# Patient Record
Sex: Female | Born: 1970
Health system: Southern US, Community
[De-identification: ages and names within clinical notes are randomized; demographics above are authoritative.]

## PROBLEM LIST (undated history)

## (undated) DIAGNOSIS — E785 Hyperlipidemia, unspecified: Secondary | ICD-10-CM

## (undated) DIAGNOSIS — K649 Unspecified hemorrhoids: Secondary | ICD-10-CM

## (undated) DIAGNOSIS — Z309 Encounter for contraceptive management, unspecified: Secondary | ICD-10-CM

## (undated) DIAGNOSIS — I10 Essential (primary) hypertension: Secondary | ICD-10-CM

## (undated) DIAGNOSIS — Z0000 Encounter for general adult medical examination without abnormal findings: Secondary | ICD-10-CM

## (undated) DIAGNOSIS — K219 Gastro-esophageal reflux disease without esophagitis: Secondary | ICD-10-CM

## (undated) DIAGNOSIS — T7840XA Allergy, unspecified, initial encounter: Secondary | ICD-10-CM

## (undated) DIAGNOSIS — R739 Hyperglycemia, unspecified: Secondary | ICD-10-CM

## (undated) DIAGNOSIS — Z8632 Personal history of gestational diabetes: Secondary | ICD-10-CM

## (undated) HISTORY — DX: Gastro-esophageal reflux disease without esophagitis: K21.9

## (undated) HISTORY — DX: Personal history of gestational diabetes: Z86.32

## (undated) HISTORY — DX: Allergy, unspecified, initial encounter: T78.40XA

## (undated) HISTORY — DX: Encounter for general adult medical examination without abnormal findings: Z00.00

## (undated) HISTORY — DX: Encounter for contraceptive management, unspecified: Z30.9

## (undated) HISTORY — DX: Unspecified hemorrhoids: K64.9

## (undated) HISTORY — PX: NO PAST SURGERIES: SHX2092

## (undated) HISTORY — DX: Hyperlipidemia, unspecified: E78.5

## (undated) HISTORY — DX: Hyperglycemia, unspecified: R73.9

## (undated) HISTORY — DX: Essential (primary) hypertension: I10

---

## 2005-09-20 ENCOUNTER — Other Ambulatory Visit: Admission: RE | Admit: 2005-09-20 | Discharge: 2005-09-20 | Payer: Self-pay | Admitting: Family Medicine

## 2006-09-12 ENCOUNTER — Other Ambulatory Visit: Admission: RE | Admit: 2006-09-12 | Discharge: 2006-09-12 | Payer: Self-pay | Admitting: Obstetrics and Gynecology

## 2007-01-12 ENCOUNTER — Encounter: Admission: RE | Admit: 2007-01-12 | Discharge: 2007-04-12 | Payer: Self-pay | Admitting: Obstetrics and Gynecology

## 2007-02-23 ENCOUNTER — Inpatient Hospital Stay (HOSPITAL_COMMUNITY): Admission: AD | Admit: 2007-02-23 | Discharge: 2007-02-25 | Payer: Self-pay | Admitting: Obstetrics and Gynecology

## 2007-08-10 ENCOUNTER — Ambulatory Visit (HOSPITAL_COMMUNITY): Admission: RE | Admit: 2007-08-10 | Discharge: 2007-08-10 | Payer: Self-pay | Admitting: Occupational Medicine

## 2011-03-12 NOTE — H&P (Signed)
NAMEVIRGIA, Vickie Thompson                ACCOUNT NO.:  [id number]   MEDICAL RECORD NO.:  [id number]          PATIENT TYPE:  INP   LOCATION:  9161                          FACILITY:  [facility]   PHYSICIAN:  Hal Morales, M.D.DATE OF BIRTH:  [birth year]   DATE OF ADMISSION:  02/23/2007  DATE OF DISCHARGE:                              HISTORY & PHYSICAL   This 40 year old gravida 2, para 1-0-0-1 at 38-1/7 weeks who presents  for induction of labor for oligohydramnios.  She had decelerations on  her NST in the office and although her BPP score was 8/8, she showed an  AFI of the 5th percentile.  Her induction of labor was originally  scheduled for tomorrow.  Pregnancy has been followed by Dr. Stefano Gaul and  remarkable for:  1. Unsure dates.  2. AMA.  3. Gestational diabetes.  4. Group B Strep positive.   ALLERGIES:  SUDAFED causes heart racing.   OBSTETRICAL HISTORY:  Vaginal delivery in 1999 of a female infant at [redacted]  weeks gestation weighing 8 pounds 5 ounces with no complications.   PAST MEDICAL HISTORY:  1. Varicella as a Archivist.  2. Hepatitis A as a child.   PAST SURGICAL HISTORY:  Negative.   FAMILY HISTORY:  Remarkable for a grandmother with hypertension.  Daughter with tuberculosis at age 56 which was treated.  Father with  diabetes.   GENETIC HISTORY:  Remarkable for father of the baby's sister with twins.   SOCIAL HISTORY:  Patient is married to Quay Burow who is involved and  supportive.  She is of the [employer].  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  She denies any alcohol, tobacco or drug use.   PRENATAL LABORATORY DATA:  Hemoglobin 12.9, platelets 286.  Blood type O  positive.  Antibody screen negative.  Sickle cell negative.  RPR  nonreactive.  Rubella immune.  Hepatitis negative.  Pap test normal.  Gonorrhea negative.  Chlamydia negative.   HISTORY OF CURRENT PREGNANCY:  Patient entered care at [redacted] weeks  gestation.  She had an ultrasound  for dates which showed [redacted] weeks  gestation, otherwise was normal.  She declined an amniocentesis.  She  had a Glucola at 26 weeks which was normal.  She had an elevated  Dextrostick at 29 weeks of 236 and was ordered to have two hour fasting  blood sugars.  Fasting blood sugar was 136 and two hour postprandials  were 94-114.  Therefore, gestational diabetes was suspected.  She was  sent to [facility]. [facility] for diet and  insulin teaching.  She had an ultrasound at 30 weeks showing 95 to 96%  growth and normal fluid.  She was placed on 5 units of NPH insulin  nightly.  Fasting blood sugars then ranged 78-81 with two-hour PCs 85-  124.  Ultrasound at 33 weeks showed 83% growth and normal fluid.  BPP  was 8.8.  Blood sugars remained in good control after that.  She had an  ultrasound at 36 weeks showing 84 percentile growth with normal fluid.  Induction was scheduled.  Group B Strep was positive at that time.   OBJECTIVE:  VITAL SIGNS:  Stable, afebrile.  HEENT:  Within normal limits.  NECK:  Thyroid normal, not enlarged.  CHEST:  Clear to auscultation.  CARDIOVASCULAR:  Regular rate and rhythm.  ABDOMEN:  Gravid 39 cm, vertex to Leopold's.  CFM shows reactive fetal  heart rate with contractions every five minutes which are mild.  PELVIC:  Cervix in the office was 2, 75 and high per Dr. Stefano Gaul.  EXTREMITIES:  Within normal limits.   ASSESSMENT:  1. Intrauterine pregnancy at 38-1/7 weeks.  2. Gestational diabetes.  3. Oligohydramnios.   PLAN:  1. Admit to birthing suits per Dr. Stefano Gaul and Dr. Pennie Rushing.  2. Routine MD orders.  3. Cervidil and then pitocin in the a.m.  4. Blood glucose monitoring with NPH insulin 5 units tonight and      further just to follow.      Marie L. Williams, C.N.M.      Hal Morales, M.D.  Electronically Signed    MLW/MEDQ  D:  02/23/2007  T:  02/23/2007  Job:  [id number]

## 2011-12-27 ENCOUNTER — Telehealth: Payer: Self-pay | Admitting: Internal Medicine

## 2011-12-27 NOTE — Telephone Encounter (Signed)
Pt needs a call to see if her physical be done on her first visit; pt has an appointment on 02/07/2012 at 945; she was advise a phone will be made to her to make this determination. If you are unable to reach her at home please call pt on her cell phone: [phone]

## 2011-12-27 NOTE — Telephone Encounter (Signed)
Left message for Jericca to return call to the office

## 2011-12-27 NOTE — Telephone Encounter (Signed)
Spoke with Vickie Thompson.  She denies any major medical problems, takes only multivitamin and OCP daily.  Would need pap and mammogram scheduled day of appt.  Aware okay for CPE day of 1st visit

## 2012-02-07 ENCOUNTER — Ambulatory Visit (HOSPITAL_BASED_OUTPATIENT_CLINIC_OR_DEPARTMENT_OTHER)
Admission: RE | Admit: 2012-02-07 | Discharge: 2012-02-07 | Disposition: A | Discharge status: Home / Self Care | Payer: 59 | Source: Ambulatory Visit | Attending: Internal Medicine | Admitting: Internal Medicine

## 2012-02-07 ENCOUNTER — Encounter: Payer: Self-pay | Admitting: Internal Medicine

## 2012-02-07 ENCOUNTER — Other Ambulatory Visit: Payer: Self-pay | Admitting: Internal Medicine

## 2012-02-07 ENCOUNTER — Ambulatory Visit (INDEPENDENT_AMBULATORY_CARE_PROVIDER_SITE_OTHER): Payer: 59 | Admitting: Internal Medicine

## 2012-02-07 VITALS — BP 111/72 | HR 86 | Temp 99.2°F | Ht 60.5 in | Wt 124.2 lb

## 2012-02-07 DIAGNOSIS — E785 Hyperlipidemia, unspecified: Secondary | ICD-10-CM

## 2012-02-07 DIAGNOSIS — Z124 Encounter for screening for malignant neoplasm of cervix: Secondary | ICD-10-CM

## 2012-02-07 DIAGNOSIS — Z139 Encounter for screening, unspecified: Secondary | ICD-10-CM

## 2012-02-07 DIAGNOSIS — Z1231 Encounter for screening mammogram for malignant neoplasm of breast: Secondary | ICD-10-CM

## 2012-02-07 DIAGNOSIS — Z1151 Encounter for screening for human papillomavirus (HPV): Secondary | ICD-10-CM

## 2012-02-07 DIAGNOSIS — Z8632 Personal history of gestational diabetes: Secondary | ICD-10-CM | POA: Insufficient documentation

## 2012-02-07 LAB — POCT URINALYSIS DIPSTICK
Glucose, UA: NEGATIVE
Ketones, UA: NEGATIVE
Leukocytes, UA: NEGATIVE
Spec Grav, UA: 1.01
Urobilinogen, UA: 0.2
pH, UA: 7.5

## 2012-02-07 LAB — COMPREHENSIVE METABOLIC PANEL
ALT: 12 U/L (ref 0–35)
AST: 18 U/L (ref 0–37)
Albumin: 4.5 g/dL (ref 3.5–5.2)
Chloride: 102 mEq/L (ref 96–112)
Potassium: 3.9 mEq/L (ref 3.5–5.3)
Sodium: 139 mEq/L (ref 135–145)

## 2012-02-07 LAB — CBC WITH DIFFERENTIAL/PLATELET
Eosinophils Relative: 1 % (ref 0–5)
HCT: 41.6 % (ref 36.0–46.0)
Hemoglobin: 13.5 g/dL (ref 12.0–15.0)
Lymphocytes Relative: 20 % (ref 12–46)
Lymphs Abs: 2.3 10*3/uL (ref 0.7–4.0)
MCHC: 32.5 g/dL (ref 30.0–36.0)
Monocytes Absolute: 1 10*3/uL (ref 0.1–1.0)
Neutrophils Relative %: 71 % (ref 43–77)
RBC: 4.56 MIL/uL (ref 3.87–5.11)
RDW: 13.2 % (ref 11.5–15.5)

## 2012-02-07 LAB — LIPID PANEL
LDL Cholesterol: 143 mg/dL — ABNORMAL HIGH (ref 0–99)
VLDL: 25 mg/dL (ref 0–40)

## 2012-02-07 LAB — TSH: TSH: 2.143 u[IU]/mL (ref 0.350–4.500)

## 2012-02-07 NOTE — Patient Instructions (Signed)
Labs will be mailed to you  Return prn 

## 2012-02-07 NOTE — Progress Notes (Addendum)
Subjective:    Patient ID: Vickie Thompson, female    DOB: 1971-08-09, 41 y.o.   MRN: [id number]  HPI Vickie Thompson is a new patient here for her first visit. Former primary care by Dr. Celene Skeen states. Past medical history of hyperlipidemia and gestational diabetes and otherwise healthy. She would like a complete physical exam today. She has no complaints but is worried that her cholesterol may be high and it has not been checked in a while.  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  Allergies  Allergen Reactions  . Retinoids Shortness Of Breath  . Sudafed (Pseudoephedrine Hcl) Palpitations   Past Medical History  Diagnosis Date  . Hyperlipidemia   . Gestational diabetes    History reviewed. No pertinent past surgical history. History   Social History  . Marital Status: Married    Spouse Name: N/A    Number of Children: N/A  . Years of Education: N/A   Occupational History  . Not on file.   Social History Main Topics  . Smoking status: Never Smoker   . Smokeless tobacco: Never Used  . Alcohol Use: No  . Drug Use: No  . Sexually Active: Yes    Birth Control/ Protection: Pill   Other Topics Concern  . Not on file   Social History Narrative  . No narrative on file   Family History  Problem Relation Age of Onset  . Diabetes Father    Patient Active Problem List  Diagnoses  . Hyperlipidemia  . Gestational diabetes   No current outpatient prescriptions on file prior to visit.        Review of Systems  Constitutional: Negative.   HENT: Negative.   Eyes: Negative.   Respiratory: Negative.   Cardiovascular: Negative.   Gastrointestinal: Negative.   Genitourinary: Negative.   Musculoskeletal: Negative.   Skin: Negative.   Neurological: Negative.   Hematological: Negative.        Objective:   Physical Exam  Physical Exam  Vital signs and nursing note reviewed  Constitutional: She is oriented to person, place, and time. She appears  well-developed and well-nourished. She is cooperative.  HENT:  Head: Normocephalic and atraumatic.  Right Ear: Tympanic membrane normal.  Left Ear: Tympanic membrane normal.  Nose: Nose normal.  Mouth/Throat: Oropharynx is clear and moist and mucous membranes are normal. No oropharyngeal exudate or posterior oropharyngeal erythema.  Eyes: Conjunctivae and EOM are normal. Pupils are equal, round, and reactive to light.  Neck: Neck supple. No JVD present. Carotid bruit is not present. No mass and no thyromegaly present.  Cardiovascular: Regular rhythm, normal heart sounds, intact distal pulses and normal pulses.  Exam reveals no gallop and no friction rub.   No murmur heard. Pulses:      Dorsalis pedis pulses are 2+ on the right side, and 2+ on the left side.  Pulmonary/Chest: Breath sounds normal. She has no wheezes. She has no rhonchi. She has no rales. Right breast exhibits no mass, no nipple discharge and no skin change. Left breast exhibits no mass, no nipple discharge and no skin change.  Abdominal: Soft. Bowel sounds are normal. She exhibits no distension and no mass. There is no hepatosplenomegaly. There is no tenderness. There is no CVA tenderness.  Genitourinary: Rectum normal, vagina normal and uterus normal. No labial fusion. There is no lesion on the right labia. There is no lesion on the left labia. Cervix exhibits no motion tenderness. Right adnexum displays no mass, no  tenderness and no fullness. Left adnexum displays no mass, no tenderness and no fullness. No erythema around the vagina.  Musculoskeletal:       No active synovitis to any joint.    Lymphadenopathy:       Right cervical: No superficial cervical adenopathy present.      Left cervical: No superficial cervical adenopathy present.       Right axillary: No pectoral and no lateral adenopathy present.       Left axillary: No pectoral and no lateral adenopathy present.      Right: No inguinal adenopathy present.        Left: No inguinal adenopathy present.  Neurological: She is alert and oriented to person, place, and time. She has normal strength and normal reflexes. No cranial nerve deficit or sensory deficit. She displays a negative Romberg sign. Coordination and gait normal.  Skin: Skin is warm and dry. No abrasion, no bruising, no ecchymosis and no rash noted. No cyanosis. Nails show no clubbing.  Psychiatric: She has a normal mood and affect. Her speech is normal and behavior is normal.          Assessment & Plan:   #1) health maintenance CT scan health maintenance sheet. She is up to date on her TD and was given a copy of-diet.  #2) hyperlipidemia will check lipids with baseline chemistry CBC thyroid and vitamin D daily labs will be mailed to her  She is to come back as needed. Addendum :  See lipids  Old chart LDL 191 in 04/2010  151 09/2009,  Vitamin D 09/2009 13      Assessment & Plan:

## 2012-02-08 LAB — VITAMIN D 25 HYDROXY (VIT D DEFICIENCY, FRACTURES): Vit D, 25-Hydroxy: 49 ng/mL (ref 30–89)

## 2012-02-09 ENCOUNTER — Telehealth: Payer: Self-pay | Admitting: *Deleted

## 2012-02-09 NOTE — Telephone Encounter (Signed)
LM for pt to call and schedule appt to discuss the results of her Cholesterol level and intervention.  A copy of labs mailed to pt.

## 2012-02-14 ENCOUNTER — Encounter: Payer: Self-pay | Admitting: *Deleted

## 2012-02-20 ENCOUNTER — Encounter: Payer: Self-pay | Admitting: Internal Medicine

## 2012-02-20 DIAGNOSIS — E559 Vitamin D deficiency, unspecified: Secondary | ICD-10-CM | POA: Insufficient documentation

## 2012-02-20 HISTORY — DX: Vitamin D deficiency, unspecified: E55.9

## 2012-02-24 ENCOUNTER — Ambulatory Visit (INDEPENDENT_AMBULATORY_CARE_PROVIDER_SITE_OTHER): Payer: 59 | Admitting: Internal Medicine

## 2012-02-24 ENCOUNTER — Encounter: Payer: Self-pay | Admitting: Internal Medicine

## 2012-02-24 VITALS — BP 100/60 | HR 83 | Temp 97.5°F | Resp 16 | Ht 65.0 in | Wt 126.0 lb

## 2012-02-24 DIAGNOSIS — E785 Hyperlipidemia, unspecified: Secondary | ICD-10-CM

## 2012-02-24 DIAGNOSIS — E559 Vitamin D deficiency, unspecified: Secondary | ICD-10-CM

## 2012-02-24 NOTE — Patient Instructions (Signed)
See me as needed 

## 2012-02-24 NOTE — Progress Notes (Signed)
  Subjective:    Patient ID: Vickie Thompson, female    DOB: August 17, 1971, 41 y.o.   MRN: [id number]  HPI [name] is here to follow up on her cholesterol.  She denies FH of MI, CAD or hyperlipidemia.    She does not smoke and is not diabetic  Framingham risk score is 1.6%  Reviewed with pt  She is taking her vitamin D Allergies  Allergen Reactions  . Retinoids Shortness Of Breath  . Sudafed (Pseudoephedrine Hcl) Palpitations   Past Medical History  Diagnosis Date  . Hyperlipidemia   . Gestational diabetes    History reviewed. No pertinent past surgical history. History   Social History  . Marital Status: Married    Spouse Name: N/A    Number of Children: N/A  . Years of Education: N/A   Occupational History  . Not on file.   Social History Main Topics  . Smoking status: Never Smoker   . Smokeless tobacco: Never Used  . Alcohol Use: No  . Drug Use: No  . Sexually Active: Yes -- Female partner(s)    Birth Control/ Protection: Pill   Other Topics Concern  . Not on file   Social History Narrative  . No narrative on file   Family History  Problem Relation Age of Onset  . Diabetes Father    Patient Active Problem List  Diagnoses  . Hyperlipidemia  . Gestational diabetes  . Vitamin d deficiency   Current Outpatient Prescriptions on File Prior to Visit  Medication Sig Dispense Refill  . acetaminophen (TYLENOL) 500 MG tablet Take 500 mg by mouth every 6 (six) hours as needed.      Marland Kitchen PRESCRIPTION MEDICATION 1 tablet daily. Mercilon( birth control from [place])           Review of Systems    see HPI Objective:   Physical Exam Physical Exam  Nursing note and vitals reviewed.  Constitutional: She is oriented to person, place, and time. She appears well-developed and well-nourished.  HENT:  Head: Normocephalic and atraumatic.  Cardiovascular: Normal rate and regular rhythm. Exam reveals no gallop and no friction rub.  No murmur heard.  Pulmonary/Chest:  Breath sounds normal. She has no wheezes. She has no rales.  Neurological: She is alert and oriented to person, place, and time.  Skin: Skin is warm and dry.  Psychiatric: She has a normal mood and affect. Her behavior is normal.         Assessment & Plan:  1)  Hyperlipidemia  Mild  DASH diet given 2)  Vitmain D defiency  Continue supplement

## 2012-11-09 ENCOUNTER — Encounter: Payer: Self-pay | Admitting: Internal Medicine

## 2012-11-09 ENCOUNTER — Ambulatory Visit (INDEPENDENT_AMBULATORY_CARE_PROVIDER_SITE_OTHER): Payer: 59 | Admitting: Internal Medicine

## 2012-11-09 VITALS — BP 108/56 | HR 69 | Temp 97.4°F | Resp 16 | Wt 116.0 lb

## 2012-11-09 DIAGNOSIS — J069 Acute upper respiratory infection, unspecified: Secondary | ICD-10-CM

## 2012-11-09 DIAGNOSIS — J029 Acute pharyngitis, unspecified: Secondary | ICD-10-CM

## 2012-11-09 DIAGNOSIS — Z139 Encounter for screening, unspecified: Secondary | ICD-10-CM

## 2012-11-09 MED ORDER — AZITHROMYCIN 250 MG PO TABS: ORAL_TABLET | ORAL | Status: DC

## 2012-11-09 NOTE — Patient Instructions (Addendum)
See me as needed 

## 2012-11-09 NOTE — Progress Notes (Signed)
  Subjective:    Patient ID: Vickie Thompson, female    DOB: [birth year], 42 y.o.   MRN: [id number]  HPI  Prestyn is here for acute visit.  Had GI diarrhea that is resolved now but has sore throat and swollen lymph nodes in neck. NO fever no cough  Allergies  Allergen Reactions  . Retinoids Shortness Of Breath  . Sudafed (Pseudoephedrine Hcl) Palpitations   Past Medical History  Diagnosis Date  . Hyperlipidemia   . Gestational diabetes    History reviewed. No pertinent past surgical history. History   Social History  . Marital Status: Married    Spouse Name: N/A    Number of Children: N/A  . Years of Education: N/A   Occupational History  . Not on file.   Social History Main Topics  . Smoking status: Never Smoker   . Smokeless tobacco: Never Used  . Alcohol Use: No  . Drug Use: No  . Sexually Active: Yes -- Female partner(s)    Birth Control/ Protection: Pill   Other Topics Concern  . Not on file   Social History Narrative  . No narrative on file   Family History  Problem Relation Age of Onset  . Diabetes Father    Patient Active Problem List  Diagnosis  . Hyperlipidemia  . Gestational diabetes  . Vitamin d deficiency   Current Outpatient Prescriptions on File Prior to Visit  Medication Sig Dispense Refill  . acetaminophen (TYLENOL) 500 MG tablet Take 500 mg by mouth every 6 (six) hours as needed.      Marland Kitchen PRESCRIPTION MEDICATION 1 tablet daily. Mercilon( birth control from [place])         Review of Systems    see HPI Objective:   Physical Exam  Physical Exam  Constitutional: She is oriented to person, place, and time. She appears well-developed and well-nourished. She is cooperative.  HENT:  Head: Normocephalic and atraumatic.  Right Ear: A middle ear effusion is present.  Left Ear: A middle ear effusion is present.  Nose: Mucosal edema present.  Mouth/Throat: Oropharyngeal exudate and posterior oropharyngeal erythema present.  Serous  effusion bilaterally  Eyes: Conjunctivae and EOM are normal. Pupils are equal, round, and reactive to light.  Neck: Neck supple. Carotid bruit is not present. No mass present.  Cardiovascular: Regular rhythm, normal heart sounds, intact distal pulses and normal pulses. Exam reveals no gallop and no friction rub.  No murmur heard.  Pulmonary/Chest: Breath sounds normal. She has no wheezes. She has no rhonchi. She has no rales.  Lymphadenopathy:  She has cervical adenopathy.  Neurological: She is alert and oriented to person, place, and time.  Skin: Skin is warm and dry. No abrasion, no bruising, no ecchymosis and no rash noted. No cyanosis. Nails show no clubbing.  Psychiatric: She has a normal mood and affect. Her speech is normal and behavior is normal.           Assessment & Plan:  Pharyngitis  Will give Zpak  Call if not better  URI  See above

## 2012-11-21 ENCOUNTER — Telehealth: Payer: Self-pay | Admitting: *Deleted

## 2012-11-21 ENCOUNTER — Ambulatory Visit (HOSPITAL_BASED_OUTPATIENT_CLINIC_OR_DEPARTMENT_OTHER)
Admission: RE | Admit: 2012-11-21 | Discharge: 2012-11-21 | Disposition: A | Discharge status: Home / Self Care | Payer: 59 | Source: Ambulatory Visit | Attending: Internal Medicine | Admitting: Internal Medicine

## 2012-11-21 ENCOUNTER — Encounter: Payer: Self-pay | Admitting: Internal Medicine

## 2012-11-21 ENCOUNTER — Other Ambulatory Visit: Payer: Self-pay | Admitting: Internal Medicine

## 2012-11-21 ENCOUNTER — Ambulatory Visit (INDEPENDENT_AMBULATORY_CARE_PROVIDER_SITE_OTHER): Payer: 59 | Admitting: Internal Medicine

## 2012-11-21 DIAGNOSIS — M546 Pain in thoracic spine: Secondary | ICD-10-CM | POA: Insufficient documentation

## 2012-11-21 DIAGNOSIS — M549 Dorsalgia, unspecified: Secondary | ICD-10-CM | POA: Insufficient documentation

## 2012-11-21 DIAGNOSIS — R0781 Pleurodynia: Secondary | ICD-10-CM

## 2012-11-21 DIAGNOSIS — R079 Chest pain, unspecified: Secondary | ICD-10-CM | POA: Insufficient documentation

## 2012-11-21 MED ORDER — IBUPROFEN 800 MG PO TABS: 800.0000 mg | ORAL_TABLET | Freq: Three times a day (TID) | ORAL | Status: DC | PRN
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
## 2012-11-21 NOTE — Telephone Encounter (Signed)
Called patient to adv xrays are normal. Patient understood

## 2012-11-21 NOTE — Patient Instructions (Addendum)
To xray today  Ok to take Ibuprofen 800 mg q6h prn

## 2012-11-24 ENCOUNTER — Telehealth: Payer: Self-pay | Admitting: *Deleted

## 2012-11-24 NOTE — Telephone Encounter (Signed)
Pt states that she continues to have pain in her right chest area and back states that she is taking  ibuprofen 800 mg with no relief requesting something stronger for pain

## 2012-11-25 MED ORDER — TRAMADOL-ACETAMINOPHEN 37.5-325 MG PO TABS: 1.0000 | ORAL_TABLET | Freq: Four times a day (QID) | ORAL | Status: DC | PRN

## 2012-11-25 NOTE — Addendum Note (Signed)
Addended by: Raechel Chute D on: 11/25/2012 11:01 AM   Modules accepted: Orders

## 2012-11-25 NOTE — Telephone Encounter (Signed)
Called pt regarding request for meds .  Pt not available.  Left message on cell and home phone

## 2012-11-27 ENCOUNTER — Telehealth: Payer: Self-pay | Admitting: Internal Medicine

## 2012-11-27 NOTE — Telephone Encounter (Signed)
Pt was calling to have another pain medication prescribe to her.. She states her meds are not working for her.. Please call pt at [phone] she called on fri at 140 pm..

## 2012-11-27 NOTE — Telephone Encounter (Signed)
Spoke with pt.  OK for ultracet 37.5 q6h prn

## 2012-12-05 ENCOUNTER — Telehealth: Payer: Self-pay | Admitting: *Deleted

## 2012-12-13 NOTE — Telephone Encounter (Signed)
refill 

## 2013-02-12 ENCOUNTER — Encounter: Payer: 59 | Admitting: Internal Medicine

## 2013-06-18 ENCOUNTER — Ambulatory Visit (HOSPITAL_BASED_OUTPATIENT_CLINIC_OR_DEPARTMENT_OTHER)
Admission: RE | Admit: 2013-06-18 | Discharge: 2013-06-18 | Disposition: A | Discharge status: Home / Self Care | Payer: 59 | Source: Ambulatory Visit | Attending: Internal Medicine | Admitting: Internal Medicine

## 2013-06-18 ENCOUNTER — Ambulatory Visit (INDEPENDENT_AMBULATORY_CARE_PROVIDER_SITE_OTHER): Payer: 59 | Admitting: Internal Medicine

## 2013-06-18 ENCOUNTER — Encounter: Payer: Self-pay | Admitting: Internal Medicine

## 2013-06-18 VITALS — BP 110/70 | HR 99 | Temp 98.2°F | Resp 18 | Wt 124.0 lb

## 2013-06-18 DIAGNOSIS — E785 Hyperlipidemia, unspecified: Secondary | ICD-10-CM

## 2013-06-18 DIAGNOSIS — E559 Vitamin D deficiency, unspecified: Secondary | ICD-10-CM

## 2013-06-18 DIAGNOSIS — Z0000 Encounter for general adult medical examination without abnormal findings: Secondary | ICD-10-CM

## 2013-06-18 DIAGNOSIS — Z1231 Encounter for screening mammogram for malignant neoplasm of breast: Secondary | ICD-10-CM | POA: Insufficient documentation

## 2013-06-18 DIAGNOSIS — Z139 Encounter for screening, unspecified: Secondary | ICD-10-CM

## 2013-06-18 LAB — POCT URINALYSIS DIPSTICK
Glucose, UA: NEGATIVE
Leukocytes, UA: NEGATIVE
Nitrite, UA: NEGATIVE
Spec Grav, UA: 1.015
Urobilinogen, UA: NEGATIVE

## 2013-06-18 LAB — CBC WITH DIFFERENTIAL/PLATELET
Basophils Relative: 1 % (ref 0–1)
Eosinophils Relative: 18 % — ABNORMAL HIGH (ref 0–5)
Lymphs Abs: 2.1 10*3/uL (ref 0.7–4.0)
Monocytes Absolute: 0.3 10*3/uL (ref 0.1–1.0)
Monocytes Relative: 5 % (ref 3–12)
Neutrophils Relative %: 44 % (ref 43–77)
RBC: 4.47 MIL/uL (ref 3.87–5.11)
WBC: 6.5 10*3/uL (ref 4.0–10.5)

## 2013-06-18 NOTE — Patient Instructions (Addendum)
Activate my chart  See me as needed
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
## 2013-06-19 LAB — COMPREHENSIVE METABOLIC PANEL WITH GFR
ALT: 15 U/L (ref 0–35)
AST: 20 U/L (ref 0–37)
Albumin: 4.6 g/dL (ref 3.5–5.2)
Alkaline Phosphatase: 57 U/L (ref 39–117)
BUN: 15 mg/dL (ref 6–23)
CO2: 25 meq/L (ref 19–32)
Calcium: 9.4 mg/dL (ref 8.4–10.5)
Chloride: 104 meq/L (ref 96–112)
Creat: 0.61 mg/dL (ref 0.50–1.10)
Glucose, Bld: 81 mg/dL (ref 70–99)
Potassium: 3.8 meq/L (ref 3.5–5.3)
Sodium: 138 meq/L (ref 135–145)
Total Bilirubin: 0.4 mg/dL (ref 0.3–1.2)
Total Protein: 7.6 g/dL (ref 6.0–8.3)

## 2013-06-19 LAB — LIPID PANEL
Cholesterol: 248 mg/dL — ABNORMAL HIGH (ref 0–200)
HDL: 69 mg/dL
LDL Cholesterol: 158 mg/dL — ABNORMAL HIGH (ref 0–99)
Total CHOL/HDL Ratio: 3.6 ratio
Triglycerides: 104 mg/dL
VLDL: 21 mg/dL (ref 0–40)

## 2013-06-19 LAB — TSH: TSH: 2.446 u[IU]/mL (ref 0.350–4.500)

## 2013-06-26 ENCOUNTER — Telehealth: Payer: Self-pay | Admitting: *Deleted

## 2013-06-26 ENCOUNTER — Encounter: Payer: Self-pay | Admitting: *Deleted

## 2013-06-26 NOTE — Telephone Encounter (Signed)
Message copied by Mathews Robinsons on Tue Jun 26, 2013  3:36 PM ------      Message from: Raechel Chute D      Created: Sat Jun 23, 2013  9:36 PM       Karen Kitchens            Call pt and let her know that her cholesterol is slightly higher .  Tell her I need to see her in office to discuss options.  Give her a 30 min appt with me ------

## 2013-06-26 NOTE — Telephone Encounter (Signed)
appt made for Wed 9/10

## 2013-07-04 ENCOUNTER — Ambulatory Visit (INDEPENDENT_AMBULATORY_CARE_PROVIDER_SITE_OTHER): Payer: 59 | Admitting: Internal Medicine

## 2013-07-04 ENCOUNTER — Encounter: Payer: Self-pay | Admitting: Internal Medicine

## 2013-07-04 VITALS — BP 99/62 | HR 76 | Temp 98.2°F | Resp 16 | Wt 125.0 lb

## 2013-07-04 DIAGNOSIS — E785 Hyperlipidemia, unspecified: Secondary | ICD-10-CM

## 2013-07-04 DIAGNOSIS — E559 Vitamin D deficiency, unspecified: Secondary | ICD-10-CM
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
## 2014-06-17 ENCOUNTER — Other Ambulatory Visit: Payer: Self-pay | Admitting: Internal Medicine

## 2014-06-17 DIAGNOSIS — Z1231 Encounter for screening mammogram for malignant neoplasm of breast: Secondary | ICD-10-CM

## 2014-06-24 ENCOUNTER — Ambulatory Visit (HOSPITAL_BASED_OUTPATIENT_CLINIC_OR_DEPARTMENT_OTHER): Payer: 59

## 2014-07-08 ENCOUNTER — Ambulatory Visit (HOSPITAL_BASED_OUTPATIENT_CLINIC_OR_DEPARTMENT_OTHER)
Admission: RE | Admit: 2014-07-08 | Discharge: 2014-07-08 | Disposition: A | Discharge status: Home / Self Care | Payer: 59 | Source: Ambulatory Visit | Attending: Internal Medicine | Admitting: Internal Medicine

## 2014-07-08 DIAGNOSIS — Z1231 Encounter for screening mammogram for malignant neoplasm of breast: Secondary | ICD-10-CM | POA: Diagnosis not present

## 2014-08-05 ENCOUNTER — Emergency Department
Admission: EM | Admit: 2014-08-05 | Discharge: 2014-08-05 | Disposition: A | Discharge status: Home / Self Care | Payer: 59 | Source: Home / Self Care | Attending: Emergency Medicine | Admitting: Emergency Medicine

## 2014-08-05 ENCOUNTER — Encounter: Payer: Self-pay | Admitting: Emergency Medicine

## 2014-08-05 DIAGNOSIS — K2100 Gastro-esophageal reflux disease with esophagitis, without bleeding: Secondary | ICD-10-CM

## 2014-08-05 MED ORDER — OMEPRAZOLE 20 MG PO CPDR: 20.0000 mg | DELAYED_RELEASE_CAPSULE | Freq: Every day | ORAL | Status: DC

## 2014-08-05 MED ORDER — GI COCKTAIL ~~LOC~~
30.0000 mL | Freq: Once | ORAL | Status: AC
  Administered 2014-08-05: 30 mL via ORAL

## 2014-08-05 NOTE — ED Notes (Signed)
Pt reports relief of s/s after taking the GI cocktail. Charna Archer, LPN

## 2014-08-05 NOTE — ED Provider Notes (Signed)
CSN: [id number]     Arrival date & time 08/05/14  0944 History   First MD Initiated Contact with Patient 08/05/14 1010     Chief Complaint  Patient presents with  . Choking   (Consider location/radiation/quality/duration/timing/severity/associated sxs/prior Treatment) Patient is a 43 y.o. female presenting with pharyngitis. The history is provided by the patient. No language interpreter was used.  Sore Throat This is a new problem. The current episode started yesterday. The problem occurs constantly. The problem has been gradually improving. Associated symptoms include chest pain. Nothing relieves the symptoms. She has tried nothing for the symptoms. The treatment provided no relief.    Past Medical History  Diagnosis Date  . Hyperlipidemia   . Gestational diabetes    History reviewed. No pertinent past surgical history. Family History  Problem Relation Age of Onset  . Diabetes Father   . Hypertension Father    History  Substance Use Topics  . Smoking status: Never Smoker   . Smokeless tobacco: Never Used  . Alcohol Use: Yes   OB History   Grav Para Term Preterm Abortions TAB SAB Ect Mult Living   2 2 2             Review of Systems  Cardiovascular: Positive for chest pain.  All other systems reviewed and are negative.   Allergies  Retinoids and Sudafed  Home Medications   Prior to Admission medications   Medication Sig Start Date End Date Taking? Authorizing Provider  acetaminophen (TYLENOL) 500 MG tablet Take 500 mg by mouth every 6 (six) hours as needed.    Historical Provider, MD  ibuprofen (ADVIL,MOTRIN) 800 MG tablet Take 1 tablet (800 mg total) by mouth every 8 (eight) hours as needed for pain. 11/21/12   Lanice Shirts, MD  omeprazole (PRILOSEC) 20 MG capsule Take 1 capsule (20 mg total) by mouth daily. 08/05/14   Fransico Meadow, PA-C  PRESCRIPTION MEDICATION 1 tablet daily. Mercilon( birth control from [place])    Historical Provider, MD   BP  109/72  Pulse 70  Temp(Src) 98.6 F (37 C) (Oral)  Resp 16  Ht 5' 0.5" (1.537 m)  Wt 133 lb (60.328 kg)  BMI 25.54 kg/m2  SpO2 100%  LMP 07/24/2014 Physical Exam  Nursing note and vitals reviewed. Constitutional: She appears well-developed and well-nourished.  HENT:  Head: Normocephalic and atraumatic.  Right Ear: External ear normal.  Left Ear: External ear normal.  Eyes: Conjunctivae are normal. Pupils are equal, round, and reactive to light.  Neck: Normal range of motion. Neck supple.  Cardiovascular: Normal rate and normal heart sounds.   Pulmonary/Chest: Effort normal and breath sounds normal.  Abdominal: Soft.  Neurological: She is alert.  Skin: Skin is warm.    ED Course  Procedures (including critical care time) Labs Review Labs Reviewed - No data to display  Imaging Review No results found.  EKG  Normal sinus normal ekg  Rate 59 MDM   1. Gastroesophageal reflux disease with esophagitis    Pt reportsresolution with Gi cocktail.   Pt's symptoms sound like GERD/reflux prilosec gerd diet Pt advised to follow up with Dr. Coralyn Mark for recheck in 1 week   Fransico Meadow, PA-C 08/05/14 1118

## 2014-08-05 NOTE — Discharge Instructions (Signed)
Gastroesophageal Reflux Disease, Adult Gastroesophageal reflux disease (GERD) happens when acid from your stomach flows up into the esophagus. When acid comes in contact with the esophagus, the acid causes soreness (inflammation) in the esophagus. Over time, GERD may create small holes (ulcers) in the lining of the esophagus. CAUSES   Increased body weight. This puts pressure on the stomach, making acid rise from the stomach into the esophagus.  Smoking. This increases acid production in the stomach.  Drinking alcohol. This causes decreased pressure in the lower esophageal sphincter (valve or ring of muscle between the esophagus and stomach), allowing acid from the stomach into the esophagus.  Late evening meals and a full stomach. This increases pressure and acid production in the stomach.  A malformed lower esophageal sphincter. Sometimes, no cause is found. SYMPTOMS   Burning pain in the lower part of the mid-chest behind the breastbone and in the mid-stomach area. This may occur twice a week or more often.  Trouble swallowing.  Sore throat.  Dry cough.  Asthma-like symptoms including chest tightness, shortness of breath, or wheezing. DIAGNOSIS  Your caregiver may be able to diagnose GERD based on your symptoms. In some cases, X-rays and other tests may be done to check for complications or to check the condition of your stomach and esophagus. TREATMENT  Your caregiver may recommend over-the-counter or prescription medicines to help decrease acid production. Ask your caregiver before starting or adding any new medicines.  HOME CARE INSTRUCTIONS   Change the factors that you can control. Ask your caregiver for guidance concerning weight loss, quitting smoking, and alcohol consumption.  Avoid foods and drinks that make your symptoms worse, such as:  Caffeine or alcoholic drinks.  Chocolate.  Peppermint or mint flavorings.  Garlic and onions.  Spicy foods.  Citrus fruits,  such as oranges, lemons, or limes.  Tomato-based foods such as sauce, chili, salsa, and pizza.  Fried and fatty foods.  Avoid lying down for the 3 hours prior to your bedtime or prior to taking a nap.  Eat small, frequent meals instead of large meals.  Wear loose-fitting clothing. Do not wear anything tight around your waist that causes pressure on your stomach.  Raise the head of your bed 6 to 8 inches with wood blocks to help you sleep. Extra pillows will not help.  Only take over-the-counter or prescription medicines for pain, discomfort, or fever as directed by your caregiver.  Do not take aspirin, ibuprofen, or other nonsteroidal anti-inflammatory drugs (NSAIDs). SEEK IMMEDIATE MEDICAL CARE IF:   You have pain in your arms, neck, jaw, teeth, or back.  Your pain increases or changes in intensity or duration.  You develop nausea, vomiting, or sweating (diaphoresis).  You develop shortness of breath, or you faint.  Your vomit is green, yellow, black, or looks like coffee grounds or blood.  Your stool is red, bloody, or black. These symptoms could be signs of other problems, such as heart disease, gastric bleeding, or esophageal bleeding. MAKE SURE YOU:   Understand these instructions.  Will watch your condition.  Will get help right away if you are not doing well or get worse. Document Released: 07/21/2005 Document Revised: 01/03/2012 Document Reviewed: 04/30/2011 ExitCare Patient Information 2015 [employer]. This information is not intended to replace advice given to you by your health care provider. Make sure you discuss any questions you have with your health care provider.  

## 2014-08-05 NOTE — ED Notes (Signed)
Pt c/o feeling like she has something stuck in her throat and epigastric pain x last night. She reports taking her husbands protonix with minimal relief.

## 2014-08-08 NOTE — ED Provider Notes (Signed)
Medical history/examination/treatment/procedure(s) were performed by non-physician provider and as supervising physician I was immediately available for consultation/collaboration.  Jacqulyn Cane, MD 08/08/14 [phone]

## 2014-08-26 ENCOUNTER — Encounter: Payer: Self-pay | Admitting: Emergency Medicine

## 2014-10-01 ENCOUNTER — Ambulatory Visit (INDEPENDENT_AMBULATORY_CARE_PROVIDER_SITE_OTHER): Payer: 59 | Admitting: Internal Medicine

## 2014-10-01 ENCOUNTER — Encounter: Payer: Self-pay | Admitting: *Deleted

## 2014-10-01 ENCOUNTER — Encounter: Payer: Self-pay | Admitting: Internal Medicine

## 2014-10-01 VITALS — BP 121/76 | HR 83 | Temp 98.5°F | Resp 16 | Ht 61.0 in | Wt 133.0 lb

## 2014-10-01 DIAGNOSIS — R05 Cough: Secondary | ICD-10-CM

## 2014-10-01 DIAGNOSIS — J209 Acute bronchitis, unspecified: Secondary | ICD-10-CM

## 2014-10-01 DIAGNOSIS — J069 Acute upper respiratory infection, unspecified: Secondary | ICD-10-CM

## 2014-10-01 DIAGNOSIS — R059 Cough, unspecified: Secondary | ICD-10-CM

## 2014-10-01 MED ORDER — AZITHROMYCIN 250 MG PO TABS: ORAL_TABLET | ORAL | Status: DC

## 2014-10-01 MED ORDER — HYDROCOD POLST-CHLORPHEN POLST 10-8 MG/5ML PO LQCR: 5.0000 mL | Freq: Two times a day (BID) | ORAL | Status: DC | PRN

## 2014-10-01 NOTE — Patient Instructions (Signed)
To pharmacy   See me as needed if not better
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
## 2015-01-06 ENCOUNTER — Ambulatory Visit (INDEPENDENT_AMBULATORY_CARE_PROVIDER_SITE_OTHER): Payer: 59 | Admitting: Internal Medicine

## 2015-01-06 ENCOUNTER — Encounter: Payer: Self-pay | Admitting: Internal Medicine

## 2015-01-06 ENCOUNTER — Other Ambulatory Visit: Payer: Self-pay | Admitting: *Deleted

## 2015-01-06 VITALS — BP 130/60 | HR 84 | Resp 16 | Ht 61.0 in | Wt 130.0 lb

## 2015-01-06 DIAGNOSIS — E785 Hyperlipidemia, unspecified: Secondary | ICD-10-CM

## 2015-01-06 DIAGNOSIS — Z0000 Encounter for general adult medical examination without abnormal findings: Secondary | ICD-10-CM

## 2015-01-06 DIAGNOSIS — Z30011 Encounter for initial prescription of contraceptive pills: Secondary | ICD-10-CM

## 2015-01-06 LAB — COMPLETE METABOLIC PANEL WITH GFR
ALT: 14 U/L (ref 0–35)
AST: 20 U/L (ref 0–37)
Albumin: 4.3 g/dL (ref 3.5–5.2)
Alkaline Phosphatase: 66 U/L (ref 39–117)
BILIRUBIN TOTAL: 0.6 mg/dL (ref 0.2–1.2)
BUN: 11 mg/dL (ref 6–23)
CO2: 24 mEq/L (ref 19–32)
Calcium: 9.4 mg/dL (ref 8.4–10.5)
Chloride: 102 mEq/L (ref 96–112)
Creat: 0.63 mg/dL (ref 0.50–1.10)
GFR, Est African American: >89 mL/min
GFR, Est Non African American: >89 mL/min
Glucose, Bld: 94 mg/dL (ref 70–99)
Potassium: 3.7 mEq/L (ref 3.5–5.3)
SODIUM: 140 meq/L (ref 135–145)
Total Protein: 7.3 g/dL (ref 6.0–8.3)

## 2015-01-06 LAB — LIPID PANEL
CHOLESTEROL: 272 mg/dL — AB (ref 0–200)
HDL: 70 mg/dL (ref >=46)
LDL Cholesterol: 176 mg/dL — ABNORMAL HIGH (ref 0–99)
TRIGLYCERIDES: 132 mg/dL (ref <150)
Total CHOL/HDL Ratio: 3.9 Ratio
VLDL: 26 mg/dL (ref 0–40)

## 2015-01-06 LAB — TSH: TSH: 1.853 u[IU]/mL (ref 0.350–4.500)

## 2015-01-06 MED ORDER — LEVONORG-ETH ESTRAD TRIPHASIC PO TABS: 1.0000 | ORAL_TABLET | Freq: Every day | ORAL | Status: DC
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
## 2015-01-07 ENCOUNTER — Encounter: Payer: Self-pay | Admitting: *Deleted

## 2015-01-07 ENCOUNTER — Telehealth: Payer: Self-pay | Admitting: *Deleted

## 2015-01-07 LAB — CBC WITH DIFFERENTIAL/PLATELET
BASOS ABS: 0 10*3/uL (ref 0.0–0.1)
Basophils Relative: 0 % (ref 0–1)
EOS PCT: 1 % (ref 0–5)
Eosinophils Absolute: 0.1 10*3/uL (ref 0.0–0.7)
HCT: 41.1 % (ref 36.0–46.0)
Hemoglobin: 13.5 g/dL (ref 12.0–15.0)
LYMPHS PCT: 21 % (ref 12–46)
Lymphs Abs: 1.5 10*3/uL (ref 0.7–4.0)
MCH: 29.3 pg (ref 26.0–34.0)
MCHC: 32.8 g/dL (ref 30.0–36.0)
MCV: 89.3 fL (ref 78.0–100.0)
MONO ABS: 0.2 10*3/uL (ref 0.1–1.0)
MPV: 9.4 fL (ref 8.6–12.4)
Monocytes Relative: 3 % (ref 3–12)
Neutro Abs: 5.3 10*3/uL (ref 1.7–7.7)
Neutrophils Relative %: 75 % (ref 43–77)
Platelets: 345 10*3/uL (ref 150–400)
RBC: 4.6 MIL/uL (ref 3.87–5.11)
RDW: 13.3 % (ref 11.5–15.5)
WBC: 7 10*3/uL (ref 4.0–10.5)

## 2015-01-07 LAB — VITAMIN D 25 HYDROXY (VIT D DEFICIENCY, FRACTURES): VIT D 25 HYDROXY: 39 ng/mL (ref 30–100)

## 2015-01-07 NOTE — Telephone Encounter (Signed)
Called pt to adv need appointment for follow up cholesterol. LMOM for pt to rtn call. Mailed labs

## 2015-01-07 NOTE — Telephone Encounter (Signed)
-----   Message from Lanice Shirts, MD sent at 01/07/2015  7:55 AM EDT ----- Call pt and let her know that her bad cholesterol is even higher.  Make 30 min appt with me to discuss options  OK to mail to her
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
## 2015-01-13 ENCOUNTER — Encounter: Payer: Self-pay | Admitting: Internal Medicine

## 2015-01-13 ENCOUNTER — Ambulatory Visit (INDEPENDENT_AMBULATORY_CARE_PROVIDER_SITE_OTHER): Payer: 59 | Admitting: Internal Medicine

## 2015-01-13 VITALS — BP 107/63 | HR 75 | Resp 16 | Ht 61.0 in | Wt 133.0 lb

## 2015-01-13 DIAGNOSIS — E785 Hyperlipidemia, unspecified: Secondary | ICD-10-CM

## 2015-04-19 ENCOUNTER — Encounter: Payer: Self-pay | Admitting: Emergency Medicine

## 2015-04-19 ENCOUNTER — Emergency Department
Admission: EM | Admit: 2015-04-19 | Discharge: 2015-04-19 | Disposition: A | Discharge status: Home / Self Care | Payer: 59 | Source: Home / Self Care | Attending: Family Medicine | Admitting: Family Medicine

## 2015-04-19 ENCOUNTER — Emergency Department (INDEPENDENT_AMBULATORY_CARE_PROVIDER_SITE_OTHER): Payer: 59

## 2015-04-19 DIAGNOSIS — S92501A Displaced unspecified fracture of right lesser toe(s), initial encounter for closed fracture: Secondary | ICD-10-CM | POA: Diagnosis not present

## 2015-04-19 DIAGNOSIS — M79674 Pain in right toe(s): Secondary | ICD-10-CM

## 2015-04-19 DIAGNOSIS — X58XXXA Exposure to other specified factors, initial encounter: Secondary | ICD-10-CM | POA: Diagnosis not present

## 2015-04-19 DIAGNOSIS — S92531A Displaced fracture of distal phalanx of right lesser toe(s), initial encounter for closed fracture: Secondary | ICD-10-CM

## 2015-04-19 MED ORDER — MELOXICAM 15 MG PO TABS: 15.0000 mg | ORAL_TABLET | Freq: Every day | ORAL | Status: DC

## 2015-04-19 NOTE — ED Notes (Signed)
Complains or right little toe pain times 3 weeks after striking the toe against a metal jack. Edema noted pain 5/10 and burning in nature.

## 2015-04-19 NOTE — Discharge Instructions (Signed)
Wear sturdy, supportive shoes. The shoes should not pinch the toes or fit tightly against the toes.

## 2015-04-19 NOTE — ED Provider Notes (Signed)
CSN: [id number]     Arrival date & time 04/19/15  1027 History   First MD Initiated Contact with Patient 04/19/15 1054     Chief Complaint  Patient presents with  . Toe Pain      HPI Comments: Patient bumped her right fifth toe on a car jack 3 weeks ago.  Initial swelling resolved, but she has persistent pain distally.  Patient is a 44 y.o. female presenting with toe pain. The history is provided by the patient.  Toe Pain This is a new problem. Episode onset: 3 weeks ago. The problem occurs constantly. The problem has not changed since onset.Exacerbated by: walking and wearing shoes. Nothing relieves the symptoms. Treatments tried: Ice and ibuprofen. The treatment provided mild relief.    Past Medical History  Diagnosis Date  . Hyperlipidemia   . Gestational diabetes    History reviewed. No pertinent past surgical history. Family History  Problem Relation Age of Onset  . Diabetes Father   . Hypertension Father    History  Substance Use Topics  . Smoking status: Never Smoker   . Smokeless tobacco: Never Used  . Alcohol Use: Yes   OB History    Gravida Para Term Preterm AB TAB SAB Ectopic Multiple Living   2 2 2             Review of Systems  All other systems reviewed and are negative.   Allergies  Retinoids and Sudafed  Home Medications   Prior to Admission medications   Medication Sig Start Date End Date Taking? Authorizing Provider  acetaminophen (TYLENOL) 500 MG tablet Take 500 mg by mouth every 6 (six) hours as needed.    Historical Provider, MD  levonorgestrel-ethinyl estradiol (ENPRESSE,TRIVORA) tablet Take 1 tablet by mouth daily. 01/06/15   Lanice Shirts, MD  meloxicam (MOBIC) 15 MG tablet Take 1 tablet (15 mg total) by mouth daily. Take with food each morning 04/19/15   Kandra Nicolas, MD  omeprazole (PRILOSEC) 20 MG capsule Take 1 capsule (20 mg total) by mouth daily. 08/05/14   Fransico Meadow, PA-C   BP 131/87 mmHg  Pulse 86  Temp(Src) 98.7 F  (37.1 C) (Oral)  Resp 16  Ht 5\' 1"  (1.549 m)  Wt 133 lb 8 oz (60.555 kg)  BMI 25.24 kg/m2  SpO2 96%  LMP 03/24/2015 Physical Exam  Constitutional: She is oriented to person, place, and time. She appears well-developed and well-nourished. No distress.  HENT:  Head: Normocephalic.  Eyes: Pupils are equal, round, and reactive to light.  Musculoskeletal:       Right foot: There is decreased range of motion, tenderness and bony tenderness. There is no swelling, normal capillary refill and no deformity.       Feet:  Right fifth toe has tenderness to palpation and mild swelling over the distal phalanx.    Neurological: She is alert and oriented to person, place, and time.  Skin: Skin is dry.  Nursing note and vitals reviewed.   ED Course  Procedures  None   Imaging Review Dg Toe 5th Right  04/19/2015   CLINICAL DATA:  Right fifth toe pain 3 weeks after injury.  EXAM: RIGHT FIFTH TOE  COMPARISON:  None.  FINDINGS: Examination demonstrates fused fifth middle and distal phalanges with mildly displaced fracture through this fused phalanx. Plantar angulation of the predominant distal fragment. Remainder the exam is within normal.  IMPRESSION: Displaced fracture of the fused fifth middle/distal phalanx.   Electronically Signed  By: Marin Olp M.D.   On: 04/19/2015 11:27     MDM   1. Fracture of fifth toe, right, closed, initial encounter   2. Toe pain, right    Rx for Mobic 15mg , one daily. Wear sturdy, supportive shoes. The shoes should not pinch the toes or fit tightly against the toes.  Dispensed a post-op shoe. Followup with Dr. Aundria Mems ([facility]).    Kandra Nicolas, MD 04/19/15 1213

## 2015-04-24 ENCOUNTER — Encounter: Payer: Self-pay | Admitting: Family Medicine

## 2015-04-24 ENCOUNTER — Ambulatory Visit (INDEPENDENT_AMBULATORY_CARE_PROVIDER_SITE_OTHER): Payer: 59 | Admitting: Family Medicine

## 2015-04-24 VITALS — BP 126/73 | HR 69 | Ht 61.0 in | Wt 133.0 lb

## 2015-04-24 DIAGNOSIS — S92501A Displaced unspecified fracture of right lesser toe(s), initial encounter for closed fracture: Secondary | ICD-10-CM | POA: Diagnosis not present

## 2015-04-24 NOTE — Patient Instructions (Signed)
I would expect this to feel better over the next 2-4 weeks. Wear postop shoe as you have been until pain feels well enough to switch to a regular supportive shoe. Consider buddy taping to the next toe if this feels better. The swelling can still persist for several weeks but is not dangerous. Activities as tolerated otherwise. Icing as needed, elevation as needed above your heart for the swelling. Follow up with me in 4 weeks if you're having problems.

## 2015-04-29 DIAGNOSIS — S92501A Displaced unspecified fracture of right lesser toe(s), initial encounter for closed fracture: Secondary | ICD-10-CM | POA: Insufficient documentation

## 2015-04-29 NOTE — Progress Notes (Signed)
PCP: SCHOENHOFF,DEBBIE, MD  Subjective:   HPI: Patient is a 44 y.o. female here for right 5th toe injury.  Patient reports more than 3 weeks ago she accidentally kicked a metal car jack. + swelling and bruising mainly of 5th toe right foot. Has been icing, taking ibuprofen. Hurts to wear sneakers so using post op shoe.  Past Medical History  Diagnosis Date  . Hyperlipidemia   . Gestational diabetes     Current Outpatient Prescriptions on File Prior to Visit  Medication Sig Dispense Refill  . acetaminophen (TYLENOL) 500 MG tablet Take 500 mg by mouth every 6 (six) hours as needed.    Marland Kitchen levonorgestrel-ethinyl estradiol (ENPRESSE,TRIVORA) tablet Take 1 tablet by mouth daily. 1 Package 6  . meloxicam (MOBIC) 15 MG tablet Take 1 tablet (15 mg total) by mouth daily. Take with food each morning 15 tablet 0  . omeprazole (PRILOSEC) 20 MG capsule Take 1 capsule (20 mg total) by mouth daily. 30 capsule 1   No current facility-administered medications on file prior to visit.    No past surgical history on file.  Allergies  Allergen Reactions  . Retinoids Shortness Of Breath  . Sudafed [Pseudoephedrine Hcl] Palpitations    History   Social History  . Marital Status: Married    Spouse Name: N/A  . Number of Children: N/A  . Years of Education: N/A   Occupational History  . Not on file.   Social History Main Topics  . Smoking status: Never Smoker   . Smokeless tobacco: Never Used  . Alcohol Use: 0.0 oz/week    0 Standard drinks or equivalent per week  . Drug Use: No  . Sexual Activity:    Partners: Male    Birth Control/ Protection: Pill   Other Topics Concern  . Not on file   Social History Narrative    Family History  Problem Relation Age of Onset  . Diabetes Father   . Hypertension Father     BP 126/73 mmHg  Pulse 69  Ht 5\' 1"  (1.549 m)  Wt 133 lb (60.328 kg)  BMI 25.14 kg/m2  LMP 03/24/2015  Review of Systems: See HPI above.    Objective:   Physical Exam:  Gen: NAD  Right foot: Mild swelling, bruising 5th digit and distal lateral foot.  No malrotation, angulation of digits. TTP mildly 5th digit, less 5th metatarsal.  No other tenderness. FROM ankle.  Able to flex, extend digits. NVI distally.    Assessment & Plan:  1. Right 5th toe fracture - reassured.  Should improve over next 2-4 weeks.  Postop shoe for comfort, icing/nsaids as needed.  Shown how to buddy tape as well.  Activities as tolerated.  F/u in 4 weeks if having any problems.

## 2015-04-29 NOTE — Assessment & Plan Note (Signed)
reassured.  Should improve over next 2-4 weeks.  Postop shoe for comfort, icing/nsaids as needed.  Shown how to buddy tape as well.  Activities as tolerated.  F/u in 4 weeks if having any problems.

## 2015-04-30 ENCOUNTER — Encounter: Payer: 59 | Admitting: Sports Medicine

## 2015-06-19 ENCOUNTER — Telehealth: Payer: Self-pay | Admitting: Internal Medicine

## 2015-06-19 NOTE — Telephone Encounter (Signed)
I agree we need a work flow for this but the patient needs to be seen with a set of vitals before we take responsibility for the patients. Let's try to maybe squeeze her in early one day next week if she is willing, Monday, Friday might work at [employer]

## 2015-06-19 NOTE — Telephone Encounter (Signed)
Have discussed with scheduler and will forward to her to schedule this patient.

## 2015-06-19 NOTE — Telephone Encounter (Signed)
Is it ok to let the scheduler be aware you cannot fill medication until seen and ok for them to go ahead to schedule urgent/acute appt. With another provider here to at least cover them on medications until their appt. With new PCP. (This has become almost a daily request) ?

## 2015-06-19 NOTE — Telephone Encounter (Signed)
Pt has appt on Monday 

## 2015-06-19 NOTE — Telephone Encounter (Signed)
°  Relation to AF:BXUX Call back number:[phone] Pharmacy:[facility]  Reason for call: pt is a transfer pt from dr. Coralyn Mark and will est with dr. b on 11/03/15, however pt states her rx levonorgestrel-ethinyl estradiol (ENPRESSE,TRIVORA) tablet only has one more refill, requesting that dr. b send in her rx to the pharmacy.

## 2015-06-23 ENCOUNTER — Ambulatory Visit: Payer: 59 | Admitting: Family Medicine

## 2015-06-26 ENCOUNTER — Encounter: Payer: Self-pay | Admitting: Family Medicine

## 2015-06-26 ENCOUNTER — Ambulatory Visit (INDEPENDENT_AMBULATORY_CARE_PROVIDER_SITE_OTHER): Payer: 59 | Admitting: Family Medicine

## 2015-06-26 VITALS — BP 122/64 | HR 72 | Temp 98.3°F | Ht 60.0 in | Wt 134.1 lb

## 2015-06-26 DIAGNOSIS — S92501A Displaced unspecified fracture of right lesser toe(s), initial encounter for closed fracture: Secondary | ICD-10-CM | POA: Diagnosis not present

## 2015-06-26 DIAGNOSIS — E785 Hyperlipidemia, unspecified: Secondary | ICD-10-CM | POA: Diagnosis not present

## 2015-06-26 DIAGNOSIS — Z309 Encounter for contraceptive management, unspecified: Secondary | ICD-10-CM | POA: Diagnosis not present

## 2015-06-26 DIAGNOSIS — E559 Vitamin D deficiency, unspecified: Secondary | ICD-10-CM

## 2015-06-26 DIAGNOSIS — Z0000 Encounter for general adult medical examination without abnormal findings: Secondary | ICD-10-CM | POA: Diagnosis not present

## 2015-06-26 MED ORDER — DESOGESTREL-ETHINYL ESTRADIOL 0.15-0.02/0.01 MG (21/5) PO TABS: 1.0000 | ORAL_TABLET | Freq: Every day | ORAL | Status: DC

## 2015-06-26 NOTE — Progress Notes (Signed)
Pre visit review using our clinic review tool, if applicable. No additional management support is needed unless otherwise documented below in the visit note. 

## 2015-06-26 NOTE — Patient Instructions (Addendum)
Vitamin D 2000 IU daily  Salon Pas gel to toe twice dailyCholesterol Cholesterol is a white, waxy, fat-like substance needed by your body in small amounts. The liver makes all the cholesterol you need. Cholesterol is carried from the liver by the blood through the blood vessels. Deposits of cholesterol (plaque) may build up on blood vessel walls. These make the arteries narrower and stiffer. Cholesterol plaques increase the risk for heart attack and stroke.  You cannot feel your cholesterol level even if it is very high. The only way to know it is high is with a blood test. Once you know your cholesterol levels, you should keep a record of the test results. Work with your health care provider to keep your levels in the desired range.  WHAT DO THE RESULTS MEAN?  Total cholesterol is a rough measure of all the cholesterol in your blood.   LDL is the so-called bad cholesterol. This is the type that deposits cholesterol in the walls of the arteries. You want this level to be low.   HDL is the good cholesterol because it cleans the arteries and carries the LDL away. You want this level to be high.  Triglycerides are fat that the body can either burn for energy or store. High levels are closely linked to heart disease.  WHAT ARE THE DESIRED LEVELS OF CHOLESTEROL?  Total cholesterol below 200.   LDL below 100 for people at risk, below 70 for those at very high risk.   HDL above 50 is good, above 60 is best.   Triglycerides below 150.  HOW CAN I LOWER MY CHOLESTEROL?  Diet. Follow your diet programs as directed by your health care provider.   Choose fish or white meat chicken and [place], roasted or baked. Limit fatty cuts of red meat, fried foods, and processed meats, such as sausage and lunch meats.   Eat lots of fresh fruits and vegetables.  Choose whole grains, beans, pasta, potatoes, and cereals.   Use only small amounts of olive, corn, or canola oils.   Avoid butter,  mayonnaise, shortening, or palm kernel oils.  Avoid foods with trans fats.   Drink skim or nonfat milk and eat low-fat or nonfat yogurt and cheeses. Avoid whole milk, cream, ice cream, egg yolks, and full-fat cheeses.   Healthy desserts include angel food cake, ginger snaps, animal crackers, hard candy, popsicles, and low-fat or nonfat frozen yogurt. Avoid pastries, cakes, pies, and cookies.   Exercise. Follow your exercise programs as directed by your health care provider.   A regular program helps decrease LDL and raise HDL.   A regular program helps with weight control.   Do things that increase your activity level like gardening, walking, or taking the stairs. Ask your health care provider about how you can be more active in your daily life.   Medicine. Take medicine only as directed by your health care provider.   Medicine may be prescribed by your health care provider to help lower cholesterol and decrease the risk for heart disease.   If you have several risk factors, you may need medicine even if your levels are normal. Document Released: 07/06/2001 Document Revised: 02/25/2014 Document Reviewed: 07/25/2013 [facility] Patient Information 2015 [place], [place]. This information is not intended to replace advice given to you by your health care provider. Make sure you discuss any questions you have with your health care provider.

## 2015-07-13 ENCOUNTER — Encounter: Payer: Self-pay | Admitting: Family Medicine

## 2015-07-13 DIAGNOSIS — Z309 Encounter for contraceptive management, unspecified: Secondary | ICD-10-CM

## 2015-07-13 HISTORY — DX: Encounter for contraceptive management, unspecified: Z30.9

## 2015-07-13 NOTE — Assessment & Plan Note (Signed)
Patient requests switch to new OCP. Have ordered today

## 2015-07-13 NOTE — Progress Notes (Signed)
KEYSHA Thompson [id number] [birth year] 07/13/2015      Progress Note New Patient  Subjective  Chief Complaint  Chief Complaint  Patient presents with  . Establish Care    HPI  Patient is in today for new patient appointment. She is a 44 year old female who has a past medical history significant for vitamin D deficiency recent fracture of right fifth metatarsal, reflux and a childhood episode of hepatitis A. She continues to struggle with toe pain but offers no other acute complaints at this time. Would like to change her oral contraception pill. Denies CP/palp/SOB/HA/congestion/fevers/GI or GU c/o. Taking meds as prescribed  Past Medical History  Diagnosis Date  . Hyperlipidemia   . Gestational diabetes   . H/O gestational diabetes mellitus, not currently pregnant     No past surgical history on file.  Family History  Problem Relation Age of Onset  . Diabetes Father   . Hypertension Father   . Mental illness Mother     depression  . Hypertension Sister   . Gout Brother   . Cancer Maternal Aunt     lung    Social History   Social History  . Marital Status: Married    Spouse Name: N/A  . Number of Children: N/A  . Years of Education: N/A   Occupational History  . Not on file.   Social History Main Topics  . Smoking status: Never Smoker   . Smokeless tobacco: Never Used  . Alcohol Use: 0.0 oz/week    0 Standard drinks or equivalent per week     Comment: occasion  . Drug Use: No  . 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 Not on file   Social History Narrative    Current Outpatient Prescriptions on File Prior to Visit  Medication Sig Dispense Refill  . acetaminophen (TYLENOL) 500 MG tablet Take 500 mg by mouth every 6 (six) hours as needed.    . meloxicam (MOBIC) 15 MG tablet Take 1 tablet (15 mg total) by mouth  daily. Take with food each morning 15 tablet 0  . omeprazole (PRILOSEC) 20 MG capsule Take 1 capsule (20 mg total) by mouth daily. 30 capsule 1   No current facility-administered medications on file prior to visit.    Allergies  Allergen Reactions  . Retinoids Shortness Of Breath  . Sudafed [Pseudoephedrine Hcl] Palpitations    Review of Systems  Review of Systems  Constitutional: Negative for fever, chills and malaise/fatigue.  HENT: Negative for congestion and hearing loss.   Eyes: Negative for discharge.  Respiratory: Negative for cough, sputum production and shortness of breath.   Cardiovascular: Negative for chest pain, palpitations and leg swelling.  Gastrointestinal: Negative for heartburn, nausea, vomiting, abdominal pain, diarrhea, constipation and blood in stool.  Genitourinary: Negative for dysuria, urgency, frequency and hematuria.  Musculoskeletal: Positive for joint pain. Negative for myalgias, back pain and falls.  Skin: Negative for rash.  Neurological: Negative for dizziness, sensory change, loss of consciousness, weakness and headaches.  Endo/Heme/Allergies: Negative for environmental allergies. Does not bruise/bleed easily.  Psychiatric/Behavioral: Negative for depression and suicidal ideas. The patient is not nervous/anxious and does not have insomnia.     Objective  BP 122/64 mmHg  Pulse 72  Temp(Src) 98.3 F (36.8 C) (Oral)  Ht 5' (1.524 m)  Wt 134  lb 2 oz (60.839 kg)  BMI 26.19 kg/m2  SpO2 97%  LMP 06/26/2015  Physical Exam  Physical Exam  Constitutional: She is oriented to person, place, and time and well-developed, well-nourished, and in no distress. No distress.  HENT:  Head: Normocephalic and atraumatic.  Right Ear: External ear normal.  Left Ear: External ear normal.  Nose: Nose normal.  Mouth/Throat: Oropharynx is clear and moist. No oropharyngeal exudate.  Eyes: Conjunctivae are normal. Pupils are equal, round, and reactive to light.  Right eye exhibits no discharge. Left eye exhibits no discharge. No scleral icterus.  Neck: Normal range of motion. Neck supple. No thyromegaly present.  Cardiovascular: Normal rate, regular rhythm, normal heart sounds and intact distal pulses.   No murmur heard. Pulmonary/Chest: Effort normal and breath sounds normal. No respiratory distress. She has no wheezes. She has no rales.  Abdominal: Soft. Bowel sounds are normal. She exhibits no distension and no mass. There is no tenderness.  Musculoskeletal: Normal range of motion. She exhibits no edema or tenderness.  Lymphadenopathy:    She has no cervical adenopathy.  Neurological: She is alert and oriented to person, place, and time. She has normal reflexes. No cranial nerve deficit. Coordination normal.  Skin: Skin is warm and dry. No rash noted. She is not diaphoretic.  Psychiatric: Mood, memory and affect normal.       Assessment & Plan  Fracture of fifth toe, right, closed Xray confirms healing fracture, encouraged topical treatments for residual pain and may need referral if pain persists.  Hyperlipidemia Encouraged heart healthy diet, increase exercise, avoid trans fats, consider a krill oil cap daily  Contraceptive management Patient requests switch to new OCP. Have ordered today  Vitamin D deficiency Encouraged daily 2000 IU and monitor

## 2015-07-13 NOTE — Assessment & Plan Note (Signed)
Encouraged daily 2000 IU and monitor

## 2015-07-13 NOTE — Assessment & Plan Note (Signed)
Xray confirms healing fracture, encouraged topical treatments for residual pain and may need referral if pain persists.

## 2015-07-13 NOTE — Assessment & Plan Note (Signed)
Encouraged heart healthy diet, increase exercise, avoid trans fats, consider a krill oil cap daily 

## 2015-09-15 ENCOUNTER — Other Ambulatory Visit: Payer: Self-pay | Admitting: Family Medicine

## 2015-09-15 MED ORDER — SCOPOLAMINE 1 MG/3DAYS TD PT72: 1.0000 | MEDICATED_PATCH | TRANSDERMAL | Status: DC

## 2015-09-15 NOTE — Progress Notes (Signed)
Headed on cruise to [place], needs Scopolamine patches, sent to [place]

## 2015-09-24 ENCOUNTER — Telehealth: Payer: Self-pay | Admitting: Family Medicine

## 2015-09-24 NOTE — Telephone Encounter (Signed)
Called pt to schedule flu vac. 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

## 2015-09-24 NOTE — Telephone Encounter (Signed)
Health maintenance updated

## 2015-11-03 ENCOUNTER — Ambulatory Visit: Payer: 59 | Admitting: Family Medicine

## 2015-12-15 MED FILL — DESOGESTR-ETH ESTRAD ETH ES: 0.15-0.02/0 | 84 days supply | Qty: 84 | Fill #2

## 2016-01-02 ENCOUNTER — Telehealth: Payer: Self-pay | Admitting: Behavioral Health

## 2016-01-02 NOTE — Telephone Encounter (Signed)
Unable to reach patient at time of Pre-Visit Call.  Left message for patient to return call when available.    

## 2016-01-05 ENCOUNTER — Other Ambulatory Visit (HOSPITAL_COMMUNITY)
Admission: RE | Admit: 2016-01-05 | Discharge: 2016-01-05 | Disposition: A | Discharge status: Home / Self Care | Payer: 59 | Source: Ambulatory Visit | Attending: Family Medicine | Admitting: Family Medicine

## 2016-01-05 ENCOUNTER — Encounter: Payer: Self-pay | Admitting: Family Medicine

## 2016-01-05 ENCOUNTER — Ambulatory Visit (INDEPENDENT_AMBULATORY_CARE_PROVIDER_SITE_OTHER): Payer: 59 | Admitting: Family Medicine

## 2016-01-05 VITALS — BP 119/71 | HR 75 | Temp 98.8°F | Ht 60.0 in | Wt 132.5 lb

## 2016-01-05 DIAGNOSIS — Z01419 Encounter for gynecological examination (general) (routine) without abnormal findings: Secondary | ICD-10-CM | POA: Diagnosis not present

## 2016-01-05 DIAGNOSIS — Z124 Encounter for screening for malignant neoplasm of cervix: Secondary | ICD-10-CM

## 2016-01-05 DIAGNOSIS — Z309 Encounter for contraceptive management, unspecified: Secondary | ICD-10-CM | POA: Diagnosis not present

## 2016-01-05 DIAGNOSIS — E559 Vitamin D deficiency, unspecified: Secondary | ICD-10-CM

## 2016-01-05 DIAGNOSIS — Z0000 Encounter for general adult medical examination without abnormal findings: Secondary | ICD-10-CM

## 2016-01-05 DIAGNOSIS — E785 Hyperlipidemia, unspecified: Secondary | ICD-10-CM | POA: Diagnosis not present

## 2016-01-05 DIAGNOSIS — Z1239 Encounter for other screening for malignant neoplasm of breast: Secondary | ICD-10-CM

## 2016-01-05 HISTORY — DX: Encounter for general adult medical examination without abnormal findings: Z00.00

## 2016-01-05 NOTE — Assessment & Plan Note (Signed)
Encouraged heart healthy diet, increase exercise, avoid trans fats, consider a krill oil cap daily 

## 2016-01-05 NOTE — Progress Notes (Signed)
Pre visit review using our clinic review tool, if applicable. No additional management support is needed unless otherwise documented below in the visit note. 

## 2016-01-05 NOTE — Progress Notes (Signed)
Subjective:    Patient ID: Vickie Thompson, female    DOB: Feb 05, 1971, 45 y.o.   MRN: [id number]  Chief Complaint  Patient presents with  . Establish Care    HPI Patient is in today for New patient visit, establish care and for annual GYN exam. She feels well today. Denies any GYN complaints. Continues to work full time with kids at home. No recent illness or acute  Concerns. Denies CP/palp/SOB/HA/congestion/fevers/GI or GU c/o. Taking meds as prescribed  Past Medical History  Diagnosis Date  . H/O gestational diabetes mellitus, not currently pregnant   . Contraceptive management 07/13/2015  . Hyperlipidemia   . Contraceptive management 07/13/2015  . Contraceptive management 07/13/2015    Menarche at 17 Regular and moderate flow No history of abnormal pap in past G2P2, s/p 2 SVD No history of abnormal MGM No concerns today No gyn surgeries LMP 12/15/2015, normal, 5 days   . Preventative health care 01/05/2016    No past surgical history on file.  Family History  Problem Relation Age of Onset  . Diabetes Father   . Hypertension Father   . Mental illness Mother     depression  . Hypertension Sister   . Gout Brother   . Cancer Maternal Aunt     lung    Social History   Social History  . Marital Status: Married    Spouse Name: N/A  . Number of Children: N/A  . Years of Education: N/A   Occupational History  . Not on file.   Social History Main Topics  . Smoking status: Never Smoker   . Smokeless tobacco: Never Used  . Alcohol Use: 0.0 oz/week    0 Standard drinks or equivalent per week     Comment: occasion  . Drug Use: No  . 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 Not on file   Social History Narrative    Outpatient Prescriptions Prior to Visit  Medication Sig Dispense Refill  . acetaminophen (TYLENOL) 500 MG  tablet Take 500 mg by mouth every 6 (six) hours as needed.    . desogestrel-ethinyl estradiol (KARIVA) 0.15-0.02/0.01 MG (21/5) tablet Take 1 tablet by mouth daily. 3 Package 3  . omeprazole (PRILOSEC) 20 MG capsule Take 1 capsule (20 mg total) by mouth daily. 30 capsule 1  . meloxicam (MOBIC) 15 MG tablet Take 1 tablet (15 mg total) by mouth daily. Take with food each morning 15 tablet 0  . scopolamine (TRANSDERM-SCOP, 1.5 MG,) 1 MG/3DAYS Place 1 patch (1.5 mg total) onto the skin every 3 (three) days. 4 patch 0   No facility-administered medications prior to visit.    Allergies  Allergen Reactions  . Retinoids Shortness Of Breath  . Sudafed [Pseudoephedrine Hcl] Palpitations    Review of Systems  Constitutional: Negative for fever, chills and malaise/fatigue.  HENT: Negative for congestion and hearing loss.   Eyes: Negative for discharge.  Respiratory: Negative for cough, sputum production and shortness of breath.   Cardiovascular: Negative for chest pain, palpitations and leg swelling.  Gastrointestinal: Negative for heartburn, nausea, vomiting, abdominal pain, diarrhea, constipation and blood in stool.  Genitourinary: Negative for dysuria, urgency, frequency and hematuria.  Musculoskeletal: Negative for myalgias, back pain and falls.  Skin: Negative for rash.  Neurological: Negative for dizziness, sensory change, loss  of consciousness, weakness and headaches.  Endo/Heme/Allergies: Negative for environmental allergies. Does not bruise/bleed easily.  Psychiatric/Behavioral: Negative for depression and suicidal ideas. The patient is not nervous/anxious and does not have insomnia.        Objective:    Physical Exam  Constitutional: She is oriented to person, place, and time. She appears well-developed and well-nourished. No distress.  HENT:  Head: Normocephalic and atraumatic.  Eyes: Conjunctivae are normal.  Neck: Neck supple. No thyromegaly present.  Cardiovascular: Normal  rate, regular rhythm and normal heart sounds.   No murmur heard. Pulmonary/Chest: Effort normal and breath sounds normal. No respiratory distress.  Abdominal: Soft. Bowel sounds are normal. She exhibits no distension and no mass. There is no tenderness.  Musculoskeletal: She exhibits no edema.  Lymphadenopathy:    She has no cervical adenopathy.  Neurological: She is alert and oriented to person, place, and time.  Skin: Skin is warm and dry.  Psychiatric: She has a normal mood and affect. Her behavior is normal.    BP 119/71 mmHg  Pulse 75  Temp(Src) 98.8 F (37.1 C) (Oral)  Ht 5' (1.524 m)  Wt 132 lb 8 oz (60.102 kg)  BMI 25.88 kg/m2  SpO2 100% Wt Readings from Last 3 Encounters:  01/05/16 132 lb 8 oz (60.102 kg)  06/26/15 134 lb 2 oz (60.839 kg)  04/24/15 133 lb (60.328 kg)     Lab Results  Component Value Date   WBC 7.0 01/06/2015   HGB 13.5 01/06/2015   HCT 41.1 01/06/2015   PLT 345 01/06/2015   GLUCOSE 94 01/06/2015   CHOL 272* 01/06/2015   TRIG 132 01/06/2015   HDL 70 01/06/2015   LDLCALC 176* 01/06/2015   ALT 14 01/06/2015   AST 20 01/06/2015   NA 140 01/06/2015   K 3.7 01/06/2015   CL 102 01/06/2015   CREATININE 0.63 01/06/2015   BUN 11 01/06/2015   CO2 24 01/06/2015   TSH 1.853 01/06/2015    Lab Results  Component Value Date   TSH 1.853 01/06/2015   Lab Results  Component Value Date   WBC 7.0 01/06/2015   HGB 13.5 01/06/2015   HCT 41.1 01/06/2015   MCV 89.3 01/06/2015   PLT 345 01/06/2015   Lab Results  Component Value Date   NA 140 01/06/2015   K 3.7 01/06/2015   CO2 24 01/06/2015   GLUCOSE 94 01/06/2015   BUN 11 01/06/2015   CREATININE 0.63 01/06/2015   BILITOT 0.6 01/06/2015   ALKPHOS 66 01/06/2015   AST 20 01/06/2015   ALT 14 01/06/2015   PROT 7.3 01/06/2015   ALBUMIN 4.3 01/06/2015   CALCIUM 9.4 01/06/2015   Lab Results  Component Value Date   CHOL 272* 01/06/2015   Lab Results  Component Value Date   HDL 70 01/06/2015     Lab Results  Component Value Date   LDLCALC 176* 01/06/2015   Lab Results  Component Value Date   TRIG 132 01/06/2015   Lab Results  Component Value Date   CHOLHDL 3.9 01/06/2015   No results found for: HGBA1C     Assessment & Plan:   Problem List Items Addressed This Visit    Contraceptive management    Pap today, no concerns on exam.       Relevant Orders   Cytology - PAP   VITAMIN D 25 Hydroxy (Vit-D Deficiency, Fractures)   Lipid panel   MM Digital Diagnostic Bilat   Hyperlipidemia    Encouraged  heart healthy diet, increase exercise, avoid trans fats, consider a krill oil cap daily      Relevant Orders   Cytology - PAP   VITAMIN D 25 Hydroxy (Vit-D Deficiency, Fractures)   Lipid panel   MM Digital Diagnostic Bilat   Preventative health care    Patient encouraged to maintain heart healthy diet, regular exercise, adequate sleep. Consider daily probiotics. Take medications as prescribed. Pap today, labs next week for patient convenience and and MGM ordered today      Relevant Orders   Cytology - PAP   VITAMIN D 25 Hydroxy (Vit-D Deficiency, Fractures)   Lipid panel   MM Digital Diagnostic Bilat   Vitamin D deficiency    Will check level next week. Encouraged a daily supplement       Relevant Orders   Cytology - PAP   VITAMIN D 25 Hydroxy (Vit-D Deficiency, Fractures)   Lipid panel   MM Digital Diagnostic Bilat    Other Visit Diagnoses    Cervical cancer screening    -  Primary    Relevant Orders    Cytology - PAP    VITAMIN D 25 Hydroxy (Vit-D Deficiency, Fractures)    Lipid panel    MM Digital Diagnostic Bilat    Breast cancer screening        Relevant Orders    Cytology - PAP    VITAMIN D 25 Hydroxy (Vit-D Deficiency, Fractures)    Lipid panel    MM Digital Diagnostic Bilat       I have discontinued Ms. Maclachlan's meloxicam and scopolamine. I am also having her maintain her acetaminophen, omeprazole, and desogestrel-ethinyl estradiol.  No  orders of the defined types were placed in this encounter.     Penni Homans, MD

## 2016-01-05 NOTE — Assessment & Plan Note (Signed)
Pap today, no concerns on exam.  

## 2016-01-05 NOTE — Assessment & Plan Note (Signed)
Will check level next week. Encouraged a daily supplement

## 2016-01-05 NOTE — Assessment & Plan Note (Signed)
Patient encouraged to maintain heart healthy diet, regular exercise, adequate sleep. Consider daily probiotics. Take medications as prescribed. Pap today, labs next week for patient convenience and and MGM ordered today

## 2016-01-05 NOTE — Patient Instructions (Signed)
Preventive Care for Adults, Female A healthy lifestyle and preventive care can promote health and wellness. Preventive health guidelines for women include the following key practices.  A routine yearly physical is a good way to check with your health care provider about your health and preventive screening. It is a chance to share any concerns and updates on your health and to receive a thorough exam.  Visit your dentist for a routine exam and preventive care every 6 months. Brush your teeth twice a day and floss once a day. Good oral hygiene prevents tooth decay and gum disease.  The frequency of eye exams is based on your age, health, family medical history, use of contact lenses, and other factors. Follow your health care provider's recommendations for frequency of eye exams.  Eat a healthy diet. Foods like vegetables, fruits, whole grains, low-fat dairy products, and lean protein foods contain the nutrients you need without too many calories. Decrease your intake of foods high in solid fats, added sugars, and salt. Eat the right amount of calories for you.Get information about a proper diet from your health care provider, if necessary.  Regular physical exercise is one of the most important things you can do for your health. Most adults should get at least 150 minutes of moderate-intensity exercise (any activity that increases your heart rate and causes you to sweat) each week. In addition, most adults need muscle-strengthening exercises on 2 or more days a week.  Maintain a healthy weight. The body mass index (BMI) is a screening tool to identify possible weight problems. It provides an estimate of body fat based on height and weight. Your health care provider can find your BMI and can help you achieve or maintain a healthy weight.For adults 20 years and older:  A BMI below 18.5 is considered underweight.  A BMI of 18.5 to 24.9 is normal.  A BMI of 25 to 29.9 is considered overweight.  A  BMI of 30 and above is considered obese.  Maintain normal blood lipids and cholesterol levels by exercising and minimizing your intake of saturated fat. Eat a balanced diet with plenty of fruit and vegetables. Blood tests for lipids and cholesterol should begin at age 45 and be repeated every 5 years. If your lipid or cholesterol levels are high, you are over 50, or you are at high risk for heart disease, you may need your cholesterol levels checked more frequently.Ongoing high lipid and cholesterol levels should be treated with medicines if diet and exercise are not working.  If you smoke, find out from your health care provider how to quit. If you do not use tobacco, do not start.  Lung cancer screening is recommended for adults aged 45-80 years who are at high risk for developing lung cancer because of a history of smoking. A yearly low-dose CT scan of the lungs is recommended for people who have at least a 30-pack-year history of smoking and are a current smoker or have quit within the past 15 years. A pack year of smoking is smoking an average of 1 pack of cigarettes a day for 1 year (for example: 1 pack a day for 30 years or 2 packs a day for 15 years). Yearly screening should continue until the smoker has stopped smoking for at least 15 years. Yearly screening should be stopped for people who develop a health problem that would prevent them from having lung cancer treatment.  If you are pregnant, do not drink alcohol. If you are  breastfeeding, be very cautious about drinking alcohol. If you are not pregnant and choose to drink alcohol, do not have more than 1 drink per day. One drink is considered to be 12 ounces (355 mL) of beer, 5 ounces (148 mL) of wine, or 1.5 ounces (44 mL) of liquor.  Avoid use of street drugs. Do not share needles with anyone. Ask for help if you need support or instructions about stopping the use of drugs.  High blood pressure causes heart disease and increases the risk  of stroke. Your blood pressure should be checked at least every 1 to 2 years. Ongoing high blood pressure should be treated with medicines if weight loss and exercise do not work.  If you are 55-79 years old, ask your health care provider if you should take aspirin to prevent strokes.  Diabetes screening is done by taking a blood sample to check your blood glucose level after you have not eaten for a certain period of time (fasting). If you are not overweight and you do not have risk factors for diabetes, you should be screened once every 3 years starting at age 45. If you are overweight or obese and you are 40-70 years of age, you should be screened for diabetes every year as part of your cardiovascular risk assessment.  Breast cancer screening is essential preventive care for women. You should practice "breast self-awareness." This means understanding the normal appearance and feel of your breasts and may include breast self-examination. Any changes detected, no matter how small, should be reported to a health care provider. Women in their 20s and 30s should have a clinical breast exam (CBE) by a health care provider as part of a regular health exam every 1 to 3 years. After age 40, women should have a CBE every year. Starting at age 40, women should consider having a mammogram (breast X-ray test) every year. Women who have a family history of breast cancer should talk to their health care provider about genetic screening. Women at a high risk of breast cancer should talk to their health care providers about having an MRI and a mammogram every year.  Breast cancer gene (BRCA)-related cancer risk assessment is recommended for women who have family members with BRCA-related cancers. BRCA-related cancers include breast, ovarian, tubal, and peritoneal cancers. Having family members with these cancers may be associated with an increased risk for harmful changes (mutations) in the breast cancer genes BRCA1 and  BRCA2. Results of the assessment will determine the need for genetic counseling and BRCA1 and BRCA2 testing.  Your health care provider may recommend that you be screened regularly for cancer of the pelvic organs (ovaries, uterus, and vagina). This screening involves a pelvic examination, including checking for microscopic changes to the surface of your cervix (Pap test). You may be encouraged to have this screening done every 3 years, beginning at age 21.  For women ages 30-65, health care providers may recommend pelvic exams and Pap testing every 3 years, or they may recommend the Pap and pelvic exam, combined with testing for human papilloma virus (HPV), every 5 years. Some types of HPV increase your risk of cervical cancer. Testing for HPV may also be done on women of any age with unclear Pap test results.  Other health care providers may not recommend any screening for nonpregnant women who are considered low risk for pelvic cancer and who do not have symptoms. Ask your health care provider if a screening pelvic exam is right for   you.  If you have had past treatment for cervical cancer or a condition that could lead to cancer, you need Pap tests and screening for cancer for at least 20 years after your treatment. If Pap tests have been discontinued, your risk factors (such as having a new sexual partner) need to be reassessed to determine if screening should resume. Some women have medical problems that increase the chance of getting cervical cancer. In these cases, your health care provider may recommend more frequent screening and Pap tests.  Colorectal cancer can be detected and often prevented. Most routine colorectal cancer screening begins at the age of 50 years and continues through age 75 years. However, your health care provider may recommend screening at an earlier age if you have risk factors for colon cancer. On a yearly basis, your health care provider may provide home test kits to check  for hidden blood in the stool. Use of a small camera at the end of a tube, to directly examine the colon (sigmoidoscopy or colonoscopy), can detect the earliest forms of colorectal cancer. Talk to your health care provider about this at age 50, when routine screening begins. Direct exam of the colon should be repeated every 5-10 years through age 75 years, unless early forms of precancerous polyps or small growths are found.  People who are at an increased risk for hepatitis B should be screened for this virus. You are considered at high risk for hepatitis B if:  You were born in a country where hepatitis B occurs often. Talk with your health care provider about which countries are considered high risk.  Your parents were born in a high-risk country and you have not received a shot to protect against hepatitis B (hepatitis B vaccine).  You have HIV or AIDS.  You use needles to inject street drugs.  You live with, or have sex with, someone who has hepatitis B.  You get hemodialysis treatment.  You take certain medicines for conditions like cancer, organ transplantation, and autoimmune conditions.  Hepatitis C blood testing is recommended for all people born from 1945 through 1965 and any individual with known risks for hepatitis C.  Practice safe sex. Use condoms and avoid high-risk sexual practices to reduce the spread of sexually transmitted infections (STIs). STIs include gonorrhea, chlamydia, syphilis, trichomonas, herpes, HPV, and human immunodeficiency virus (HIV). Herpes, HIV, and HPV are viral illnesses that have no cure. They can result in disability, cancer, and death.  You should be screened for sexually transmitted illnesses (STIs) including gonorrhea and chlamydia if:  You are sexually active and are younger than 24 years.  You are older than 24 years and your health care provider tells you that you are at risk for this type of infection.  Your sexual activity has changed  since you were last screened and you are at an increased risk for chlamydia or gonorrhea. Ask your health care provider if you are at risk.  If you are at risk of being infected with HIV, it is recommended that you take a prescription medicine daily to prevent HIV infection. This is called preexposure prophylaxis (PrEP). You are considered at risk if:  You are sexually active and do not regularly use condoms or know the HIV status of your partner(s).  You take drugs by injection.  You are sexually active with a partner who has HIV.  Talk with your health care provider about whether you are at high risk of being infected with HIV. If   you choose to begin PrEP, you should first be tested for HIV. You should then be tested every 3 months for as long as you are taking PrEP.  Osteoporosis is a disease in which the bones lose minerals and strength with aging. This can result in serious bone fractures or breaks. The risk of osteoporosis can be identified using a bone density scan. Women ages 67 years and over and women at risk for fractures or osteoporosis should discuss screening with their health care providers. Ask your health care provider whether you should take a calcium supplement or vitamin D to reduce the rate of osteoporosis.  Menopause can be associated with physical symptoms and risks. Hormone replacement therapy is available to decrease symptoms and risks. You should talk to your health care provider about whether hormone replacement therapy is right for you.  Use sunscreen. Apply sunscreen liberally and repeatedly throughout the day. You should seek shade when your shadow is shorter than you. Protect yourself by wearing long sleeves, pants, a wide-brimmed hat, and sunglasses year round, whenever you are outdoors.  Once a month, do a whole body skin exam, using a mirror to look at the skin on your back. Tell your health care provider of new moles, moles that have irregular borders, moles that  are larger than a pencil eraser, or moles that have changed in shape or color.  Stay current with required vaccines (immunizations).  Influenza vaccine. All adults should be immunized every year.  Tetanus, diphtheria, and acellular pertussis (Td, Tdap) vaccine. Pregnant women should receive 1 dose of Tdap vaccine during each pregnancy. The dose should be obtained regardless of the length of time since the last dose. Immunization is preferred during the 27th-36th week of gestation. An adult who has not previously received Tdap or who does not know her vaccine status should receive 1 dose of Tdap. This initial dose should be followed by tetanus and diphtheria toxoids (Td) booster doses every 10 years. Adults with an unknown or incomplete history of completing a 3-dose immunization series with Td-containing vaccines should begin or complete a primary immunization series including a Tdap dose. Adults should receive a Td booster every 10 years.  Varicella vaccine. An adult without evidence of immunity to varicella should receive 2 doses or a second dose if she has previously received 1 dose. Pregnant females who do not have evidence of immunity should receive the first dose after pregnancy. This first dose should be obtained before leaving the health care facility. The second dose should be obtained 4-8 weeks after the first dose.  Human papillomavirus (HPV) vaccine. Females aged 13-26 years who have not received the vaccine previously should obtain the 3-dose series. The vaccine is not recommended for use in pregnant females. However, pregnancy testing is not needed before receiving a dose. If a female is found to be pregnant after receiving a dose, no treatment is needed. In that case, the remaining doses should be delayed until after the pregnancy. Immunization is recommended for any person with an immunocompromised condition through the age of 61 years if she did not get any or all doses earlier. During the  3-dose series, the second dose should be obtained 4-8 weeks after the first dose. The third dose should be obtained 24 weeks after the first dose and 16 weeks after the second dose.  Zoster vaccine. One dose is recommended for adults aged 30 years or older unless certain conditions are present.  Measles, mumps, and rubella (MMR) vaccine. Adults born  before 1957 generally are considered immune to measles and mumps. Adults born in 1957 or later should have 1 or more doses of MMR vaccine unless there is a contraindication to the vaccine or there is laboratory evidence of immunity to each of the three diseases. A routine second dose of MMR vaccine should be obtained at least 28 days after the first dose for students attending postsecondary schools, health care workers, or international travelers. People who received inactivated measles vaccine or an unknown type of measles vaccine during 1963-1967 should receive 2 doses of MMR vaccine. People who received inactivated mumps vaccine or an unknown type of mumps vaccine before 1979 and are at high risk for mumps infection should consider immunization with 2 doses of MMR vaccine. For females of childbearing age, rubella immunity should be determined. If there is no evidence of immunity, females who are not pregnant should be vaccinated. If there is no evidence of immunity, females who are pregnant should delay immunization until after pregnancy. Unvaccinated health care workers born before 1957 who lack laboratory evidence of measles, mumps, or rubella immunity or laboratory confirmation of disease should consider measles and mumps immunization with 2 doses of MMR vaccine or rubella immunization with 1 dose of MMR vaccine.  Pneumococcal 13-valent conjugate (PCV13) vaccine. When indicated, a person who is uncertain of his immunization history and has no record of immunization should receive the PCV13 vaccine. All adults 65 years of age and older should receive this  vaccine. An adult aged 19 years or older who has certain medical conditions and has not been previously immunized should receive 1 dose of PCV13 vaccine. This PCV13 should be followed with a dose of pneumococcal polysaccharide (PPSV23) vaccine. Adults who are at high risk for pneumococcal disease should obtain the PPSV23 vaccine at least 8 weeks after the dose of PCV13 vaccine. Adults older than 44 years of age who have normal immune system function should obtain the PPSV23 vaccine dose at least 1 year after the dose of PCV13 vaccine.  Pneumococcal polysaccharide (PPSV23) vaccine. When PCV13 is also indicated, PCV13 should be obtained first. All adults aged 65 years and older should be immunized. An adult younger than age 65 years who has certain medical conditions should be immunized. Any person who resides in a nursing home or long-term care facility should be immunized. An adult smoker should be immunized. People with an immunocompromised condition and certain other conditions should receive both PCV13 and PPSV23 vaccines. People with human immunodeficiency virus (HIV) infection should be immunized as soon as possible after diagnosis. Immunization during chemotherapy or radiation therapy should be avoided. Routine use of PPSV23 vaccine is not recommended for American Indians, [place] Natives, or people younger than 65 years unless there are medical conditions that require PPSV23 vaccine. When indicated, people who have unknown immunization and have no record of immunization should receive PPSV23 vaccine. One-time revaccination 5 years after the first dose of PPSV23 is recommended for people aged 19-64 years who have chronic kidney failure, nephrotic syndrome, asplenia, or immunocompromised conditions. People who received 1-2 doses of PPSV23 before age 65 years should receive another dose of PPSV23 vaccine at age 65 years or later if at least 5 years have passed since the previous dose. Doses of PPSV23 are not  needed for people immunized with PPSV23 at or after age 65 years.  Meningococcal vaccine. Adults with asplenia or persistent complement component deficiencies should receive 2 doses of quadrivalent meningococcal conjugate (MenACWY-D) vaccine. The doses should be obtained   at least 2 months apart. Microbiologists working with certain meningococcal bacteria, [place] recruits, people at risk during an outbreak, and people who travel to or live in countries with a high rate of meningitis should be immunized. A first-year college student up through age 34 years who is living in a residence hall should receive a dose if she did not receive a dose on or after her 16th birthday. Adults who have certain high-risk conditions should receive one or more doses of vaccine.  Hepatitis A vaccine. Adults who wish to be protected from this disease, have certain high-risk conditions, work with hepatitis A-infected animals, work in hepatitis A research labs, or travel to or work in countries with a high rate of hepatitis A should be immunized. Adults who were previously unvaccinated and who anticipate close contact with an international adoptee during the first 60 days after arrival in the [place] from a country with a high rate of hepatitis A should be immunized.  Hepatitis B vaccine. Adults who wish to be protected from this disease, have certain high-risk conditions, may be exposed to blood or other infectious body fluids, are household contacts or sex partners of hepatitis B positive people, are clients or workers in certain care facilities, or travel to or work in countries with a high rate of hepatitis B should be immunized.  Haemophilus influenzae type b (Hib) vaccine. A previously unvaccinated person with asplenia or sickle cell disease or having a scheduled splenectomy should receive 1 dose of Hib vaccine. Regardless of previous immunization, a recipient of a hematopoietic stem cell transplant should receive a  3-dose series 6-12 months after her successful transplant. Hib vaccine is not recommended for adults with HIV infection. Preventive Services / Frequency Ages 35 to 4 years  Blood pressure check.** / Every 3-5 years.  Lipid and cholesterol check.** / Every 5 years beginning at age 60.  Clinical breast exam.** / Every 3 years for women in their 71s and 10s.  BRCA-related cancer risk assessment.** / For women who have family members with a BRCA-related cancer (breast, ovarian, tubal, or peritoneal cancers).  Pap test.** / Every 2 years from ages 76 through 26. Every 3 years starting at age 61 through age 76 or 93 with a history of 3 consecutive normal Pap tests.  HPV screening.** / Every 3 years from ages 37 through ages 60 to 51 with a history of 3 consecutive normal Pap tests.  Hepatitis C blood test.** / For any individual with known risks for hepatitis C.  Skin self-exam. / Monthly.  Influenza vaccine. / Every year.  Tetanus, diphtheria, and acellular pertussis (Tdap, Td) vaccine.** / Consult your health care provider. Pregnant women should receive 1 dose of Tdap vaccine during each pregnancy. 1 dose of Td every 10 years.  Varicella vaccine.** / Consult your health care provider. Pregnant females who do not have evidence of immunity should receive the first dose after pregnancy.  HPV vaccine. / 3 doses over 6 months, if 93 and younger. The vaccine is not recommended for use in pregnant females. However, pregnancy testing is not needed before receiving a dose.  Measles, mumps, rubella (MMR) vaccine.** / You need at least 1 dose of MMR if you were born in 1957 or later. You may also need a 2nd dose. For females of childbearing age, rubella immunity should be determined. If there is no evidence of immunity, females who are not pregnant should be vaccinated. If there is no evidence of immunity, females who are  pregnant should delay immunization until after pregnancy.  Pneumococcal  13-valent conjugate (PCV13) vaccine.** / Consult your health care provider.  Pneumococcal polysaccharide (PPSV23) vaccine.** / 1 to 2 doses if you smoke cigarettes or if you have certain conditions.  Meningococcal vaccine.** / 1 dose if you are age 68 to 8 years and a Market researcher living in a residence hall, or have one of several medical conditions, you need to get vaccinated against meningococcal disease. You may also need additional booster doses.  Hepatitis A vaccine.** / Consult your health care provider.  Hepatitis B vaccine.** / Consult your health care provider.  Haemophilus influenzae type b (Hib) vaccine.** / Consult your health care provider. Ages 7 to 53 years  Blood pressure check.** / Every year.  Lipid and cholesterol check.** / Every 5 years beginning at age 25 years.  Lung cancer screening. / Every year if you are aged 11-80 years and have a 30-pack-year history of smoking and currently smoke or have quit within the past 15 years. Yearly screening is stopped once you have quit smoking for at least 15 years or develop a health problem that would prevent you from having lung cancer treatment.  Clinical breast exam.** / Every year after age 48 years.  BRCA-related cancer risk assessment.** / For women who have family members with a BRCA-related cancer (breast, ovarian, tubal, or peritoneal cancers).  Mammogram.** / Every year beginning at age 41 years and continuing for as long as you are in good health. Consult with your health care provider.  Pap test.** / Every 3 years starting at age 65 years through age 37 or 70 years with a history of 3 consecutive normal Pap tests.  HPV screening.** / Every 3 years from ages 72 years through ages 60 to 40 years with a history of 3 consecutive normal Pap tests.  Fecal occult blood test (FOBT) of stool. / Every year beginning at age 21 years and continuing until age 5 years. You may not need to do this test if you get  a colonoscopy every 10 years.  Flexible sigmoidoscopy or colonoscopy.** / Every 5 years for a flexible sigmoidoscopy or every 10 years for a colonoscopy beginning at age 35 years and continuing until age 48 years.  Hepatitis C blood test.** / For all people born from [birth year] through 1965 and any individual with known risks for hepatitis C.  Skin self-exam. / Monthly.  Influenza vaccine. / Every year.  Tetanus, diphtheria, and acellular pertussis (Tdap/Td) vaccine.** / Consult your health care provider. Pregnant women should receive 1 dose of Tdap vaccine during each pregnancy. 1 dose of Td every 10 years.  Varicella vaccine.** / Consult your health care provider. Pregnant females who do not have evidence of immunity should receive the first dose after pregnancy.  Zoster vaccine.** / 1 dose for adults aged 30 years or older.  Measles, mumps, rubella (MMR) vaccine.** / You need at least 1 dose of MMR if you were born in 1957 or later. You may also need a second dose. For females of childbearing age, rubella immunity should be determined. If there is no evidence of immunity, females who are not pregnant should be vaccinated. If there is no evidence of immunity, females who are pregnant should delay immunization until after pregnancy.  Pneumococcal 13-valent conjugate (PCV13) vaccine.** / Consult your health care provider.  Pneumococcal polysaccharide (PPSV23) vaccine.** / 1 to 2 doses if you smoke cigarettes or if you have certain conditions.  Meningococcal vaccine.** /  Consult your health care provider.  Hepatitis A vaccine.** / Consult your health care provider.  Hepatitis B vaccine.** / Consult your health care provider.  Haemophilus influenzae type b (Hib) vaccine.** / Consult your health care provider. Ages 64 years and over  Blood pressure check.** / Every year.  Lipid and cholesterol check.** / Every 5 years beginning at age 23 years.  Lung cancer screening. / Every year if you  are aged 16-80 years and have a 30-pack-year history of smoking and currently smoke or have quit within the past 15 years. Yearly screening is stopped once you have quit smoking for at least 15 years or develop a health problem that would prevent you from having lung cancer treatment.  Clinical breast exam.** / Every year after age 74 years.  BRCA-related cancer risk assessment.** / For women who have family members with a BRCA-related cancer (breast, ovarian, tubal, or peritoneal cancers).  Mammogram.** / Every year beginning at age 44 years and continuing for as long as you are in good health. Consult with your health care provider.  Pap test.** / Every 3 years starting at age 58 years through age 22 or 39 years with 3 consecutive normal Pap tests. Testing can be stopped between 65 and 70 years with 3 consecutive normal Pap tests and no abnormal Pap or HPV tests in the past 10 years.  HPV screening.** / Every 3 years from ages 64 years through ages 70 or 61 years with a history of 3 consecutive normal Pap tests. Testing can be stopped between 65 and 70 years with 3 consecutive normal Pap tests and no abnormal Pap or HPV tests in the past 10 years.  Fecal occult blood test (FOBT) of stool. / Every year beginning at age 40 years and continuing until age 27 years. You may not need to do this test if you get a colonoscopy every 10 years.  Flexible sigmoidoscopy or colonoscopy.** / Every 5 years for a flexible sigmoidoscopy or every 10 years for a colonoscopy beginning at age 7 years and continuing until age 32 years.  Hepatitis C blood test.** / For all people born from [birth year] through 1965 and any individual with known risks for hepatitis C.  Osteoporosis screening.** / A one-time screening for women ages 30 years and over and women at risk for fractures or osteoporosis.  Skin self-exam. / Monthly.  Influenza vaccine. / Every year.  Tetanus, diphtheria, and acellular pertussis (Tdap/Td)  vaccine.** / 1 dose of Td every 10 years.  Varicella vaccine.** / Consult your health care provider.  Zoster vaccine.** / 1 dose for adults aged 35 years or older.  Pneumococcal 13-valent conjugate (PCV13) vaccine.** / Consult your health care provider.  Pneumococcal polysaccharide (PPSV23) vaccine.** / 1 dose for all adults aged 46 years and older.  Meningococcal vaccine.** / Consult your health care provider.  Hepatitis A vaccine.** / Consult your health care provider.  Hepatitis B vaccine.** / Consult your health care provider.  Haemophilus influenzae type b (Hib) vaccine.** / Consult your health care provider. ** Family history and personal history of risk and conditions may change your health care provider's recommendations.   This information is not intended to replace advice given to you by your health care provider. Make sure you discuss any questions you have with your health care provider.   Document Released: 12/07/2001 Document Revised: 11/01/2014 Document Reviewed: 03/08/2011 Elsevier Interactive Patient Education [employer].

## 2016-01-07 LAB — CYTOLOGY - PAP

## 2016-01-12 ENCOUNTER — Ambulatory Visit (HOSPITAL_BASED_OUTPATIENT_CLINIC_OR_DEPARTMENT_OTHER)
Admission: RE | Admit: 2016-01-12 | Discharge: 2016-01-12 | Disposition: A | Discharge status: Home / Self Care | Payer: 59 | Source: Ambulatory Visit | Attending: Family Medicine | Admitting: Family Medicine

## 2016-01-12 DIAGNOSIS — Z1239 Encounter for other screening for malignant neoplasm of breast: Secondary | ICD-10-CM | POA: Insufficient documentation

## 2016-01-12 DIAGNOSIS — Z124 Encounter for screening for malignant neoplasm of cervix: Secondary | ICD-10-CM | POA: Insufficient documentation

## 2016-01-12 DIAGNOSIS — Z0000 Encounter for general adult medical examination without abnormal findings: Secondary | ICD-10-CM | POA: Insufficient documentation

## 2016-01-12 DIAGNOSIS — E559 Vitamin D deficiency, unspecified: Secondary | ICD-10-CM | POA: Diagnosis not present

## 2016-01-12 DIAGNOSIS — Z1231 Encounter for screening mammogram for malignant neoplasm of breast: Secondary | ICD-10-CM | POA: Insufficient documentation

## 2016-01-12 DIAGNOSIS — Z309 Encounter for contraceptive management, unspecified: Secondary | ICD-10-CM | POA: Insufficient documentation

## 2016-01-12 DIAGNOSIS — E785 Hyperlipidemia, unspecified: Secondary | ICD-10-CM | POA: Diagnosis not present

## 2016-02-23 DIAGNOSIS — Z79899 Other long term (current) drug therapy: Secondary | ICD-10-CM | POA: Diagnosis not present

## 2016-02-23 DIAGNOSIS — Z131 Encounter for screening for diabetes mellitus: Secondary | ICD-10-CM | POA: Diagnosis not present

## 2016-02-23 DIAGNOSIS — R635 Abnormal weight gain: Secondary | ICD-10-CM | POA: Diagnosis not present

## 2016-02-23 DIAGNOSIS — E559 Vitamin D deficiency, unspecified: Secondary | ICD-10-CM | POA: Diagnosis not present

## 2016-02-23 DIAGNOSIS — R0602 Shortness of breath: Secondary | ICD-10-CM | POA: Diagnosis not present

## 2016-02-23 DIAGNOSIS — R5383 Other fatigue: Secondary | ICD-10-CM | POA: Diagnosis not present

## 2016-03-02 MED FILL — DESOGESTR-ETH ESTRAD ETH ES: 0.15-0.02/0 | 84 days supply | Qty: 84 | Fill #3

## 2016-03-03 DIAGNOSIS — E782 Mixed hyperlipidemia: Secondary | ICD-10-CM | POA: Diagnosis not present

## 2016-03-03 DIAGNOSIS — E559 Vitamin D deficiency, unspecified: Secondary | ICD-10-CM | POA: Diagnosis not present

## 2016-03-03 DIAGNOSIS — R635 Abnormal weight gain: Secondary | ICD-10-CM | POA: Diagnosis not present

## 2016-03-03 MED FILL — PRAVASTATIN NA 40 MG TAB: 40 | 30 days supply | Qty: 30 | Fill #0

## 2016-03-03 MED FILL — PHENTERMINE 37.5 MG TABLET: 37.5 | 30 days supply | Qty: 30 | Fill #0

## 2016-04-05 MED FILL — PRAVASTATIN NA 40 MG TAB: 40 | 30 days supply | Qty: 30 | Fill #1

## 2016-05-03 DIAGNOSIS — R635 Abnormal weight gain: Secondary | ICD-10-CM | POA: Diagnosis not present

## 2016-05-03 DIAGNOSIS — E782 Mixed hyperlipidemia: Secondary | ICD-10-CM | POA: Diagnosis not present

## 2016-05-03 DIAGNOSIS — Z79899 Other long term (current) drug therapy: Secondary | ICD-10-CM | POA: Diagnosis not present

## 2016-05-03 MED FILL — ROSUVASTATIN CALCIUM 20 MG: 20 | 30 days supply | Qty: 30 | Fill #0

## 2016-05-04 ENCOUNTER — Other Ambulatory Visit: Payer: Self-pay | Admitting: Family Medicine

## 2016-05-04 MED FILL — DESOGESTR-ETH ESTRAD ETH ES: 0.15-0.02/0 | 84 days supply | Qty: 84 | Fill #0

## 2016-05-04 MED FILL — PHENTERMINE 37.5 MG TABLET: 37.5 | 30 days supply | Qty: 30 | Fill #0

## 2016-05-19 MED FILL — QSYMIA 3.75 MG-23 MG CAP: 3.75-23 | 14 days supply | Qty: 14 | Fill #0

## 2016-06-23 MED FILL — ROSUVASTATIN CALCIUM 20 MG: 20 | 30 days supply | Qty: 30 | Fill #1

## 2016-08-16 MED FILL — VIORELE 28 DAY TABLET: 0.15-0.02/0 | 84 days supply | Qty: 84 | Fill #1

## 2016-08-31 MED FILL — MELOXICAM 7.5 MG TABLET: 7.5 | 6 days supply | Qty: 12 | Fill #0

## 2016-09-20 DIAGNOSIS — H524 Presbyopia: Secondary | ICD-10-CM | POA: Diagnosis not present

## 2016-09-28 MED FILL — AMOXICILLIN 500 MG CAPSULE: 500 | 7 days supply | Qty: 21 | Fill #0

## 2016-10-07 ENCOUNTER — Ambulatory Visit: Payer: 59 | Admitting: Medical

## 2016-10-13 ENCOUNTER — Encounter: Payer: Self-pay | Admitting: Medical

## 2016-10-13 ENCOUNTER — Ambulatory Visit (HOSPITAL_BASED_OUTPATIENT_CLINIC_OR_DEPARTMENT_OTHER)
Admission: RE | Admit: 2016-10-13 | Discharge: 2016-10-13 | Disposition: A | Discharge status: Home / Self Care | Payer: 59 | Source: Ambulatory Visit | Attending: Medical | Admitting: Medical

## 2016-10-13 ENCOUNTER — Ambulatory Visit (INDEPENDENT_AMBULATORY_CARE_PROVIDER_SITE_OTHER): Payer: 59 | Admitting: Medical

## 2016-10-13 VITALS — BP 137/85 | HR 81 | Temp 98.3°F | Resp 16 | Ht 60.0 in | Wt 136.4 lb

## 2016-10-13 DIAGNOSIS — M7712 Lateral epicondylitis, left elbow: Secondary | ICD-10-CM | POA: Insufficient documentation

## 2016-10-13 DIAGNOSIS — K219 Gastro-esophageal reflux disease without esophagitis: Secondary | ICD-10-CM | POA: Diagnosis not present

## 2016-10-13 DIAGNOSIS — M25522 Pain in left elbow: Secondary | ICD-10-CM | POA: Diagnosis not present

## 2016-10-13 MED ORDER — DICLOFENAC SODIUM 75 MG PO TBEC: 75.0000 mg | DELAYED_RELEASE_TABLET | Freq: Two times a day (BID) | ORAL | 0 refills | Status: DC

## 2016-10-13 MED ORDER — PANTOPRAZOLE SODIUM 40 MG PO TBEC: 40.0000 mg | DELAYED_RELEASE_TABLET | Freq: Every day | ORAL | 1 refills | Status: DC

## 2016-10-13 MED FILL — PANTOPRAZOLE SOD DR 40 MG T: 40 | 90 days supply | Qty: 90 | Fill #0

## 2016-10-13 MED FILL — DICLOFENAC SOD 75 MG TAB EC: 75 | 10 days supply | Qty: 20 | Fill #0

## 2016-10-13 NOTE — Progress Notes (Signed)
Subjective:    Patient ID: Vickie Thompson, female    DOB: 12-13-1970, 45 y.o.   MRN: [id number]  HPI   Pt in for some left proximal forearm pain for one month. She notes when wiggles fingers, pronates/supinatea and use arm will have pain. No trauma or fall. Pt is right handed. No pain in elbow itself. No pain in bicep or shoulder. No neck pain. Pt is rt handed.  Pt has hx of reflux /heart burn. Pt states on prilosec and does not control her reflux completley. She wants to try protonix. She states should be covered under her insurance.  LMP-09-13-2016. On ocp.     Review of Systems  Constitutional: Negative for chills, fatigue and fever.  HENT: Negative for congestion and ear pain.   Respiratory: Negative for cough, chest tightness, shortness of breath and wheezing.   Cardiovascular: Negative for chest pain and palpitations.  Gastrointestinal: Negative for abdominal pain, blood in stool and constipation.       Hx of reflux.  Musculoskeletal:       Left forearm and elbow pain.  Neurological: Negative for dizziness, weakness and headaches.  Hematological: Negative for adenopathy. Does not bruise/bleed easily.  Psychiatric/Behavioral: Negative for behavioral problems and confusion.   Past Medical History:  Diagnosis Date  . Contraceptive management 07/13/2015  . Contraceptive management 07/13/2015  . Contraceptive management 07/13/2015   Menarche at 17 Regular and moderate flow No history of abnormal pap in past G2P2, s/p 2 SVD No history of abnormal MGM No concerns today No gyn surgeries LMP 12/15/2015, normal, 5 days   . H/O gestational diabetes mellitus, not currently pregnant   . Hyperlipidemia   . Preventative health care 01/05/2016     Social History   Social History  . Marital status: Married    Spouse name: N/A  . Number of children: N/A  . Years of education: N/A   Occupational History  . Not on file.   Social History Main Topics  . Smoking status: Never Smoker    . Smokeless tobacco: Never Used  . Alcohol use 0.0 oz/week     Comment: occasion  . Drug use: No  . 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 Not on file   Social History Narrative  . No narrative on file    No past surgical history on file.  Family History  Problem Relation Age of Onset  . Diabetes Father   . Hypertension Father   . Mental illness Mother     depression  . Hypertension Sister   . Gout Brother   . Cancer Maternal Aunt     lung    Allergies  Allergen Reactions  . Retinoids Shortness Of Breath  . Sudafed [Pseudoephedrine Hcl] Palpitations    Current Outpatient Prescriptions on File Prior to Visit  Medication Sig Dispense Refill  . acetaminophen (TYLENOL) 500 MG tablet Take 500 mg by mouth every 6 (six) hours as needed.    . desogestrel-ethinyl estradiol (KARIVA,AZURETTE,MIRCETTE) 0.15-0.02/0.01 MG (21/5) tablet TAKE 1 TABLET BY MOUTH DAILY. 84 tablet 3  . omeprazole (PRILOSEC) 20 MG capsule Take 1 capsule (20 mg total) by mouth daily. (Patient not taking: Reported on 10/13/2016) 30 capsule 1   No current facility-administered medications on file prior to visit.     BP  137/85 (BP Location: Right Arm, Patient Position: Sitting, Cuff Size: Normal)   Pulse 81   Temp 98.3 F (36.8 C) (Oral)   Resp 16   Ht 5' (1.524 m)   Wt 136 lb 6 oz (61.9 kg)   LMP 09/08/2016   SpO2 100%   BMI 26.63 kg/m       Objective:   Physical Exam   General- No acute distress. Pleasant patient. Neck- Full range of motion, no jvd Lungs- Clear, even and unlabored. Heart- regular rate and rhythm. Neurologic- CNII- XII grossly intact.  Left upper ext- lateral epicondyle tender to palpation. On pronation and supination. No pain today but reports some pain occasionally. On moving digits some proximal forearm muscle pain.    Lt shoulder and left bicep- no tenderness to palpation.       Assessment & Plan:  You do appear to have lateral epicondylitis by history and exam.   Recommend get xray today. Can treat with ice, tennis elbow brace and diclofenac rx.  Will  notify you on xray result  and see how you respond to treatment. If pain still lingering then can refer you to sports medicine or orthopedist whichever you prefer.  Will rx protonix in place of prilosec at your request. Healthy diet recommended. If any worse or changing abdomen symptoms please notify us.  Follow up in 10-14 days or as needed  Fern Canova, Percell Miller, [employer]

## 2016-10-13 NOTE — Patient Instructions (Addendum)
You do appear to have lateral epicondylitis by history and exam.   Recommend get xray today. Can treat with ice, tennis elbow brace and diclofenac rx.  Will  notify you on xray result  and see how you respond to treatment. If pain still lingering then can refer you to sports medicine or orthopedist whichever you prefer.  Will rx protonix in place of prilosec at your request. Healthy diet recommended. If any worse or changing abdomen symptoms please notify us.  Follow up in 10-14 days or as needed

## 2016-10-13 NOTE — Progress Notes (Signed)
Pre visit review using our clinic review tool, if applicable. No additional management support is needed unless otherwise documented below in the visit note/SLS  

## 2016-10-15 ENCOUNTER — Telehealth: Payer: Self-pay | Admitting: Family Medicine

## 2016-10-15 NOTE — Telephone Encounter (Signed)
Results left on voicemail as requested.   -- Notes Recorded by Mackie Pai, PA-C on 10/13/2016 at 6:40 PM EST Pt elbow xray normal no spur seen. Still presents like tennnis elbow. No spur seen likely since not enough inflammation has occurred or has not occurred for long enough time. Pt had this for about a month. So if by wed next week symptoms persist then refer to sports med or ortho. Ask pt to update [place] in one week.

## 2016-10-15 NOTE — Telephone Encounter (Signed)
Relation to [id number] Call back number:[phone]   Reason for call:  Patient inquiring about imaging results, patient states please leave detail message on voicemail

## 2016-11-09 MED FILL — DESOGESTR-ETH ESTRAD ETH ES: 0.15-0.02/0 | 84 days supply | Qty: 84 | Fill #2

## 2016-12-30 ENCOUNTER — Encounter: Payer: Self-pay | Admitting: Medical

## 2016-12-30 ENCOUNTER — Ambulatory Visit (INDEPENDENT_AMBULATORY_CARE_PROVIDER_SITE_OTHER): Payer: 59 | Admitting: Medical

## 2016-12-30 VITALS — BP 124/74 | HR 80 | Temp 98.2°F | Resp 16 | Ht 60.0 in | Wt 135.5 lb

## 2016-12-30 DIAGNOSIS — Z1239 Encounter for other screening for malignant neoplasm of breast: Secondary | ICD-10-CM

## 2016-12-30 DIAGNOSIS — M7712 Lateral epicondylitis, left elbow: Secondary | ICD-10-CM | POA: Diagnosis not present

## 2016-12-30 DIAGNOSIS — Z1231 Encounter for screening mammogram for malignant neoplasm of breast: Secondary | ICD-10-CM

## 2016-12-30 MED ORDER — DICLOFENAC SODIUM 75 MG PO TBEC: 75.0000 mg | DELAYED_RELEASE_TABLET | Freq: Two times a day (BID) | ORAL | 0 refills | Status: DC

## 2016-12-30 MED FILL — DICLOFENAC SOD 75 MG TAB EC: 75 | 10 days supply | Qty: 20 | Fill #0

## 2016-12-30 NOTE — Progress Notes (Signed)
Subjective:    Patient ID: Vickie Thompson, female    DOB: 06-03-1971, 46 y.o.   MRN: [id number]  HPI  Pt in for follow up. Pt has lt elbow pain going on for 4 months at least. Not getting better. Pain is level 5/10. Negative xray on last visit.  Pt uses tennis elbow brace when she works. I wrote pt diclofenac for short term. Then she used otc nsaids.(was just using ibuprofen 400 mg one time daily).  Pain daily with minimal use of upper ext.     Review of Systems  Constitutional: Negative for chills, fatigue and fever.  Respiratory: Negative for cough, choking and shortness of breath.   Cardiovascular: Negative for palpitations.  Musculoskeletal:       Elbow pain   Past Medical History:  Diagnosis Date  . Contraceptive management 07/13/2015  . Contraceptive management 07/13/2015  . Contraceptive management 07/13/2015   Menarche at 17 Regular and moderate flow No history of abnormal pap in past G2P2, s/p 2 SVD No history of abnormal MGM No concerns today No gyn surgeries LMP 12/15/2015, normal, 5 days   . H/O gestational diabetes mellitus, not currently pregnant   . Hyperlipidemia   . Preventative health care 01/05/2016     Social History   Social History  . Marital status: Married    Spouse name: N/A  . Number of children: N/A  . Years of education: N/A   Occupational History  . Not on file.   Social History Main Topics  . Smoking status: Never Smoker  . Smokeless tobacco: Never Used  . Alcohol use 0.0 oz/week     Comment: occasion  . Drug use: No  . 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 Not on file   Social History Narrative  . No narrative on file    No past surgical history on file.  Family History  Problem Relation Age of Onset  . Diabetes Father   . Hypertension Father   . Mental illness Mother    depression  . Hypertension Sister   . Gout Brother   . Cancer Maternal Aunt     lung    Allergies  Allergen Reactions  . Retinoids Shortness Of Breath  . Sudafed [Pseudoephedrine Hcl] Palpitations    Current Outpatient Prescriptions on File Prior to Visit  Medication Sig Dispense Refill  . acetaminophen (TYLENOL) 500 MG tablet Take 500 mg by mouth every 6 (six) hours as needed.    . desogestrel-ethinyl estradiol (KARIVA,AZURETTE,MIRCETTE) 0.15-0.02/0.01 MG (21/5) tablet TAKE 1 TABLET BY MOUTH DAILY. 84 tablet 3  . pantoprazole (PROTONIX) 40 MG tablet Take 1 tablet (40 mg total) by mouth daily. 90 tablet 1   No current facility-administered medications on file prior to visit.     BP 124/74 (BP Location: Left Arm, Patient Position: Sitting, Cuff Size: Normal)   Pulse 80   Temp 98.2 F (36.8 C) (Oral)   Resp 16   Ht 5' (1.524 m)   Wt 135 lb 8 oz (61.5 kg)   LMP 12/02/2016   SpO2 99%   BMI 26.46 kg/m       Objective:   Physical Exam  General- No acute distress. Pleasant patient. Neck- Full range of motion, no jvd Lungs- Clear, even and unlabored. Heart- regular rate and rhythm.  Neurologic- CNII- XII grossly intact. Left elbow- proximal lateral epicondyle pain on palpation and pain on range of motion.       Assessment & Plan:  For your your lateral epicondyle pain continue conservative measures. Will rx diclofenac.  Will refer you to sports medicine.   Follow up here as needed for elbow.  Mammogram order placed.  Attend CPE with Dr. Charlett Blake.  Geanine Vandekamp, Percell Miller, PA-C

## 2016-12-30 NOTE — Patient Instructions (Signed)
For your your lateral epicondyle pain continue conservative measures. Will rx diclofenac.  Will refer you to sports medicine.   Follow up here as needed for elbow.  Mammogram order placed.  Attend CPE with Dr. Charlett Blake.

## 2016-12-30 NOTE — Progress Notes (Signed)
Pre visit review using our clinic review tool, if applicable. No additional management support is needed unless otherwise documented below in the visit note/SLS  

## 2017-01-03 ENCOUNTER — Ambulatory Visit: Payer: 59 | Admitting: Family Medicine

## 2017-01-04 ENCOUNTER — Other Ambulatory Visit: Payer: 59

## 2017-01-05 ENCOUNTER — Other Ambulatory Visit (INDEPENDENT_AMBULATORY_CARE_PROVIDER_SITE_OTHER): Payer: 59

## 2017-01-05 ENCOUNTER — Encounter: Payer: Self-pay | Admitting: Family Medicine

## 2017-01-05 ENCOUNTER — Telehealth: Payer: Self-pay | Admitting: *Deleted

## 2017-01-05 DIAGNOSIS — E785 Hyperlipidemia, unspecified: Secondary | ICD-10-CM | POA: Diagnosis not present

## 2017-01-05 DIAGNOSIS — E559 Vitamin D deficiency, unspecified: Secondary | ICD-10-CM | POA: Diagnosis not present

## 2017-01-05 NOTE — Addendum Note (Signed)
Addended by: Harl Bowie on: 01/05/2017 10:18 AM   Modules accepted: Orders

## 2017-01-05 NOTE — Telephone Encounter (Signed)
error:[id number] ° °

## 2017-01-05 NOTE — Telephone Encounter (Signed)
Patient came in today to have blood work drawn.  She stated the blood work was for a CPE.  After looking in the chart there was no labs to be released.  Continued to check the chart and found that the pt was seen on (01/05/16) for Establish Care visit where future labs had been entered.  But the labs had expired (01/04/17).  Pt has not had a CPE this year and she is not scheduled for one.  Pt's blood was drawn but we need a order stating whether or not the blood can be ran, and dx for the blood work.  Please advise.//AB/CMA

## 2017-01-05 NOTE — Telephone Encounter (Signed)
Thanks as we discussed likely insurance would no longer consider her labs as preventative a year after appt. Cholesterol and vitamin D have payable diagnosi. Everything else needs an appt

## 2017-01-06 LAB — LIPID PANEL
CHOL/HDL RATIO: 5
Cholesterol: 284 mg/dL — ABNORMAL HIGH (ref 0–200)
HDL: 57.6 mg/dL (ref >39.00)
NONHDL: 226.2
Triglycerides: 236 mg/dL — ABNORMAL HIGH (ref 0.0–149.0)
VLDL: 47.2 mg/dL — AB (ref 0.0–40.0)

## 2017-01-06 LAB — LDL CHOLESTEROL, DIRECT: Direct LDL: 174 mg/dL

## 2017-01-06 LAB — VITAMIN D 25 HYDROXY (VIT D DEFICIENCY, FRACTURES): VITD: 20.87 ng/mL — AB (ref 30.00–100.00)

## 2017-01-06 NOTE — Addendum Note (Signed)
Addended by: Caffie Pinto on: 01/06/2017 11:04 AM   Modules accepted: Orders

## 2017-01-06 NOTE — Telephone Encounter (Signed)
Spoke with the pt and informed her of the message below.  Pt verbalized understanding and agreed.  Pt asked to have the Lipid and Vit D ran.  Pt has an appt scheduled for (05/02/17 @ 2:30pm) with Dr. Blyth.//AB/CMA

## 2017-01-06 NOTE — Telephone Encounter (Signed)
Called and [facility] @ 7:55am @ [phone]) asking the pt to RTC regarding labs that were drawn.  May not be able to run the blood work.//AB/CMA

## 2017-01-07 ENCOUNTER — Other Ambulatory Visit: Payer: Self-pay | Admitting: Family Medicine

## 2017-01-07 MED ORDER — VITAMIN D 50 MCG (2000 UT) PO CAPS: 1.0000 | ORAL_CAPSULE | Freq: Every day | ORAL | 11 refills | Status: DC

## 2017-01-07 MED ORDER — VITAMIN D (ERGOCALCIFEROL) 1.25 MG (50000 UNIT) PO CAPS: 50000.0000 [IU] | ORAL_CAPSULE | ORAL | 4 refills | Status: DC

## 2017-01-07 MED FILL — VITAMIN D 1,000 UNITS TAB: 25 MCG | 50 days supply | Qty: 100 | Fill #0

## 2017-01-07 MED FILL — VIT D2 1.25 MG (50,000 UNIT: 1.25 MG | 28 days supply | Qty: 4 | Fill #0

## 2017-01-10 ENCOUNTER — Encounter: Payer: Self-pay | Admitting: Family Medicine

## 2017-01-10 ENCOUNTER — Telehealth: Payer: Self-pay | Admitting: Family Medicine

## 2017-01-10 ENCOUNTER — Ambulatory Visit (INDEPENDENT_AMBULATORY_CARE_PROVIDER_SITE_OTHER): Payer: 59 | Admitting: Family Medicine

## 2017-01-10 DIAGNOSIS — M7712 Lateral epicondylitis, left elbow: Secondary | ICD-10-CM | POA: Diagnosis not present

## 2017-01-10 MED ORDER — NITROGLYCERIN 0.2 MG/HR TD PT24: MEDICATED_PATCH | TRANSDERMAL | 1 refills | Status: DC

## 2017-01-10 MED ORDER — CEPHALEXIN 500 MG PO CAPS: 500.0000 mg | ORAL_CAPSULE | Freq: Three times a day (TID) | ORAL | 0 refills | Status: DC

## 2017-01-10 MED ORDER — DICLOFENAC SODIUM 75 MG PO TBEC: 75.0000 mg | DELAYED_RELEASE_TABLET | Freq: Two times a day (BID) | ORAL | 1 refills | Status: DC

## 2017-01-10 MED FILL — CEPHALEXIN 500 MG CAPSULE: 500 | 7 days supply | Qty: 21 | Fill #0

## 2017-01-10 MED FILL — DICLOFENAC SOD 75 MG TAB EC: 75 | 30 days supply | Qty: 60 | Fill #0

## 2017-01-10 MED FILL — NITROGLYCERIN 0.2 MG/HR PAT: 0.2 | 88 days supply | Qty: 22 | Fill #0

## 2017-01-10 NOTE — Telephone Encounter (Signed)
Can have keflex 500 mg po tid x 7 days also cleanse area with hydrogen peroxide and warm compresses seek care if worsens. If fevers needs to be seen

## 2017-01-10 NOTE — Telephone Encounter (Signed)
Sent in prescription/called the patient to inform had to leave a detailed message to call back.

## 2017-01-10 NOTE — Telephone Encounter (Signed)
Patient informed of PCP instructions. And medication sent in.

## 2017-01-10 NOTE — Patient Instructions (Signed)
You have lateral epicondylitis Try to avoid painful activities as much as possible. Ice the area 3-4 times a day for 15 minutes at a time. Diclofenac 75mg  twice a day with food as needed for pain and inflammation. Counterforce brace as directed can help unload area - wear this regularly if it provides you with relief. Hammer rotation exercise, wrist extension exercise with 1 pound weight - 3 sets of 10 once a day.   Stretching - hold for 20-30 seconds and repeat 3 times. Nitro patches 1/4th patch over affected elbow, change daily. Consider physical therapy, injection if not improving. Follow up in 6 weeks.

## 2017-01-10 NOTE — Telephone Encounter (Signed)
°  Relation to [id number] Call back number: [phone] Pharmacy: [place], [place] - [address] [phone] (Phone) [phone] (Fax)    Reason for call:  Patent requesting antibiotics for swollen painful left groin boil that she popped this morning, patient declined appointment, until MD advises

## 2017-01-12 DIAGNOSIS — M7712 Lateral epicondylitis, left elbow: Secondary | ICD-10-CM

## 2017-01-12 HISTORY — DX: Lateral epicondylitis, left elbow: M77.12

## 2017-01-12 NOTE — Assessment & Plan Note (Signed)
shown home exercises to do daily and stretches.  Start nitro patches.  Continue diclofenac.  Consider physical therapy, injection, increased nitro dosage if not improving.  F/u in 6 weeks.

## 2017-01-12 NOTE — Progress Notes (Signed)
PCP: Penni Homans, MD Consultation requested by: Mackie Pai PA-C  Subjective:   HPI: Patient is a 46 y.o. female here for left elbow pain.  Patient reports she's had lateral left elbow pain dating back to at least December. Pain worse with movements of hand, full extension of elbow. Had radiographs negative for fracture or other abnormalities. Pain level is 5/10, sharp. No swelling. No skin changes or numbness. Has tried diclofenac, ibuprofen. She is right handed.  Past Medical History:  Diagnosis Date  . Contraceptive management 07/13/2015  . Contraceptive management 07/13/2015  . Contraceptive management 07/13/2015   Menarche at 17 Regular and moderate flow No history of abnormal pap in past G2P2, s/p 2 SVD No history of abnormal MGM No concerns today No gyn surgeries LMP 12/15/2015, normal, 5 days   . H/O gestational diabetes mellitus, not currently pregnant   . Hyperlipidemia   . Preventative health care 01/05/2016    Current Outpatient Prescriptions on File Prior to Visit  Medication Sig Dispense Refill  . acetaminophen (TYLENOL) 500 MG tablet Take 500 mg by mouth every 6 (six) hours as needed.    . Cholecalciferol (VITAMIN D) 2000 units CAPS Take 1 capsule (2,000 Units total) by mouth daily. 30 capsule 11  . desogestrel-ethinyl estradiol (KARIVA,AZURETTE,MIRCETTE) 0.15-0.02/0.01 MG (21/5) tablet TAKE 1 TABLET BY MOUTH DAILY. 84 tablet 3  . pantoprazole (PROTONIX) 40 MG tablet Take 1 tablet (40 mg total) by mouth daily. 90 tablet 1  . Vitamin D, Ergocalciferol, (DRISDOL) 50000 units CAPS capsule Take 1 capsule (50,000 Units total) by mouth every 7 (seven) days. 4 capsule 4   No current facility-administered medications on file prior to visit.     No past surgical history on file.  Allergies  Allergen Reactions  . Retinoids Shortness Of Breath  . Sudafed [Pseudoephedrine Hcl] Palpitations    Social History   Social History  . Marital status: Married    Spouse  name: N/A  . Number of children: N/A  . Years of education: N/A   Occupational History  . Not on file.   Social History Main Topics  . Smoking status: Never Smoker  . Smokeless tobacco: Never Used  . Alcohol use 0.0 oz/week     Comment: occasion  . Drug use: No  . 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 Not on file   Social History Narrative  . No narrative on file    Family History  Problem Relation Age of Onset  . Diabetes Father   . Hypertension Father   . Mental illness Mother     depression  . Hypertension Sister   . Gout Brother   . Cancer Maternal Aunt     lung    BP 128/78   Pulse 69   Ht 5' (1.524 m)   Wt 135 lb (61.2 kg)   BMI 26.37 kg/m   Review of Systems: See HPI above.     Objective:  Physical Exam:  Gen: NAD, comfortable in exam room  Left elbow: No gross deformity, swelling, bruising. TTP lateral epicondyle.  No other tenderness. FROM with pain on resisted elbow extension and 3rd digit extension. Collateral ligaments intact. Negative tinels at cubital tunnel. nvi distally.   Right elbow: FROM without pain.  Assessment & Plan:  1. Left lateral epicondylitis -  shown home exercises to do daily and stretches.  Start nitro patches.  Continue diclofenac.  Consider physical therapy, injection, increased nitro dosage if not improving.  F/u in 6 weeks.

## 2017-01-17 ENCOUNTER — Ambulatory Visit (HOSPITAL_BASED_OUTPATIENT_CLINIC_OR_DEPARTMENT_OTHER)
Admission: RE | Admit: 2017-01-17 | Discharge: 2017-01-17 | Disposition: A | Discharge status: Home / Self Care | Payer: 59 | Source: Ambulatory Visit | Attending: Medical | Admitting: Medical

## 2017-01-17 DIAGNOSIS — Z1231 Encounter for screening mammogram for malignant neoplasm of breast: Secondary | ICD-10-CM | POA: Diagnosis not present

## 2017-01-17 DIAGNOSIS — R921 Mammographic calcification found on diagnostic imaging of breast: Secondary | ICD-10-CM | POA: Diagnosis not present

## 2017-01-17 DIAGNOSIS — Z1239 Encounter for other screening for malignant neoplasm of breast: Secondary | ICD-10-CM

## 2017-01-17 MED FILL — DESOGESTR-ETH ESTRAD ETH ES: 0.15-0.02/0 | 84 days supply | Qty: 84 | Fill #3

## 2017-01-18 ENCOUNTER — Telehealth: Payer: Self-pay | Admitting: Medical

## 2017-01-18 NOTE — Telephone Encounter (Signed)
Opened to review 

## 2017-01-19 ENCOUNTER — Other Ambulatory Visit: Payer: Self-pay | Admitting: Internal Medicine

## 2017-01-19 DIAGNOSIS — N6320 Unspecified lump in the left breast, unspecified quadrant: Secondary | ICD-10-CM

## 2017-01-20 ENCOUNTER — Other Ambulatory Visit: Payer: Self-pay | Admitting: Medical

## 2017-01-20 DIAGNOSIS — R928 Other abnormal and inconclusive findings on diagnostic imaging of breast: Secondary | ICD-10-CM

## 2017-01-31 ENCOUNTER — Ambulatory Visit
Admission: RE | Admit: 2017-01-31 | Discharge: 2017-01-31 | Disposition: A | Discharge status: Home / Self Care | Payer: 59 | Source: Ambulatory Visit | Attending: Medical | Admitting: Medical

## 2017-01-31 DIAGNOSIS — R928 Other abnormal and inconclusive findings on diagnostic imaging of breast: Secondary | ICD-10-CM

## 2017-02-02 ENCOUNTER — Other Ambulatory Visit: Payer: 59

## 2017-02-02 ENCOUNTER — Encounter: Payer: Self-pay | Admitting: *Deleted

## 2017-02-02 NOTE — Progress Notes (Signed)
Letter Mailed/SLS 04/11

## 2017-02-24 MED FILL — DICLOFENAC SOD 75 MG TAB EC: 75 | 30 days supply | Qty: 60 | Fill #1

## 2017-02-28 ENCOUNTER — Ambulatory Visit: Payer: 59 | Admitting: Family Medicine

## 2017-03-15 MED FILL — ROSUVASTATIN CALCIUM 20 MG: 20 | 30 days supply | Qty: 30 | Fill #2

## 2017-05-02 ENCOUNTER — Other Ambulatory Visit: Payer: Self-pay | Admitting: Family Medicine

## 2017-05-02 ENCOUNTER — Ambulatory Visit (INDEPENDENT_AMBULATORY_CARE_PROVIDER_SITE_OTHER): Payer: 59 | Admitting: Family Medicine

## 2017-05-02 ENCOUNTER — Encounter: Payer: Self-pay | Admitting: Family Medicine

## 2017-05-02 VITALS — BP 102/68 | HR 68 | Temp 98.1°F | Resp 18 | Ht 61.0 in | Wt 134.6 lb

## 2017-05-02 DIAGNOSIS — E559 Vitamin D deficiency, unspecified: Secondary | ICD-10-CM | POA: Diagnosis not present

## 2017-05-02 DIAGNOSIS — E785 Hyperlipidemia, unspecified: Secondary | ICD-10-CM

## 2017-05-02 DIAGNOSIS — R739 Hyperglycemia, unspecified: Secondary | ICD-10-CM | POA: Diagnosis not present

## 2017-05-02 DIAGNOSIS — Z0000 Encounter for general adult medical examination without abnormal findings: Secondary | ICD-10-CM | POA: Diagnosis not present

## 2017-05-02 DIAGNOSIS — M7712 Lateral epicondylitis, left elbow: Secondary | ICD-10-CM

## 2017-05-02 MED ORDER — DICLOFENAC SODIUM 75 MG PO TBEC: 75.0000 mg | DELAYED_RELEASE_TABLET | Freq: Two times a day (BID) | ORAL | 5 refills | Status: DC | PRN

## 2017-05-02 MED FILL — DICLOFENAC SODIUM 75 MG TAB: 75 | 30 days supply | Qty: 60 | Fill #0

## 2017-05-02 MED FILL — VIORELE 28 DAY TABLET: 0.15-0.02/0 | 84 days supply | Qty: 84 | Fill #0

## 2017-05-02 NOTE — Patient Instructions (Addendum)
Tylenol ES 500 mg tabs,1-2 tabs twice daily  DASH or MIND diet  Preventive Care 40-64 Years, Female Preventive care refers to lifestyle choices and visits with your health care provider that can promote health and wellness. What does preventive care include?  A yearly physical exam. This is also called an annual well check.  Dental exams once or twice a year.  Routine eye exams. Ask your health care provider how often you should have your eyes checked.  Personal lifestyle choices, including: ? Daily care of your teeth and gums. ? Regular physical activity. ? Eating a healthy diet. ? Avoiding tobacco and drug use. ? Limiting alcohol use. ? Practicing safe sex. ? Taking low-dose aspirin daily starting at age 65. ? Taking vitamin and mineral supplements as recommended by your health care provider. What happens during an annual well check? The services and screenings done by your health care provider during your annual well check will depend on your age, overall health, lifestyle risk factors, and family history of disease. Counseling Your health care provider may ask you questions about your:  Alcohol use.  Tobacco use.  Drug use.  Emotional well-being.  Home and relationship well-being.  Sexual activity.  Eating habits.  Work and work Statistician.  Method of birth control.  Menstrual cycle.  Pregnancy history.  Screening You may have the following tests or measurements:  Height, weight, and BMI.  Blood pressure.  Lipid and cholesterol levels. These may be checked every 5 years, or more frequently if you are over 48 years old.  Skin check.  Lung cancer screening. You may have this screening every year starting at age 89 if you have a 30-pack-year history of smoking and currently smoke or have quit within the past 15 years.  Fecal occult blood test (FOBT) of the stool. You may have this test every year starting at age 55.  Flexible sigmoidoscopy or  colonoscopy. You may have a sigmoidoscopy every 5 years or a colonoscopy every 10 years starting at age 64.  Hepatitis C blood test.  Hepatitis B blood test.  Sexually transmitted disease (STD) testing.  Diabetes screening. This is done by checking your blood sugar (glucose) after you have not eaten for a while (fasting). You may have this done every 1-3 years.  Mammogram. This may be done every 1-2 years. Talk to your health care provider about when you should start having regular mammograms. This may depend on whether you have a family history of breast cancer.  BRCA-related cancer screening. This may be done if you have a family history of breast, ovarian, tubal, or peritoneal cancers.  Pelvic exam and Pap test. This may be done every 3 years starting at age 89. Starting at age 45, this may be done every 5 years if you have a Pap test in combination with an HPV test.  Bone density scan. This is done to screen for osteoporosis. You may have this scan if you are at high risk for osteoporosis.  Discuss your test results, treatment options, and if necessary, the need for more tests with your health care provider. Vaccines Your health care provider may recommend certain vaccines, such as:  Influenza vaccine. This is recommended every year.  Tetanus, diphtheria, and acellular pertussis (Tdap, Td) vaccine. You may need a Td booster every 10 years.  Varicella vaccine. You may need this if you have not been vaccinated.  Zoster vaccine. You may need this after age 49.  Measles, mumps, and rubella (MMR) vaccine.  You may need at least one dose of MMR if you were born in 1957 or later. You may also need a second dose.  Pneumococcal 13-valent conjugate (PCV13) vaccine. You may need this if you have certain conditions and were not previously vaccinated.  Pneumococcal polysaccharide (PPSV23) vaccine. You may need one or two doses if you smoke cigarettes or if you have certain  conditions.  Meningococcal vaccine. You may need this if you have certain conditions.  Hepatitis A vaccine. You may need this if you have certain conditions or if you travel or work in places where you may be exposed to hepatitis A.  Hepatitis B vaccine. You may need this if you have certain conditions or if you travel or work in places where you may be exposed to hepatitis B.  Haemophilus influenzae type b (Hib) vaccine. You may need this if you have certain conditions.  Talk to your health care provider about which screenings and vaccines you need and how often you need them. This information is not intended to replace advice given to you by your health care provider. Make sure you discuss any questions you have with your health care provider. Document Released: 11/07/2015 Document Revised: 06/30/2016 Document Reviewed: 08/12/2015 Elsevier Interactive Patient Education  2017 [employer].

## 2017-05-02 NOTE — Progress Notes (Signed)
Subjective:  I acted as a Education administrator for Dr. Charlett Blake. [place], [place]  Patient ID: Vickie Thompson, female    DOB: [birth year], 46 y.o.   MRN: [id number]  No chief complaint on file.   HPI  Patient is in today for an annual exam. Patient c/o her left elbow still has pain. She was seen by sports medicine and diagnosed with lateral epicondylitis. Treatment was helpful in her pain is significantly improved but does still occur most days. She reports good response to diclofenac and she is requesting a refill. She denies any other recent febrile illness or hospitalization. She tries to maintain a heart healthy diet and stay active. Denies CP/palp/SOB/HA/congestion/fevers/GI or GU c/o. Taking meds as prescribed  Patient Care Team: Mosie Lukes, MD as PCP - General (Family Medicine)   Past Medical History:  Diagnosis Date  . Contraceptive management 07/13/2015  . Contraceptive management 07/13/2015  . Contraceptive management 07/13/2015   Menarche at 17 Regular and moderate flow No history of abnormal pap in past G2P2, s/p 2 SVD No history of abnormal MGM No concerns today No gyn surgeries LMP 12/15/2015, normal, 5 days   . H/O gestational diabetes mellitus, not currently pregnant   . Hyperglycemia 05/03/2017  . Hyperlipidemia   . Preventative health care 01/05/2016    No past surgical history on file.  Family History  Problem Relation Age of Onset  . Diabetes Father   . Hypertension Father   . Mental illness Mother        depression  . Hypertension Sister   . Gout Brother   . Cancer Maternal Aunt        lung    Social History   Social History  . Marital status: Married    Spouse name: N/A  . Number of children: N/A  . Years of education: N/A   Occupational History  . Not on file.   Social History Main Topics  . Smoking status: Never Smoker  . Smokeless tobacco: Never Used  . Alcohol use 0.0 oz/week     Comment: occasion  . Drug use: No  . Sexual activity: Yes    Partners: Male     Birth control/ protection: Pill     Comment: lives with husband and kids, works as Marine scientist, at [employer] restrictions   Other Topics Concern  . Not on file   Social History Narrative  . No narrative on file    Outpatient Medications Prior to Visit  Medication Sig Dispense Refill  . acetaminophen (TYLENOL) 500 MG tablet Take 500 mg by mouth every 6 (six) hours as needed.    . Cholecalciferol (VITAMIN D) 2000 units CAPS Take 1 capsule (2,000 Units total) by mouth daily. 30 capsule 11  . desogestrel-ethinyl estradiol (KARIVA,AZURETTE,MIRCETTE) 0.15-0.02/0.01 MG (21/5) tablet TAKE 1 TABLET BY MOUTH DAILY. 84 tablet 3  . nitroGLYCERIN (NITRODUR - DOSED IN MG/24 HR) 0.2 mg/hr patch Apply 1/4th patch to affected elbow, change daily 30 patch 1  . pantoprazole (PROTONIX) 40 MG tablet Take 1 tablet (40 mg total) by mouth daily. 90 tablet 1  . Vitamin D, Ergocalciferol, (DRISDOL) 50000 units CAPS capsule Take 1 capsule (50,000 Units total) by mouth every 7 (seven) days. 4 capsule 4  . cephALEXin (KEFLEX) 500 MG capsule Take 1 capsule (500 mg total) by mouth 3 (three) times daily. Take for 7 days 21 capsule 0  . diclofenac (VOLTAREN) 75 MG EC tablet Take 1 tablet (75 mg total) by mouth 2 (two)  times daily. 60 tablet 1   No facility-administered medications prior to visit.     Allergies  Allergen Reactions  . Retinoids Shortness Of Breath  . Sudafed [Pseudoephedrine Hcl] Palpitations    Review of Systems  Constitutional: Negative for fever and malaise/fatigue.  HENT: Negative for congestion.   Eyes: Negative for blurred vision.  Respiratory: Negative for cough and shortness of breath.   Cardiovascular: Negative for chest pain, palpitations and leg swelling.  Gastrointestinal: Negative for blood in stool and vomiting.  Musculoskeletal: Positive for joint pain. Negative for back pain.  Skin: Negative for rash.  Neurological: Negative for loss of consciousness and headaches.         Objective:    Physical Exam  Constitutional: She is oriented to person, place, and time. She appears well-developed and well-nourished. No distress.  HENT:  Head: Normocephalic and atraumatic.  Eyes: Conjunctivae are normal.  Neck: Normal range of motion. No thyromegaly present.  Cardiovascular: Normal rate and regular rhythm.   Pulmonary/Chest: Effort normal and breath sounds normal. She has no wheezes.  Abdominal: Soft. Bowel sounds are normal. There is no tenderness.  Musculoskeletal: Normal range of motion. She exhibits no edema or deformity.  Neurological: She is alert and oriented to person, place, and time.  Skin: Skin is warm and dry. She is not diaphoretic.  Psychiatric: She has a normal mood and affect.    BP 102/68 (BP Location: Left Arm, Patient Position: Sitting, Cuff Size: Normal)   Pulse 68   Temp 98.1 F (36.7 C) (Oral)   Resp 18   Ht 5\' 1"  (1.549 m)   Wt 134 lb 9.6 oz (61.1 kg)   LMP 05/02/2017   SpO2 98%   BMI 25.43 kg/m  Wt Readings from Last 3 Encounters:  05/02/17 134 lb 9.6 oz (61.1 kg)  01/10/17 135 lb (61.2 kg)  12/30/16 135 lb 8 oz (61.5 kg)   BP Readings from Last 3 Encounters:  05/02/17 102/68  01/10/17 128/78  12/30/16 124/74     Immunization History  Administered Date(s) Administered  . Influenza,inj,Quad PF,36+ Mos 07/26/2015  . Influenza-Unspecified 07/24/2014    Health Maintenance  Topic Date Due  . HIV Screening  01/30/1987  . INFLUENZA VACCINE  05/25/2017  . MAMMOGRAM  01/17/2018  . PAP SMEAR  01/05/2019  . TETANUS/TDAP  02/08/2022    Lab Results  Component Value Date   WBC 7.0 01/06/2015   HGB 13.5 01/06/2015   HCT 41.1 01/06/2015   PLT 345 01/06/2015   GLUCOSE 94 01/06/2015   CHOL 284 (H) 01/06/2017   TRIG 236.0 (H) 01/06/2017   HDL 57.60 01/06/2017   LDLDIRECT 174.0 01/06/2017   LDLCALC 176 (H) 01/06/2015   ALT 14 01/06/2015   AST 20 01/06/2015   NA 140 01/06/2015   K 3.7 01/06/2015   CL 102 01/06/2015    CREATININE 0.63 01/06/2015   BUN 11 01/06/2015   CO2 24 01/06/2015   TSH 1.853 01/06/2015    Lab Results  Component Value Date   TSH 1.853 01/06/2015   Lab Results  Component Value Date   WBC 7.0 01/06/2015   HGB 13.5 01/06/2015   HCT 41.1 01/06/2015   MCV 89.3 01/06/2015   PLT 345 01/06/2015   Lab Results  Component Value Date   NA 140 01/06/2015   K 3.7 01/06/2015   CO2 24 01/06/2015   GLUCOSE 94 01/06/2015   BUN 11 01/06/2015   CREATININE 0.63 01/06/2015   BILITOT 0.6 01/06/2015  ALKPHOS 66 01/06/2015   AST 20 01/06/2015   ALT 14 01/06/2015   PROT 7.3 01/06/2015   ALBUMIN 4.3 01/06/2015   CALCIUM 9.4 01/06/2015   Lab Results  Component Value Date   CHOL 284 (H) 01/06/2017   Lab Results  Component Value Date   HDL 57.60 01/06/2017   Lab Results  Component Value Date   LDLCALC 176 (H) 01/06/2015   Lab Results  Component Value Date   TRIG 236.0 (H) 01/06/2017   Lab Results  Component Value Date   CHOLHDL 5 01/06/2017   No results found for: HGBA1C       Assessment & Plan:   Problem List Items Addressed This Visit    Hyperlipidemia    Encouraged heart healthy diet, increase exercise, avoid trans fats, consider a krill oil cap daily      Relevant Medications   rosuvastatin (CRESTOR) 20 MG tablet   Other Relevant Orders   Lipid panel   Vitamin D deficiency - Primary    Continue supplements and monitor      Relevant Orders   Comprehensive metabolic panel   Preventative health care    Patient encouraged to maintain heart healthy diet, regular exercise, adequate sleep. Consider daily probiotics. Take medications as prescribed      Relevant Orders   CBC   TSH   Lateral epicondylitis, left elbow    Was seen by sports med and has improved although she does still have discomfort that responds to diclofenac. She denies any side effects and requests a refill, she is advised to use it as little as possible to control her symptoms due to risk of  side effects with increased use      Relevant Medications   diclofenac (VOLTAREN) 75 MG EC tablet   Hyperglycemia    minimize simple carbs. Increase exercise as tolerated. Check labs      Relevant Orders   Hemoglobin A1c      I have discontinued Ms. Hustead's cephALEXin. I have also changed her diclofenac. Additionally, I am having her maintain her acetaminophen, pantoprazole, Vitamin D (Ergocalciferol), Vitamin D, nitroGLYCERIN, desogestrel-ethinyl estradiol, and rosuvastatin.  Meds ordered this encounter  Medications  . rosuvastatin (CRESTOR) 20 MG tablet    Sig: Take 1 tablet by mouth at bedtime.    Refill:  3  . diclofenac (VOLTAREN) 75 MG EC tablet    Sig: Take 1 tablet (75 mg total) by mouth 2 (two) times daily as needed.    Dispense:  60 tablet    Refill:  5    CMA served as scribe during this visit. History, Physical and Plan performed by medical provider. Documentation and orders reviewed and attested to.  Penni Homans, MD

## 2017-05-03 ENCOUNTER — Encounter: Payer: Self-pay | Admitting: Family Medicine

## 2017-05-03 DIAGNOSIS — R739 Hyperglycemia, unspecified: Secondary | ICD-10-CM

## 2017-05-03 HISTORY — DX: Hyperglycemia, unspecified: R73.9

## 2017-05-03 LAB — CBC
HEMATOCRIT: 40.7 % (ref 36.0–46.0)
Hemoglobin: 13.5 g/dL (ref 12.0–15.0)
MCHC: 33.1 g/dL (ref 30.0–36.0)
MCV: 90.5 fl (ref 78.0–100.0)
PLATELETS: 297 10*3/uL (ref 150.0–400.0)
RBC: 4.49 Mil/uL (ref 3.87–5.11)
RDW: 12.5 % (ref 11.5–15.5)
WBC: 8.1 10*3/uL (ref 4.0–10.5)

## 2017-05-03 LAB — TSH: TSH: 0.98 u[IU]/mL (ref 0.35–4.50)

## 2017-05-03 LAB — COMPREHENSIVE METABOLIC PANEL
ALBUMIN: 4 g/dL (ref 3.5–5.2)
ALT: 19 U/L (ref 0–35)
AST: 23 U/L (ref 0–37)
Alkaline Phosphatase: 66 U/L (ref 39–117)
BUN: 11 mg/dL (ref 6–23)
CALCIUM: 9.4 mg/dL (ref 8.4–10.5)
CHLORIDE: 102 meq/L (ref 96–112)
CO2: 26 mEq/L (ref 19–32)
Creatinine, Ser: 0.62 mg/dL (ref 0.40–1.20)
GFR: 110.51 mL/min (ref >60.00)
Glucose, Bld: 101 mg/dL — ABNORMAL HIGH (ref 70–99)
POTASSIUM: 3.6 meq/L (ref 3.5–5.1)
SODIUM: 138 meq/L (ref 135–145)
Total Bilirubin: 0.5 mg/dL (ref 0.2–1.2)
Total Protein: 7.1 g/dL (ref 6.0–8.3)

## 2017-05-03 LAB — LIPID PANEL
CHOLESTEROL: 247 mg/dL — AB (ref 0–200)
HDL: 63.5 mg/dL (ref >39.00)
LDL Cholesterol: 155 mg/dL — ABNORMAL HIGH (ref 0–99)
NonHDL: 183.53
Total CHOL/HDL Ratio: 4
Triglycerides: 145 mg/dL (ref 0.0–149.0)
VLDL: 29 mg/dL (ref 0.0–40.0)

## 2017-05-03 LAB — HEMOGLOBIN A1C: Hgb A1c MFr Bld: 5.7 % (ref 4.6–6.5)

## 2017-05-03 NOTE — Assessment & Plan Note (Signed)
Continue supplements and monitor 

## 2017-05-03 NOTE — Assessment & Plan Note (Signed)
minimize simple carbs. Increase exercise as tolerated. Check labs 

## 2017-05-03 NOTE — Assessment & Plan Note (Signed)
Patient encouraged to maintain heart healthy diet, regular exercise, adequate sleep. Consider daily probiotics. Take medications as prescribed 

## 2017-05-03 NOTE — Assessment & Plan Note (Signed)
Encouraged heart healthy diet, increase exercise, avoid trans fats, consider a krill oil cap daily 

## 2017-05-03 NOTE — Assessment & Plan Note (Signed)
Was seen by sports med and has improved although she does still have discomfort that responds to diclofenac. She denies any side effects and requests a refill, she is advised to use it as little as possible to control her symptoms due to risk of side effects with increased use

## 2017-05-04 ENCOUNTER — Other Ambulatory Visit: Payer: Self-pay | Admitting: *Deleted

## 2017-05-04 ENCOUNTER — Encounter: Payer: Self-pay | Admitting: *Deleted

## 2017-05-04 DIAGNOSIS — E785 Hyperlipidemia, unspecified: Secondary | ICD-10-CM

## 2017-05-04 MED ORDER — ROSUVASTATIN CALCIUM 40 MG PO TABS
40.0000 mg | ORAL_TABLET | Freq: Every day | ORAL | 3 refills | Status: DC
Start: 2017-05-04 — End: 2017-10-05

## 2017-05-04 MED FILL — ROSUVASTATIN CALCIUM 40 MG: 40 | 90 days supply | Qty: 90 | Fill #0

## 2017-07-21 MED FILL — PIMTREA 28 DAY TABLET: 0.15-0.02/0 | 84 days supply | Qty: 84 | Fill #1

## 2017-08-08 ENCOUNTER — Other Ambulatory Visit: Payer: 59

## 2017-08-10 ENCOUNTER — Other Ambulatory Visit (INDEPENDENT_AMBULATORY_CARE_PROVIDER_SITE_OTHER): Payer: 59

## 2017-08-10 DIAGNOSIS — E785 Hyperlipidemia, unspecified: Secondary | ICD-10-CM

## 2017-08-10 LAB — LIPID PANEL
CHOLESTEROL: 183 mg/dL (ref 0–200)
HDL: 72.5 mg/dL (ref >39.00)
LDL Cholesterol: 73 mg/dL (ref 0–99)
NONHDL: 110.58
Total CHOL/HDL Ratio: 3
Triglycerides: 189 mg/dL — ABNORMAL HIGH (ref 0.0–149.0)
VLDL: 37.8 mg/dL (ref 0.0–40.0)

## 2017-08-23 MED FILL — ROSUVASTATIN CALCIUM 40 MG: 40 | 30 days supply | Qty: 30 | Fill #1

## 2017-10-05 ENCOUNTER — Other Ambulatory Visit: Payer: Self-pay | Admitting: Family Medicine

## 2017-10-05 MED FILL — PIMTREA 28 DAY TABLET: 0.15-0.02/0 | 84 days supply | Qty: 84 | Fill #2

## 2017-10-07 MED FILL — ROSUVASTATIN CALCIUM 40 MG: 40 | 90 days supply | Qty: 90 | Fill #0

## 2017-10-13 DIAGNOSIS — H524 Presbyopia: Secondary | ICD-10-CM | POA: Diagnosis not present

## 2017-10-13 DIAGNOSIS — H52222 Regular astigmatism, left eye: Secondary | ICD-10-CM | POA: Diagnosis not present

## 2017-10-13 DIAGNOSIS — H5213 Myopia, bilateral: Secondary | ICD-10-CM | POA: Diagnosis not present

## 2017-11-30 ENCOUNTER — Other Ambulatory Visit: Payer: Self-pay | Admitting: Medical

## 2017-12-14 MED FILL — PANTOPRAZOLE SOD DR 40 MG T: 40 | 90 days supply | Qty: 90 | Fill #0

## 2018-01-09 MED FILL — PIMTREA 28 DAY TABLET: 0.15-0.02/0 | 84 days supply | Qty: 84 | Fill #3

## 2018-02-15 MED FILL — ROSUVASTATIN CALCIUM 40 MG: 40 | 30 days supply | Qty: 30 | Fill #1

## 2018-03-27 ENCOUNTER — Other Ambulatory Visit: Payer: Self-pay | Admitting: Family Medicine

## 2018-03-27 MED FILL — ROSUVASTATIN CALCIUM 40 MG: 40 | 30 days supply | Qty: 30 | Fill #0

## 2018-04-03 ENCOUNTER — Other Ambulatory Visit: Payer: Self-pay | Admitting: Family Medicine

## 2018-04-03 MED FILL — PIMTREA 28 DAY TABLET: 0.15-0.02/0 | 84 days supply | Qty: 84 | Fill #0

## 2018-05-19 ENCOUNTER — Encounter: Payer: 59 | Admitting: Family Medicine

## 2018-05-30 ENCOUNTER — Ambulatory Visit (INDEPENDENT_AMBULATORY_CARE_PROVIDER_SITE_OTHER): Payer: 59 | Admitting: Family Medicine

## 2018-05-30 VITALS — BP 115/92 | HR 76

## 2018-05-30 DIAGNOSIS — R739 Hyperglycemia, unspecified: Secondary | ICD-10-CM | POA: Diagnosis not present

## 2018-05-30 DIAGNOSIS — Z1239 Encounter for other screening for malignant neoplasm of breast: Secondary | ICD-10-CM

## 2018-05-30 DIAGNOSIS — K625 Hemorrhage of anus and rectum: Secondary | ICD-10-CM

## 2018-05-30 DIAGNOSIS — Z1231 Encounter for screening mammogram for malignant neoplasm of breast: Secondary | ICD-10-CM

## 2018-05-30 DIAGNOSIS — E785 Hyperlipidemia, unspecified: Secondary | ICD-10-CM

## 2018-05-30 DIAGNOSIS — E559 Vitamin D deficiency, unspecified: Secondary | ICD-10-CM | POA: Diagnosis not present

## 2018-05-30 DIAGNOSIS — M25569 Pain in unspecified knee: Secondary | ICD-10-CM | POA: Diagnosis not present

## 2018-05-30 DIAGNOSIS — Z0000 Encounter for general adult medical examination without abnormal findings: Secondary | ICD-10-CM

## 2018-05-30 MED ORDER — HYDROCORTISONE ACETATE 25 MG RE SUPP: 25.0000 mg | Freq: Two times a day (BID) | RECTAL | 1 refills | Status: DC

## 2018-05-30 MED ORDER — DESOGESTREL-ETHINYL ESTRADIOL 0.15-0.02/0.01 MG (21/5) PO TABS: 1.0000 | ORAL_TABLET | Freq: Every day | ORAL | 3 refills | Status: DC

## 2018-05-30 MED ORDER — POLYETHYLENE GLYCOL 3350 17 GM/SCOOP PO POWD: 17.0000 g | Freq: Every day | ORAL | 5 refills | Status: DC | PRN

## 2018-05-30 MED ORDER — BENEFIBER PO POWD: 1.0000 | Freq: Every day | ORAL | 5 refills | Status: DC | PRN

## 2018-05-30 MED ORDER — ROSUVASTATIN CALCIUM 40 MG PO TABS: 40.0000 mg | ORAL_TABLET | Freq: Every day | ORAL | 3 refills | Status: DC

## 2018-05-30 MED FILL — ROSUVASTATIN CALCIUM 40 MG: 40 | 30 days supply | Qty: 30 | Fill #0

## 2018-05-30 NOTE — Patient Instructions (Addendum)
Curcumen can help knee Glucosamine Chondroitin   DASH or MIND or Satiety (keto and mediterranean) diet  Vitamin D 1000 to 2000 IU daily   Preventive Care 40-64 Years, Female Preventive care refers to lifestyle choices and visits with your health care provider that can promote health and wellness. What does preventive care include?  A yearly physical exam. This is also called an annual well check.  Dental exams once or twice a year.  Routine eye exams. Ask your health care provider how often you should have your eyes checked.  Personal lifestyle choices, including: ? Daily care of your teeth and gums. ? Regular physical activity. ? Eating a healthy diet. ? Avoiding tobacco and drug use. ? Limiting alcohol use. ? Practicing safe sex. ? Taking low-dose aspirin daily starting at age 23. ? Taking vitamin and mineral supplements as recommended by your health care provider. What happens during an annual well check? The services and screenings done by your health care provider during your annual well check will depend on your age, overall health, lifestyle risk factors, and family history of disease. Counseling Your health care provider may ask you questions about your:  Alcohol use.  Tobacco use.  Drug use.  Emotional well-being.  Home and relationship well-being.  Sexual activity.  Eating habits.  Work and work Statistician.  Method of birth control.  Menstrual cycle.  Pregnancy history.  Screening You may have the following tests or measurements:  Height, weight, and BMI.  Blood pressure.  Lipid and cholesterol levels. These may be checked every 5 years, or more frequently if you are over 10 years old.  Skin check.  Lung cancer screening. You may have this screening every year starting at age 43 if you have a 30-pack-year history of smoking and currently smoke or have quit within the past 15 years.  Fecal occult blood test (FOBT) of the stool. You may have  this test every year starting at age 30.  Flexible sigmoidoscopy or colonoscopy. You may have a sigmoidoscopy every 5 years or a colonoscopy every 10 years starting at age 39.  Hepatitis C blood test.  Hepatitis B blood test.  Sexually transmitted disease (STD) testing.  Diabetes screening. This is done by checking your blood sugar (glucose) after you have not eaten for a while (fasting). You may have this done every 1-3 years.  Mammogram. This may be done every 1-2 years. Talk to your health care provider about when you should start having regular mammograms. This may depend on whether you have a family history of breast cancer.  BRCA-related cancer screening. This may be done if you have a family history of breast, ovarian, tubal, or peritoneal cancers.  Pelvic exam and Pap test. This may be done every 3 years starting at age 51. Starting at age 22, this may be done every 5 years if you have a Pap test in combination with an HPV test.  Bone density scan. This is done to screen for osteoporosis. You may have this scan if you are at high risk for osteoporosis.  Discuss your test results, treatment options, and if necessary, the need for more tests with your health care provider. Vaccines Your health care provider may recommend certain vaccines, such as:  Influenza vaccine. This is recommended every year.  Tetanus, diphtheria, and acellular pertussis (Tdap, Td) vaccine. You may need a Td booster every 10 years.  Varicella vaccine. You may need this if you have not been vaccinated.  Zoster vaccine. You  may need this after age 59.  Measles, mumps, and rubella (MMR) vaccine. You may need at least one dose of MMR if you were born in 1957 or later. You may also need a second dose.  Pneumococcal 13-valent conjugate (PCV13) vaccine. You may need this if you have certain conditions and were not previously vaccinated.  Pneumococcal polysaccharide (PPSV23) vaccine. You may need one or two  doses if you smoke cigarettes or if you have certain conditions.  Meningococcal vaccine. You may need this if you have certain conditions.  Hepatitis A vaccine. You may need this if you have certain conditions or if you travel or work in places where you may be exposed to hepatitis A.  Hepatitis B vaccine. You may need this if you have certain conditions or if you travel or work in places where you may be exposed to hepatitis B.  Haemophilus influenzae type b (Hib) vaccine. You may need this if you have certain conditions.  Talk to your health care provider about which screenings and vaccines you need and how often you need them. This information is not intended to replace advice given to you by your health care provider. Make sure you discuss any questions you have with your health care provider. Document Released: 11/07/2015 Document Revised: 06/30/2016 Document Reviewed: 08/12/2015 Elsevier Interactive Patient Education  [employer].

## 2018-05-31 ENCOUNTER — Other Ambulatory Visit: Payer: Self-pay

## 2018-05-31 ENCOUNTER — Telehealth: Payer: Self-pay | Admitting: Family Medicine

## 2018-05-31 LAB — CBC
HEMATOCRIT: 40.8 % (ref 36.0–46.0)
Hemoglobin: 13.7 g/dL (ref 12.0–15.0)
MCHC: 33.5 g/dL (ref 30.0–36.0)
MCV: 90.7 fl (ref 78.0–100.0)
Platelets: 303 10*3/uL (ref 150.0–400.0)
RBC: 4.49 Mil/uL (ref 3.87–5.11)
RDW: 12.6 % (ref 11.5–15.5)
WBC: 9 10*3/uL (ref 4.0–10.5)

## 2018-05-31 LAB — LIPID PANEL
CHOL/HDL RATIO: 3
Cholesterol: 209 mg/dL — ABNORMAL HIGH (ref 0–200)
HDL: 62.7 mg/dL (ref >39.00)
NONHDL: 145.81
Triglycerides: 228 mg/dL — ABNORMAL HIGH (ref 0.0–149.0)
VLDL: 45.6 mg/dL — AB (ref 0.0–40.0)

## 2018-05-31 LAB — COMPREHENSIVE METABOLIC PANEL
ALBUMIN: 4.4 g/dL (ref 3.5–5.2)
ALT: 22 U/L (ref 0–35)
AST: 21 U/L (ref 0–37)
Alkaline Phosphatase: 60 U/L (ref 39–117)
BUN: 14 mg/dL (ref 6–23)
CHLORIDE: 102 meq/L (ref 96–112)
CO2: 28 meq/L (ref 19–32)
Calcium: 9.8 mg/dL (ref 8.4–10.5)
Creatinine, Ser: 0.72 mg/dL (ref 0.40–1.20)
GFR: 92.55 mL/min (ref >60.00)
GLUCOSE: 87 mg/dL (ref 70–99)
POTASSIUM: 3.6 meq/L (ref 3.5–5.1)
SODIUM: 139 meq/L (ref 135–145)
Total Bilirubin: 0.6 mg/dL (ref 0.2–1.2)
Total Protein: 7.3 g/dL (ref 6.0–8.3)

## 2018-05-31 LAB — VITAMIN D 25 HYDROXY (VIT D DEFICIENCY, FRACTURES): VITD: 29.08 ng/mL — AB (ref 30.00–100.00)

## 2018-05-31 LAB — LDL CHOLESTEROL, DIRECT: Direct LDL: 112 mg/dL

## 2018-05-31 LAB — TSH: TSH: 1.61 u[IU]/mL (ref 0.35–4.50)

## 2018-05-31 LAB — HEMOGLOBIN A1C: Hgb A1c MFr Bld: 6.1 % (ref 4.6–6.5)

## 2018-05-31 MED ORDER — HYDROCORTISONE ACETATE 25 MG RE SUPP: 25.0000 mg | Freq: Two times a day (BID) | RECTAL | 0 refills | Status: DC

## 2018-05-31 NOTE — Telephone Encounter (Signed)
Copied from [place] [id number]. Topic: Quick Communication - Rx Refill/Question >> May 31, 2018 12:58 PM Keene Breath wrote: Medication: hydrocortisone (ANUSOL-HC) 25 MG suppository  Patient called to request refill for the above medication.  CB# [phone]  Preferred Pharmacy (with phone number or street name): [place], [place] - [address] [phone] (Phone) [phone] (Fax)

## 2018-06-04 DIAGNOSIS — K625 Hemorrhage of anus and rectum: Secondary | ICD-10-CM | POA: Insufficient documentation

## 2018-06-04 DIAGNOSIS — M25569 Pain in unspecified knee: Secondary | ICD-10-CM | POA: Insufficient documentation

## 2018-06-04 HISTORY — DX: Pain in unspecified knee: M25.569

## 2018-06-04 HISTORY — DX: Hemorrhage of anus and rectum: K62.5

## 2018-06-04 NOTE — Assessment & Plan Note (Signed)
Encouraged moist heat and gentle stretching as tolerated. May try NSAIDs and prescription meds as directed and report if symptoms worsen or seek immediate care 

## 2018-06-04 NOTE — Assessment & Plan Note (Signed)
Patient encouraged to maintain heart healthy diet, regular exercise, adequate sleep. Consider daily probiotics. Take medications as prescribed. MGM ordered. Labs reviewed

## 2018-06-04 NOTE — Assessment & Plan Note (Signed)
Supplement and monitor 

## 2018-06-04 NOTE — Assessment & Plan Note (Signed)
Given rx for Anusol [name] suppositories and referred to gastroenterology for further evaluation

## 2018-06-04 NOTE — Assessment & Plan Note (Signed)
Tolerating statin, encouraged heart healthy diet, avoid trans fats, minimize simple carbs and saturated fats. Increase exercise as tolerated 

## 2018-06-04 NOTE — Assessment & Plan Note (Signed)
hgba1c acceptable, minimize simple carbs. Increase exercise as tolerated.  

## 2018-06-04 NOTE — Progress Notes (Signed)
Subjective:    Patient ID: Vickie Thompson, female    DOB: 02-23-1971, 47 y.o.   MRN: [id number]  No chief complaint on file.   HPI Patient is in today for annual preventative exam and follow up on chronic medical concerns including hyperglycemia, hypertension, hyperlipidemia and vitamin D deficiency.  She reports generally feeling well at the present time.  Denies any significant trouble with abdominal pain or constipation but is noting some blood on the tissue when she wipes at times.  Usually it is after some straining.  No recent febrile illness or hospitalizations.  She is doing well with her activities of daily living despite some knee pain.  No falls or trauma.  No redness warmth or swelling in the knees.  She is trying to maintain a heart healthy diet and stay active although her knees do make this difficult at times. Denies CP/palp/SOB/HA/congestion/fevers/GI or GU c/o. Taking meds as prescribed. Home BP monitor shows unmbers generally in the 110s over 70s  Past Medical History:  Diagnosis Date  . Contraceptive management 07/13/2015  . Contraceptive management 07/13/2015  . Contraceptive management 07/13/2015   Menarche at 17 Regular and moderate flow No history of abnormal pap in past G2P2, s/p 2 SVD No history of abnormal MGM No concerns today No gyn surgeries LMP 12/15/2015, normal, 5 days   . H/O gestational diabetes mellitus, not currently pregnant   . Hyperglycemia 05/03/2017  . Hyperlipidemia   . Preventative health care 01/05/2016    No past surgical history on file.  Family History  Problem Relation Age of Onset  . Diabetes Father   . Hypertension Father   . Mental illness Mother        depression  . Hypertension Sister   . Gout Brother   . Cancer Maternal Aunt        lung    Social History   Socioeconomic History  . Marital status: Married    Spouse name: Not on file  . Number of children: Not on file  . Years of education: Not on file  . Highest education  level: Not on file  Occupational History  . Not on file  Social Needs  . Financial resource strain: Not on file  . Food insecurity:    Worry: Not on file    Inability: Not on file  . Transportation needs:    Medical: Not on file    Non-medical: Not on file  Tobacco Use  . Smoking status: Never Smoker  . Smokeless tobacco: Never Used  Substance and Sexual Activity  . Alcohol use: Yes    Alcohol/week: 0.0 standard drinks    Comment: occasion  . Drug use: No  . Sexual activity: Yes    Partners: Male    Birth control/protection: Pill    Comment: lives with husband and kids, works as Marine scientist, at [employer]  . Physical activity:    Days per week: Not on file    Minutes per session: Not on file  . Stress: Not on file  Relationships  . Social connections:    Talks on phone: Not on file    Gets together: Not on file    Attends religious service: Not on file    Active member of club or organization: Not on file    Attends meetings of clubs or organizations: Not on file    Relationship status: Not on file  . Intimate partner violence:    Fear of current  or ex partner: Not on file    Emotionally abused: Not on file    Physically abused: Not on file    Forced sexual activity: Not on file  Other Topics Concern  . Not on file  Social History Narrative  . Not on file    Outpatient Medications Prior to Visit  Medication Sig Dispense Refill  . acetaminophen (TYLENOL) 500 MG tablet Take 500 mg by mouth every 6 (six) hours as needed.    . Cholecalciferol (VITAMIN D) 2000 units CAPS Take 1 capsule (2,000 Units total) by mouth daily. 30 capsule 11  . nitroGLYCERIN (NITRODUR - DOSED IN MG/24 HR) 0.2 mg/hr patch Apply 1/4th patch to affected elbow, change daily 30 patch 1  . pantoprazole (PROTONIX) 40 MG tablet TAKE 1 TABLET (40 MG TOTAL) BY MOUTH DAILY. 90 tablet 1  . Vitamin D, Ergocalciferol, (DRISDOL) 50000 units CAPS capsule Take 1 capsule (50,000 Units  total) by mouth every 7 (seven) days. 4 capsule 4  . diclofenac (VOLTAREN) 75 MG EC tablet Take 1 tablet (75 mg total) by mouth 2 (two) times daily as needed. 60 tablet 5  . PIMTREA 0.15-0.02/0.01 MG (21/5) tablet TAKE 1 TABLET BY MOUTH DAILY. 84 tablet 3  . rosuvastatin (CRESTOR) 40 MG tablet TAKE 1 TABLET (40 MG TOTAL) BY MOUTH DAILY. 30 tablet 3   No facility-administered medications prior to visit.     Allergies  Allergen Reactions  . Retinoids Shortness Of Breath  . Sudafed [Pseudoephedrine Hcl] Palpitations    Review of Systems  Constitutional: Negative for chills, fever and malaise/fatigue.  HENT: Negative for congestion and hearing loss.   Eyes: Negative for discharge.  Respiratory: Negative for cough, sputum production and shortness of breath.   Cardiovascular: Negative for chest pain, palpitations and leg swelling.  Gastrointestinal: Positive for blood in stool. Negative for abdominal pain, constipation, diarrhea, heartburn, nausea and vomiting.  Genitourinary: Negative for dysuria, frequency, hematuria and urgency.  Musculoskeletal: Negative for back pain, falls, joint pain and myalgias.  Skin: Negative for rash.  Neurological: Negative for dizziness, sensory change, loss of consciousness, weakness and headaches.  Endo/Heme/Allergies: Negative for environmental allergies. Does not bruise/bleed easily.  Psychiatric/Behavioral: Negative for depression and suicidal ideas. The patient is not nervous/anxious and does not have insomnia.        Objective:    Physical Exam  Constitutional: She is oriented to person, place, and time. No distress.  HENT:  Head: Normocephalic and atraumatic.  Right Ear: External ear normal.  Left Ear: External ear normal.  Nose: Nose normal.  Mouth/Throat: Oropharynx is clear and moist. No oropharyngeal exudate.  Eyes: Pupils are equal, round, and reactive to light. Conjunctivae are normal. Right eye exhibits no discharge. Left eye exhibits no  discharge. No scleral icterus.  Neck: Normal range of motion. Neck supple. No thyromegaly present.  Cardiovascular: Normal rate, regular rhythm, normal heart sounds and intact distal pulses.  No murmur heard. Pulmonary/Chest: Effort normal and breath sounds normal. No respiratory distress. She has no wheezes. She has no rales.  Abdominal: Soft. Bowel sounds are normal. She exhibits no distension and no mass. There is no tenderness.  Musculoskeletal: Normal range of motion. She exhibits no edema or tenderness.  Lymphadenopathy:    She has no cervical adenopathy.  Neurological: She is alert and oriented to person, place, and time. She has normal reflexes. She displays normal reflexes. No cranial nerve deficit. Coordination normal.  Skin: Skin is warm and dry. No rash noted. She is  not diaphoretic.    There were no vitals taken for this visit. Wt Readings from Last 3 Encounters:  05/02/17 134 lb 9.6 oz (61.1 kg)  01/10/17 135 lb (61.2 kg)  12/30/16 135 lb 8 oz (61.5 kg)     Lab Results  Component Value Date   WBC 9.0 05/30/2018   HGB 13.7 05/30/2018   HCT 40.8 05/30/2018   PLT 303.0 05/30/2018   GLUCOSE 87 05/30/2018   CHOL 209 (H) 05/30/2018   TRIG 228.0 (H) 05/30/2018   HDL 62.70 05/30/2018   LDLDIRECT 112.0 05/30/2018   LDLCALC 73 08/10/2017   ALT 22 05/30/2018   AST 21 05/30/2018   NA 139 05/30/2018   K 3.6 05/30/2018   CL 102 05/30/2018   CREATININE 0.72 05/30/2018   BUN 14 05/30/2018   CO2 28 05/30/2018   TSH 1.61 05/30/2018   HGBA1C 6.1 05/30/2018    Lab Results  Component Value Date   TSH 1.61 05/30/2018   Lab Results  Component Value Date   WBC 9.0 05/30/2018   HGB 13.7 05/30/2018   HCT 40.8 05/30/2018   MCV 90.7 05/30/2018   PLT 303.0 05/30/2018   Lab Results  Component Value Date   NA 139 05/30/2018   K 3.6 05/30/2018   CO2 28 05/30/2018   GLUCOSE 87 05/30/2018   BUN 14 05/30/2018   CREATININE 0.72 05/30/2018   BILITOT 0.6 05/30/2018   ALKPHOS  60 05/30/2018   AST 21 05/30/2018   ALT 22 05/30/2018   PROT 7.3 05/30/2018   ALBUMIN 4.4 05/30/2018   CALCIUM 9.8 05/30/2018   GFR 92.55 05/30/2018   Lab Results  Component Value Date   CHOL 209 (H) 05/30/2018   Lab Results  Component Value Date   HDL 62.70 05/30/2018   Lab Results  Component Value Date   LDLCALC 73 08/10/2017   Lab Results  Component Value Date   TRIG 228.0 (H) 05/30/2018   Lab Results  Component Value Date   CHOLHDL 3 05/30/2018   Lab Results  Component Value Date   HGBA1C 6.1 05/30/2018       Assessment & Plan:   Problem List Items Addressed This Visit    Hyperlipidemia    Tolerating statin, encouraged heart healthy diet, avoid trans fats, minimize simple carbs and saturated fats. Increase exercise as tolerated      Relevant Medications   rosuvastatin (CRESTOR) 40 MG tablet   Other Relevant Orders   Lipid panel (Completed)   Vitamin D deficiency    Supplement and monitor      Relevant Orders   Vitamin D (25 hydroxy) (Completed)   Preventative health care    Patient encouraged to maintain heart healthy diet, regular exercise, adequate sleep. Consider daily probiotics. Take medications as prescribed. MGM ordered. Labs reviewed      Relevant Orders   TSH (Completed)   Hyperglycemia    hgba1c acceptable, minimize simple carbs. Increase exercise as tolerated.       Relevant Orders   Hemoglobin A1c (Completed)   Comprehensive metabolic panel (Completed)   TSH (Completed)   Rectal bleeding - Primary    Given rx for Anusol [facility] suppositories and referred to gastroenterology for further evaluation      Relevant Orders   Ambulatory referral to Gastroenterology   CBC (Completed)   Knee pain    Encouraged moist heat and gentle stretching as tolerated. May try NSAIDs and prescription meds as directed and report if symptoms worsen or seek immediate  care       Other Visit Diagnoses    Breast cancer screening       Relevant Orders    MM 3D SCREEN BREAST BILATERAL      I have discontinued Charnette Younkin. Raben's diclofenac and PIMTREA. I am also having her start on polyethylene glycol powder and BENEFIBER. Additionally, I am having her maintain her acetaminophen, Vitamin D (Ergocalciferol), Vitamin D, nitroGLYCERIN, pantoprazole, rosuvastatin, and desogestrel-ethinyl estradiol.  Meds ordered this encounter  Medications  . DISCONTD: rosuvastatin (CRESTOR) 40 MG tablet    Sig: Take 1 tablet (40 mg total) by mouth daily.    Dispense:  30 tablet    Refill:  3  . DISCONTD: desogestrel-ethinyl estradiol (PIMTREA) 0.15-0.02/0.01 MG (21/5) tablet    Sig: Take 1 tablet by mouth daily.    Dispense:  84 tablet    Refill:  3  . DISCONTD: hydrocortisone (ANUSOL-HC) 25 MG suppository    Sig: Place 1 suppository (25 mg total) rectally 2 (two) times daily.    Dispense:  12 suppository    Refill:  1  . rosuvastatin (CRESTOR) 40 MG tablet    Sig: Take 1 tablet (40 mg total) by mouth daily.    Dispense:  90 tablet    Refill:  3  . desogestrel-ethinyl estradiol (PIMTREA) 0.15-0.02/0.01 MG (21/5) tablet    Sig: Take 1 tablet by mouth daily.    Dispense:  84 tablet    Refill:  3  . polyethylene glycol powder (GLYCOLAX/MIRALAX) powder    Sig: Take 17 g by mouth daily as needed.    Dispense:  3350 g    Refill:  5  . Wheat Dextrin (BENEFIBER) POWD    Sig: Take 1 Dose by mouth daily as needed.    Dispense:  730 g    Refill:  5     Penni Homans, MD

## 2018-06-12 ENCOUNTER — Ambulatory Visit (HOSPITAL_BASED_OUTPATIENT_CLINIC_OR_DEPARTMENT_OTHER)
Admission: RE | Admit: 2018-06-12 | Discharge: 2018-06-12 | Disposition: A | Discharge status: Home / Self Care | Payer: 59 | Source: Ambulatory Visit | Attending: Family Medicine | Admitting: Family Medicine

## 2018-06-12 DIAGNOSIS — Z1239 Encounter for other screening for malignant neoplasm of breast: Secondary | ICD-10-CM

## 2018-06-12 DIAGNOSIS — Z1231 Encounter for screening mammogram for malignant neoplasm of breast: Secondary | ICD-10-CM | POA: Insufficient documentation

## 2018-06-12 MED FILL — HEMMOREX-HC 25 MG SUPP: 25 | 6 days supply | Qty: 12 | Fill #0

## 2018-06-27 MED FILL — PIMTREA 28 DAY TABLET: 0.15-0.02/0 | 84 days supply | Qty: 84 | Fill #1

## 2018-07-12 ENCOUNTER — Encounter: Payer: Self-pay | Admitting: Gastroenterology

## 2018-07-19 ENCOUNTER — Ambulatory Visit (INDEPENDENT_AMBULATORY_CARE_PROVIDER_SITE_OTHER): Payer: 59 | Admitting: Gastroenterology

## 2018-07-19 ENCOUNTER — Encounter: Payer: Self-pay | Admitting: Gastroenterology

## 2018-07-19 VITALS — BP 108/72 | HR 81 | Ht 60.5 in | Wt 136.5 lb

## 2018-07-19 DIAGNOSIS — K921 Melena: Secondary | ICD-10-CM | POA: Diagnosis not present

## 2018-07-19 DIAGNOSIS — K649 Unspecified hemorrhoids: Secondary | ICD-10-CM

## 2018-07-19 MED ORDER — SOD PICOSULFATE-MAG OX-CIT ACD 10-3.5-12 MG-GM -GM/160ML PO SOLN: 1.0000 | ORAL | 0 refills | Status: DC

## 2018-07-19 MED FILL — ROSUVASTATIN CALCIUM 40 MG: 40 | 90 days supply | Qty: 90 | Fill #0

## 2018-07-19 NOTE — Progress Notes (Signed)
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  She states she has been having issues with hemorrhoids since birth of 2nd child in 2008. Describes protruding hemorrhoids that are easily reducible, but does require intermittent manual reduction.  Occasional scant BRBPR on tissue paper occurring a few times per year.  She has trialed multiple modalities at treating the hemorrhoids to include sitz bath's, fiber supplementation, MiraLAX, but overall symptoms worsening. She was seen by her PCM, Dr. Charlett Blake, on 05/30/2018 and given prescription for An[facility] suppositories with only some improvement. No prior colonoscopy.  Otherwise, no associated changes in bowel habits, nausea, vomiting, fever, chills, weight loss.  Recent evaluation notable for normal CBC and CMP.  No recent abdominal imaging for review.  No known family history of CRC, GI malignancy, liver disease, pancreatic disease, or IBD.     Past Medical History:  Diagnosis Date  . Contraceptive management 07/13/2015  . Contraceptive management 07/13/2015  . Contraceptive management 07/13/2015   Menarche at 17 Regular and moderate flow No history of abnormal pap in past G2P2, s/p 2 SVD No history of abnormal MGM No concerns today No gyn surgeries LMP 12/15/2015, normal, 5 days   . H/O gestational diabetes mellitus, not currently pregnant   . Hyperglycemia 05/03/2017  . Hyperlipidemia   . Preventative health care 01/05/2016     No past surgical history on file. Family History  Problem Relation Age of Onset  . Diabetes Father   . Hypertension Father   . Mental illness Mother        depression  . Hypertension Sister   . Gout Brother   . Cancer Maternal Aunt        lung   Social History   Tobacco Use  . Smoking  status: Never Smoker  . Smokeless tobacco: Never Used  Substance Use Topics  . Alcohol use: Yes    Alcohol/week: 0.0 standard drinks    Comment: occasion  . Drug use: No   Current Outpatient Medications  Medication Sig Dispense Refill  . acetaminophen (TYLENOL) 500 MG tablet Take 500 mg by mouth every 6 (six) hours as needed.    . Cholecalciferol (VITAMIN D) 2000 units CAPS Take 1 capsule (2,000 Units total) by mouth daily. 30 capsule 11  . desogestrel-ethinyl estradiol (PIMTREA) 0.15-0.02/0.01 MG (21/5) tablet Take 1 tablet by mouth daily. 84 tablet 3  . hydrocortisone (ANUSOL-HC) 25 MG suppository Place 1 suppository (25 mg total) rectally 2 (two) times daily. 12 suppository 0  . nitroGLYCERIN (NITRODUR - DOSED IN MG/24 HR) 0.2 mg/hr patch Apply 1/4th patch to affected elbow, change daily 30 patch 1  . pantoprazole (PROTONIX) 40 MG tablet TAKE 1 TABLET (40 MG TOTAL) BY MOUTH DAILY. 90 tablet 1  . polyethylene glycol powder (GLYCOLAX/MIRALAX) powder Take 17 g by mouth daily as needed. 3350 g 5  . rosuvastatin (CRESTOR) 40 MG tablet Take 1 tablet (40 mg total) by mouth daily. 90 tablet 3  . Vitamin D, Ergocalciferol, (DRISDOL) 50000 units CAPS capsule Take 1 capsule (50,000 Units total) by mouth every 7 (seven) days. 4 capsule 4  . Wheat Dextrin (BENEFIBER) POWD Take 1 Dose by mouth daily as needed. 730 g  5   No current facility-administered medications for this visit.    Allergies  Allergen Reactions  . Retinoids Shortness Of Breath  . Sudafed [Pseudoephedrine Hcl] Palpitations     Review of Systems: All systems reviewed and negative except where noted in HPI.     Physical Exam:    Wt Readings from Last 3 Encounters:  05/02/17 134 lb 9.6 oz (61.1 kg)  01/10/17 135 lb (61.2 kg)  12/30/16 135 lb 8 oz (61.5 kg)    There were no vitals taken for this visit. Constitutional:  Pleasant, in no acute distress. Psychiatric: Normal mood and affect. Behavior is normal. EENT: Pupils  normal.  Conjunctivae are normal. No scleral icterus. Neck supple. No cervical LAD. Cardiovascular: Normal rate, regular rhythm. No edema Pulmonary/chest: Effort normal and breath sounds normal. No wheezing, rales or rhonchi. Abdominal: Soft, nondistended, nontender. Bowel sounds active throughout. There are no masses palpable. No hepatomegaly. Neurological: Alert and oriented to person place and time. Skin: Skin is warm and dry. No rashes noted. Rectal: Exam deferred by patient to time of colonoscopy.    ASSESSMENT AND PLAN;   Vickie Thompson is a 47 y.o. female presenting with symptomatic hemorrhoids which are worsening despite appropriate initial steps of intervention.  We will plan on evaluation treatment as below:  1) hemorrhoids: - Colonoscopy now to evaluate for additional intraluminal/mucosal etiology as well as to establish grade of t severity of hemorrhoids. - Continue appropriate toilet hygiene, avoid straining, fiber intake, adequate fluid intake all with goal of regular soft stools without straining to have a bowel movement. - Pending results of colonoscopy, will set up for hemorrhoid band ligation in the clinic.  2) hematochezia: Intermittent, scant, non-hemodynamically significant hematochezia most likely attributable to anorectal disease (i.e. hemorrhoids).  Will evaluate for alternate more proximal etiology with colonoscopy as above.  The indications, risks, and benefits of colonoscopy were explained to the patient in detail. Risks include but are not limited to bleeding, perforation, adverse reaction to medications, and cardiopulmonary compromise. Sequelae include but are not limited to the possibility of surgery, hospitalization, and mortality. The patient verbalized understanding and wished to proceed. All questions answered, referred to the scheduler and bowel prep ordered. Further recommendations pending results of the exam.    Lavena Bullion, DO, FACG  07/19/2018,  3:23 PM   Mosie Lukes, MD

## 2018-07-19 NOTE — Patient Instructions (Addendum)
If you are age 46 or older, your body mass index should be between 23-30. Your Body mass index is 26.22 kg/m. If this is out of the aforementioned range listed, please consider follow up with your Primary Care Provider.  If you are age 44 or younger, your body mass index should be between 19-25. Your Body mass index is 26.22 kg/m. If this is out of the aformentioned range listed, please consider follow up with your Primary Care Provider.   You have been scheduled for a colonoscopy. Please follow written instructions given to you at your visit today.  Please pick up your prep supplies at the pharmacy within the next 1-3 days. If you use inhalers (even only as needed), please bring them with you on the day of your procedure. Your physician has requested that you go to www.startemmi.com and enter the access code given to you at your visit today. This web site gives a general overview about your procedure. However, you should still follow specific instructions given to you by our office regarding your preparation for the procedure.   We have sent the following medications to your pharmacy for you to pick up at your convenience: Clenpiq It was a pleasure to see you today!  Vito Cirigliano, D.O.

## 2018-07-20 ENCOUNTER — Telehealth: Payer: Self-pay | Admitting: Gastroenterology

## 2018-07-20 ENCOUNTER — Encounter: Payer: Self-pay | Admitting: Gastroenterology

## 2018-07-20 NOTE — Telephone Encounter (Signed)
Pt calling to inform that her pharmacy does not carry clenpiq and wants to know if another prep can be prescribed,.

## 2018-07-21 NOTE — Telephone Encounter (Signed)
Pt returned your call, she will pick up sample on Tuesday.

## 2018-07-21 NOTE — Telephone Encounter (Signed)
Left message for patient to come by the office to pick up a sample of the prep.

## 2018-07-28 ENCOUNTER — Ambulatory Visit (AMBULATORY_SURGERY_CENTER): Payer: 59 | Admitting: Gastroenterology

## 2018-07-28 ENCOUNTER — Encounter: Payer: Self-pay | Admitting: Gastroenterology

## 2018-07-28 VITALS — BP 122/68 | HR 59 | Temp 99.3°F | Resp 11 | Ht 60.5 in | Wt 136.0 lb

## 2018-07-28 DIAGNOSIS — D120 Benign neoplasm of cecum: Secondary | ICD-10-CM

## 2018-07-28 DIAGNOSIS — D128 Benign neoplasm of rectum: Secondary | ICD-10-CM

## 2018-07-28 DIAGNOSIS — K641 Second degree hemorrhoids: Secondary | ICD-10-CM | POA: Diagnosis not present

## 2018-07-28 DIAGNOSIS — K921 Melena: Secondary | ICD-10-CM | POA: Diagnosis not present

## 2018-07-28 DIAGNOSIS — K219 Gastro-esophageal reflux disease without esophagitis: Secondary | ICD-10-CM | POA: Diagnosis not present

## 2018-07-28 MED ORDER — SODIUM CHLORIDE 0.9 % IV SOLN: 500.0000 mL | Freq: Once | INTRAVENOUS | Status: DC

## 2018-07-28 NOTE — Progress Notes (Signed)
Report to PACU, RN, vss, BBS= Clear.  

## 2018-07-28 NOTE — Patient Instructions (Signed)
   Information on polyps, hemorrhoids and high fiber diet given to you today   High fiber diet or use fiber supplement such as Metamucil or Benefiber or Citracel- information on this given to you today   Information on hemorrhoidal banding given to you today   YOU HAD AN ENDOSCOPIC PROCEDURE TODAY AT [facility]:   Refer to the procedure report that was given to you for any specific questions about what was found during the examination.  If the procedure report does not answer your questions, please call your gastroenterologist to clarify.  If you requested that your care partner not be given the details of your procedure findings, then the procedure report has been included in a sealed envelope for you to review at your convenience later.  YOU SHOULD EXPECT: Some feelings of bloating in the abdomen. Passage of more gas than usual.  Walking can help get rid of the air that was put into your GI tract during the procedure and reduce the bloating. If you had a lower endoscopy (such as a colonoscopy or flexible sigmoidoscopy) you may notice spotting of blood in your stool or on the toilet paper. If you underwent a bowel prep for your procedure, you may not have a normal bowel movement for a few days.  Please Note:  You might notice some irritation and congestion in your nose or some drainage.  This is from the oxygen used during your procedure.  There is no need for concern and it should clear up in a day or so.  SYMPTOMS TO REPORT IMMEDIATELY:   Following lower endoscopy (colonoscopy or flexible sigmoidoscopy):  Excessive amounts of blood in the stool  Significant tenderness or worsening of abdominal pains  Swelling of the abdomen that is new, acute  Fever of 100F or higher    For urgent or emergent issues, a gastroenterologist can be reached at any hour by calling [phone].   DIET:  We do recommend a small meal at first, but then you may proceed to your regular  diet.  Drink plenty of fluids but you should avoid alcoholic beverages for 24 hours.  ACTIVITY:  You should plan to take it easy for the rest of today and you should NOT DRIVE or use heavy machinery until tomorrow (because of the sedation medicines used during the test).    FOLLOW UP: Our staff will call the number listed on your records the next business day following your procedure to check on you and address any questions or concerns that you may have regarding the information given to you following your procedure. If we do not reach you, we will leave a message.  However, if you are feeling well and you are not experiencing any problems, there is no need to return our call.  We will assume that you have returned to your regular daily activities without incident.  If any biopsies were taken you will be contacted by phone or by letter within the next 1-3 weeks.  Please call us at [phone] if you have not heard about the biopsies in 3 weeks.    SIGNATURES/CONFIDENTIALITY: You and/or your care partner have signed paperwork which will be entered into your electronic medical record.  These signatures attest to the fact that that the information above on your After Visit Summary has been reviewed and is understood.  Full responsibility of the confidentiality of this discharge information lies with you and/or your care-partner.

## 2018-07-28 NOTE — Op Note (Signed)
[place] Patient Name: Vickie Thompson Procedure Date: 07/28/2018 9:41 AM MRN: [id number] Endoscopist: Gerrit Heck , MD Age: 47 Referring MD:  Date of Birth: 07/10/1971 Gender: Female Account #: [id number] Procedure:                Colonoscopy Indications:              Hematochezia, Hemorrhoids Medicines:                Monitored Anesthesia Care Procedure:                Pre-Anesthesia Assessment:                           - Prior to the procedure, a History and Physical                            was performed, and patient medications and                            allergies were reviewed. The patient's tolerance of                            previous anesthesia was also reviewed. The risks                            and benefits of the procedure and the sedation                            options and risks were discussed with the patient.                            All questions were answered, and informed consent                            was obtained. Prior Anticoagulants: The patient has                            taken no previous anticoagulant or antiplatelet                            agents. ASA Grade Assessment: II - A patient with                            mild systemic disease. After reviewing the risks                            and benefits, the patient was deemed in                            satisfactory condition to undergo the procedure.                           After obtaining informed consent, the colonoscope  was passed under direct vision. Throughout the                            procedure, the patient's blood pressure, pulse, and                            oxygen saturations were monitored continuously. The                            Model CF-HQ190L [id number]) scope was introduced                            through the anus and advanced to the the terminal                            ileum. The colonoscopy was  performed without                            difficulty. The patient tolerated the procedure                            well. The quality of the bowel preparation was                            adequate. Scope In: 9:53:44 AM Scope Out: 10:08:52 AM Scope Withdrawal Time: 0 hours 12 minutes 19 seconds  Total Procedure Duration: 0 hours 15 minutes 8 seconds  Findings:                 The perianal and digital rectal examinations were                            normal.                           A 3 mm polyp was found in the cecum. The polyp was                            sessile. The polyp was removed with a cold snare.                            Resection and retrieval were complete. Estimated                            blood loss was minimal.                           A 1 mm polyp was found in the rectum. The polyp was                            sessile. The polyp was removed with a cold snare.                            Resection was complete, but the polyp tissue was  not retrieved (disintegrated in the colonoscope                            upon retrieval). Estimated blood loss was minimal.                           Non-bleeding internal hemorrhoids were found during                            retroflexion. The hemorrhoids were medium-sized.                           The exam was otherwise normal throughout the                            remainder of the colon.                           The terminal ileum appeared normal. Complications:            No immediate complications. Estimated Blood Loss:     Estimated blood loss was minimal. Impression:               - One 3 mm polyp in the cecum, removed with a cold                            snare. Resected and retrieved.                           - One 1 mm polyp in the rectum, removed with a cold                            snare. Complete resection. Polyp tissue not                            retrieved.                            - Non-bleeding internal hemorrhoids.                           - The examined portion of the ileum was normal. Recommendation:           - Patient has a contact number available for                            emergencies. The signs and symptoms of potential                            delayed complications were discussed with the                            patient. Return to normal activities tomorrow.                            Written discharge instructions were provided  to the                            patient.                           - Resume previous diet today.                           - Continue present medications.                           - Await pathology results.                           - Repeat colonoscopy in 5-10 years for surveillance                            based on pathology results.                           - Use fiber, for example Citrucel, Fibercon, Konsyl                            or Metamucil.                           - Internal hemorrhoids were noted on this study and                            may be amenable to hemorrhoid band ligation. If you                            are interested in further treatment of these                            hemorrhoids with band ligation, please contact my                            clinic to set up an appointment for evaluation and                            treatment. Gerrit Heck, MD 07/28/2018 10:14:28 AM

## 2018-07-28 NOTE — Progress Notes (Signed)
Called to room to assist during endoscopic procedure.  Patient ID and intended procedure confirmed with present staff. Received instructions for my participation in the procedure from the performing physician.  

## 2018-07-31 ENCOUNTER — Telehealth: Payer: Self-pay

## 2018-07-31 NOTE — Telephone Encounter (Signed)
  Follow up Call-  Call back number 07/28/2018  Post procedure Call Back phone  # [phone]  Permission to leave phone message Yes  Some recent data might be hidden     Patient questions:  Do you have a fever, pain , or abdominal swelling? No. Pain Score  0 *  Have you tolerated food without any problems? Yes.    Have you been able to return to your normal activities? Yes.    Do you have any questions about your discharge instructions: Diet   No. Medications  No. Follow up visit  No.  Do you have questions or concerns about your Care? No.  Actions: * If pain score is 4 or above: No action needed, pain <4.

## 2018-08-02 ENCOUNTER — Encounter: Payer: Self-pay | Admitting: Gastroenterology

## 2018-08-07 MED FILL — HEMMOREX-HC 25 MG SUPP: 25 | 6 days supply | Qty: 12 | Fill #1

## 2018-08-25 ENCOUNTER — Ambulatory Visit: Payer: 59 | Admitting: Gastroenterology

## 2018-08-25 ENCOUNTER — Encounter: Payer: Self-pay | Admitting: Gastroenterology

## 2018-08-25 VITALS — BP 136/84 | HR 85 | Ht 60.5 in | Wt 137.5 lb

## 2018-08-25 DIAGNOSIS — K649 Unspecified hemorrhoids: Secondary | ICD-10-CM

## 2018-08-25 NOTE — Progress Notes (Signed)
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  Hemorrhoids present since birth of second child in 2008, not responsive to maximal medical management.  Colonoscopy on 07/28/2018 notable for small sessile serrated polyp in the cecum and a benign rectal polyp along with medium size internal hemorrhoids.  HPI:    47 year old female with a history of symptomatic hemorrhoids presents today for hemorrhoid band ligation. The patient presents with symptomatic grade 2  hemorrhoids, unresponsive to maximal medical therapy, requesting rubber band ligation of hemorrhoidal disease.  All risks, benefits and alternative forms of therapy were described and informed consent was obtained.  No change in medications or overall health since last appointment.  Endoscopic history: -Colonoscopy (07/28/2018, Dr. Bryan Lemma): Small sessile serrated polyp in the cecum, benign rectal polyp, medium-size internal hemorrhoids  Review of systems:     No chest pain, no SOB, no fevers, no urinary sx   Past Medical History:  Diagnosis Date  . Contraceptive management 07/13/2015  . Contraceptive management 07/13/2015  . Contraceptive management 07/13/2015   Menarche at 17 Regular and moderate flow No history of abnormal pap in past G2P2, s/p 2 SVD No history of abnormal MGM No concerns today No gyn surgeries LMP 12/15/2015, normal, 5 days   . GERD (gastroesophageal reflux disease)   . H/O gestational diabetes mellitus, not currently pregnant   . Hemorrhoid   . Hyperglycemia 05/03/2017  . Hyperlipidemia   . Preventative health care 01/05/2016    Patient's surgical history, family medical history, social history, medications and allergies were all reviewed in Epic    Current Outpatient Medications  Medication Sig Dispense Refill  . Cholecalciferol (VITAMIN D) 2000 units CAPS  Take 1 capsule (2,000 Units total) by mouth daily. 30 capsule 11  . desogestrel-ethinyl estradiol (PIMTREA) 0.15-0.02/0.01 MG (21/5) tablet Take 1 tablet by mouth daily. 84 tablet 3  . hydrocortisone (ANUSOL-HC) 25 MG suppository Place 1 suppository (25 mg total) rectally 2 (two) times daily. 12 suppository 0  . pantoprazole (PROTONIX) 40 MG tablet TAKE 1 TABLET (40 MG TOTAL) BY MOUTH DAILY. (Patient not taking: Reported on 07/28/2018) 90 tablet 1  . polyethylene glycol powder (GLYCOLAX/MIRALAX) powder Take 17 g by mouth daily as needed. 3350 g 5  . rosuvastatin (CRESTOR) 40 MG tablet Take 1 tablet (40 mg total) by mouth daily. 90 tablet 3  . Wheat Dextrin (BENEFIBER) POWD Take 1 Dose by mouth daily as needed. (Patient not taking: Reported on 07/19/2018) 730 g 5   No current facility-administered medications for this visit.     Physical Exam:     Ht 5' 0.5" (1.537 m)   Wt 137 lb 8 oz (62.4 kg)   BMI 26.41 kg/m   GENERAL:  Pleasant female in NAD PSYCH: : Cooperative, normal affect EENT:  conjunctiva pink, mucous membranes moist, neck supple without masses CARDIAC:  RRR, no murmur heard, no peripheral edema PULM: Normal respiratory effort, lungs CTA bilaterally, no wheezing ABDOMEN:  Nondistended, soft, nontender. No obvious masses, no hepatomegaly,  normal bowel sounds SKIN:  turgor, no lesions seen Musculoskeletal:  Normal muscle tone, normal strength NEURO: Alert and oriented x 3, no focal neurologic deficits Rectal exam: Sensation intact and preserved anal wink. No external anal fissures, external hemorrhoids or skin tags. Normal sphincter tone. No palpable mass. No blood on the exam glove. Anoscopy notable for Grade 2 hemorrhoids  in all positions, with RA most pronounced. (Chaperone: Herma Mering, CMA).     IMPRESSION and PLAN:    #1.  Internal hemorrhoids:  PROCEDURE NOTE: The patient presents with symptomatic grade 2  hemorrhoids, unresponsive to maximal medical therapy,  requesting rubber band ligation of symptomatic hemorrhoidal disease.  All risks, benefits and alternative forms of therapy were described and informed consent was obtained.  In the Left Lateral Decubitus position, anoscopic examination revealed grade 2 hemorrhoids in the all positions.  The anorectum was pre-medicated with RectiCare The decision was made to band the RA internal hemorrhoid, and the Armstrong was used to perform band ligation without complication.  Digital anorectal examination was then performed to assure proper positioning of the band, and to adjust the banded tissue as required.  The patient was discharged home without pain or other issues.  Dietary and behavioral recommendations were given and along with follow-up instructions.     The following adjunctive treatments were recommended:  Resume high fiber diet and add supplemental fiber as needed for goal of soft stools withotu straining to have BM  The patient will return in 2-4 weeks for  follow-up and possible additional banding as required. No complications were encountered and the patient tolerated the procedure well.   #2.  Sessile serrated polyp: - Repeat colonoscopy in 5 years, 2024, for ongoing surveillance      Vickie Thompson ,DO, FACG 08/25/2018, 3:54 PM

## 2018-08-25 NOTE — Patient Instructions (Addendum)
If you are age 46 or older, your body mass index should be between 23-30. Your Body mass index is 26.41 kg/m. If this is out of the aforementioned range listed, please consider follow up with your Primary Care Provider.  If you are age 48 or younger, your body mass index should be between 19-25. Your Body mass index is 26.41 kg/m. If this is out of the aformentioned range listed, please consider follow up with your Primary Care Provider.   HEMORRHOID BANDING PROCEDURE    FOLLOW-UP CARE   1. The procedure you have had should have been relatively painless since the banding of the area involved does not have nerve endings and there is no pain sensation.  The rubber band cuts off the blood supply to the hemorrhoid and the band may fall off as soon as 48 hours after the banding (the band may occasionally be seen in the toilet bowl following a bowel movement). You may notice a temporary feeling of fullness in the rectum which should respond adequately to plain Tylenol or Motrin.  2. Following the banding, avoid strenuous exercise that evening and resume full activity the next day.  A sitz bath (soaking in a warm tub) or bidet is soothing, and can be useful for cleansing the area after bowel movements.     3. To avoid constipation, take two tablespoons of natural wheat bran, natural oat bran, flax, Benefiber or any over the counter fiber supplement and increase your water intake to 7-8 glasses daily.    4. Unless you have been prescribed anorectal medication, do not put anything inside your rectum for two weeks: No suppositories, enemas, fingers, etc.  5. Occasionally, you may have more bleeding than usual after the banding procedure.  This is often from the untreated hemorrhoids rather than the treated one.  Don't be concerned if there is a tablespoon or so of blood.  If there is more blood than this, lie flat with your bottom higher than your head and apply an ice pack to the area. If the bleeding  does not stop within a half an hour or if you feel faint, call our office at [phone] or go to the emergency room.  6. Problems are not common; however, if there is a substantial amount of bleeding, severe pain, chills, fever or difficulty passing urine (very rare) or other problems, you should call us at [phone] or report to the nearest emergency room.  7. Do not stay seated continuously for more than 2-3 hours for a day or two after the procedure.  Tighten your buttock muscles 10-15 times every two hours and take 10-15 deep breaths every 1-2 hours.  Do not spend more than a few minutes on the toilet if you cannot empty your bowel; instead re-visit the toilet at a later time.    It was a pleasure to see you today!  Vito Cirigliano, D.O.

## 2018-09-13 ENCOUNTER — Encounter: Payer: 59 | Admitting: Gastroenterology

## 2018-09-14 MED FILL — HEMMOREX-HC 25 MG SUPP: 25 | 6 days supply | Qty: 12 | Fill #0

## 2018-09-18 MED FILL — PIMTREA 28 DAY TABLET: 0.15-0.02/0 | 84 days supply | Qty: 84 | Fill #2

## 2018-09-19 ENCOUNTER — Encounter: Payer: Self-pay | Admitting: Gastroenterology

## 2018-09-19 ENCOUNTER — Ambulatory Visit: Payer: 59 | Admitting: Gastroenterology

## 2018-09-19 VITALS — BP 102/68 | HR 68 | Ht 60.5 in | Wt 139.0 lb

## 2018-09-19 DIAGNOSIS — K648 Other hemorrhoids: Secondary | ICD-10-CM

## 2018-09-19 NOTE — Progress Notes (Signed)
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  Had been having issues with hemorrhoids since birth of second child in 2008, described as protruding hemorrhoids that are easily reducible, but requiring intermittent manual reduction.  Index symptoms of scant BRBPR on tissue paper without long-lasting improvement despite multiple modalities of treatment to include sitz bath, fiber supplement, MiraLAX, Anusol HC suppositories.  Colonoscopy completed on 07/28/2018 and notable for medium size internal hemorrhoids along with a 3 mm sessile serrated polyp in the cecum and a 1 mm benign rectal polyp.  Remainder of the colon was normal along with normal terminal ileum. Initial hemorrhoid banding completed on 96/2/22 without complication with successful banding of the RA hemorrhoid.   HPI:     Patient is a 47 y.o. femalewith a history of symptomatic internal hemorrhoids presenting [facility] for follow-up and ongoing treatment. The patient presents with symptomatic grade 2 hemorrhoids, unresponsive to maximal medical therapy, requesting ongoing rubber band ligation of symptomatic hemorrhoidal disease.  Initial hemorrhoid banding completed on 08/25/2018 with successful banding of the RA hemorrhoid.  No change in medical or surgical history, medications, allergies, social history since last appointment with me.  Endoscopic history: -Colonoscopy (07/28/2018, Dr. Bryan Lemma): 3 mm sessile serrated polyp in the cecum, 1 mm polyp in the rectum, medium-size internal hemorrhoids, normal terminal ileum.  Recommended repeat in 5 years surveillance.   Review of systems:     No chest pain, no SOB, no fevers, no urinary sx   Past Medical History:  Diagnosis Date  . Contraceptive management 07/13/2015    . Contraceptive management 07/13/2015  . Contraceptive management 07/13/2015   Menarche at 17 Regular and moderate flow No history of abnormal pap in past G2P2, s/p 2 SVD No history of abnormal MGM No concerns today No gyn surgeries LMP 12/15/2015, normal, 5 days   . GERD (gastroesophageal reflux disease)   . H/O gestational diabetes mellitus, not currently pregnant   . Hemorrhoid   . Hyperglycemia 05/03/2017  . Hyperlipidemia   . Preventative health care 01/05/2016    Patient's surgical history, family medical history, social history, medications and allergies were all reviewed in Epic    Current Outpatient Medications  Medication Sig Dispense Refill  . Cholecalciferol (VITAMIN D) 2000 units CAPS Take 1 capsule (2,000 Units total) by mouth daily. 30 capsule 11  . desogestrel-ethinyl estradiol (PIMTREA) 0.15-0.02/0.01 MG (21/5) tablet Take 1 tablet by mouth daily. 84 tablet 3  . hydrocortisone (ANUSOL-HC) 25 MG suppository Place 1 suppository (25 mg total) rectally 2 (two) times daily. 12 suppository 0  . pantoprazole (PROTONIX) 40 MG tablet TAKE 1 TABLET (40 MG TOTAL) BY MOUTH DAILY. (Patient not taking: Reported on 07/28/2018) 90 tablet 1  . polyethylene glycol powder (GLYCOLAX/MIRALAX) powder Take 17 g by mouth daily as needed. 3350 g 5  . rosuvastatin (CRESTOR) 40 MG tablet Take 1 tablet (40 mg total) by mouth daily. 90 tablet 3  . Wheat Dextrin (BENEFIBER) POWD Take 1 Dose by mouth daily as needed. (Patient not taking: Reported on 07/19/2018) 730 g 5   No current facility-administered medications for this visit.     Physical Exam:     There were no vitals taken for this visit.  GENERAL:  Pleasant female in NAD PSYCH: : Cooperative, normal affect ABDOMEN:  Nondistended, soft, nontender.  No obvious masses, no hepatomegaly,  normal bowel sounds Rectal exam: Sensation intact and preserved anal wink. No external anal fissures, external hemorrhoids or skin tags. Normal sphincter tone. No  palpable mass. No blood on the exam glove. Anoscopy notable for Grade 2 hemorrhoids LL and RP positions. (Chaperone: Herma Mering, CMA).    IMPRESSION and PLAN:    #1.  Symptomatic internal hemorrhoids: PROCEDURE NOTE: The patient presents with symptomatic grade 2  hemorrhoids, unresponsive to maximal medical therapy, requesting rubber band ligation of symptomatic hemorrhoidal disease.  All risks, benefits and alternative forms of therapy were described and informed consent was obtained.  In the Left Lateral Decubitus position, anoscopic examination revealed grade 2 hemorrhoids in the LL and RP position(s).  The anorectum was pre-medicated with RectiCare. The decision was made to band the LL internal hemorrhoid, and the [place] was used to perform band ligation without complication.  Digital anorectal examination was then performed to assure proper positioning of the band, and to adjust the banded tissue as required.  The patient was discharged home without pain or other issues.  Dietary and behavioral recommendations were given and along with follow-up instructions.     The following adjunctive treatments were recommended:  -Resume high-fiber diet with fiber supplement (i.e. Citrucel or Benefiber) with goal for soft stools without straining to have a BM. -Resume adequate fluid intake.  The patient will return in 2-4 for  follow-up and possible additional banding as required. No complications were encountered and the patient tolerated the procedure well.      #2.  Sessile serrated polyp: -Repeat colonoscopy in 5 years, 2024, for ongoing surveillance.      Lavena Bullion ,DO, FACG 09/19/2018, 10:56 AM

## 2018-09-19 NOTE — Patient Instructions (Signed)
If you are age 46 or older, your body mass index should be between 23-30. Your Body mass index is 26.7 kg/m. If this is out of the aforementioned range listed, please consider follow up with your Primary Care Provider.  If you are age 60 or younger, your body mass index should be between 19-25. Your Body mass index is 26.7 kg/m. If this is out of the aformentioned range listed, please consider follow up with your Primary Care Provider.   HEMORRHOID BANDING PROCEDURE    FOLLOW-UP CARE   1. The procedure you have had should have been relatively painless since the banding of the area involved does not have nerve endings and there is no pain sensation.  The rubber band cuts off the blood supply to the hemorrhoid and the band may fall off as soon as 48 hours after the banding (the band may occasionally be seen in the toilet bowl following a bowel movement). You may notice a temporary feeling of fullness in the rectum which should respond adequately to plain Tylenol or Motrin.  2. Following the banding, avoid strenuous exercise that evening and resume full activity the next day.  A sitz bath (soaking in a warm tub) or bidet is soothing, and can be useful for cleansing the area after bowel movements.     3. To avoid constipation, take two tablespoons of natural wheat bran, natural oat bran, flax, Benefiber or any over the counter fiber supplement and increase your water intake to 7-8 glasses daily.    4. Unless you have been prescribed anorectal medication, do not put anything inside your rectum for two weeks: No suppositories, enemas, fingers, etc.  5. Occasionally, you may have more bleeding than usual after the banding procedure.  This is often from the untreated hemorrhoids rather than the treated one.  Don't be concerned if there is a tablespoon or so of blood.  If there is more blood than this, lie flat with your bottom higher than your head and apply an ice pack to the area. If the bleeding  does not stop within a half an hour or if you feel faint, call our office at [phone] or go to the emergency room.  6. Problems are not common; however, if there is a substantial amount of bleeding, severe pain, chills, fever or difficulty passing urine (very rare) or other problems, you should call us at [phone] or report to the nearest emergency room.  7. Do not stay seated continuously for more than 2-3 hours for a day or two after the procedure.  Tighten your buttock muscles 10-15 times every two hours and take 10-15 deep breaths every 1-2 hours.  Do not spend more than a few minutes on the toilet if you cannot empty your bowel; instead re-visit the toilet at a later time.    It was a pleasure to see you today!  Vito Cirigliano, D.O.

## 2018-10-02 ENCOUNTER — Encounter: Payer: 59 | Admitting: Gastroenterology

## 2018-10-04 ENCOUNTER — Ambulatory Visit: Payer: 59 | Admitting: Gastroenterology

## 2018-10-04 ENCOUNTER — Encounter: Payer: Self-pay | Admitting: Gastroenterology

## 2018-10-04 ENCOUNTER — Encounter: Payer: 59 | Admitting: Gastroenterology

## 2018-10-04 VITALS — BP 124/80 | HR 85 | Ht 60.5 in | Wt 137.2 lb

## 2018-10-04 DIAGNOSIS — D126 Benign neoplasm of colon, unspecified: Secondary | ICD-10-CM

## 2018-10-04 DIAGNOSIS — K640 First degree hemorrhoids: Secondary | ICD-10-CM

## 2018-10-04 NOTE — Progress Notes (Signed)
P  Chief Complaint:    Symptomatic Internal Hemorrhoids; Hemorrhoid Band Ligation  GI History: 47 year old female, IV nurse at [employer], initially referred to me for evaluation of hematochezia and symptomatic hemorrhoids on 07/19/2018.  Had been having issues with hemorrhoids since birth of second child in 2008, described as protruding hemorrhoids that are easily reducible, but requiring intermittent manual reduction.  Index symptoms of scant BRBPR on tissue paper without long-lasting improvement despite multiple modalities of treatment to include sitz bath, fiber supplement, MiraLAX, Anusol HC suppositories.  Colonoscopy completed on 07/28/2018 and notable for medium size internal hemorrhoids along with a 3 mm sessile serrated polyp in the cecum and a 1 mm benign rectal polyp.  Remainder of the colon was normal along with normal terminal ileum. Initial hemorrhoid banding completed on 60/7/37 without complication with successful banding of the RA hemorrhoid.   Banded LL hemorrhoid on 10/62/6948 without complication.  She has had overall significant improvement in index symptoms since starting hemorrhoid banding.  HPI:     Patient is a 47 y.o. femalewith a history of symptomatic internal hemorrhoids presenting to the [facility] for follow-up and ongoing treatment. The patient presents with symptomatic grade 2 hemorrhoids, unresponsive to maximal medical therapy, requesting rubber band ligation of symptomatic hemorrhoidal disease.  - 08/25/18: Banded RA hemorrhoid - 09/19/18: Banded LL hemorrhoid - 10/04/18: Plan to band RP hemorrhoid today.     Today, she reports overall significant improvement with only rare scant bright red blood on tissue paper.  No hemorrhoid prolapse since starting banding.  No change in medical or surgical history, medications, allergies, social history since last appointment with me.  Endoscopic history: -Colonoscopy (07/28/2018, Dr. Bryan Lemma): 3  mm sessile serrated polyp in the cecum, 1 mm polyp in the rectum, medium-size internal hemorrhoids, normal terminal ileum.  Recommended repeat in 5 years surveillance.  Review of systems:     No chest pain, no SOB, no fevers, no urinary sx   Past Medical History:  Diagnosis Date  . Contraceptive management 07/13/2015  . Contraceptive management 07/13/2015  . Contraceptive management 07/13/2015   Menarche at 17 Regular and moderate flow No history of abnormal pap in past G2P2, s/p 2 SVD No history of abnormal MGM No concerns today No gyn surgeries LMP 12/15/2015, normal, 5 days   . GERD (gastroesophageal reflux disease)   . H/O gestational diabetes mellitus, not currently pregnant   . Hemorrhoid   . Hyperglycemia 05/03/2017  . Hyperlipidemia   . Preventative health care 01/05/2016    Patient's surgical history, family medical history, social history, medications and allergies were all reviewed in Epic    Current Outpatient Medications  Medication Sig Dispense Refill  . Cholecalciferol (VITAMIN D) 2000 units CAPS Take 1 capsule (2,000 Units total) by mouth daily. 30 capsule 11  . desogestrel-ethinyl estradiol (PIMTREA) 0.15-0.02/0.01 MG (21/5) tablet Take 1 tablet by mouth daily. 84 tablet 3  . hydrocortisone (ANUSOL-HC) 25 MG suppository Place 1 suppository (25 mg total) rectally 2 (two) times daily. 12 suppository 0  . pantoprazole (PROTONIX) 40 MG tablet TAKE 1 TABLET (40 MG TOTAL) BY MOUTH DAILY. 90 tablet 1  . polyethylene glycol powder (GLYCOLAX/MIRALAX) powder Take 17 g by mouth daily as needed. 3350 g 5  . rosuvastatin (CRESTOR) 40 MG tablet Take 1 tablet (40 mg total) by mouth daily. 90 tablet 3  . Wheat Dextrin (BENEFIBER) POWD Take 1 Dose by mouth daily as needed. 730 g 5   No current facility-administered  medications for this visit.     Physical Exam:     BP 124/80   Pulse 85   Ht 5' 0.5" (1.537 m)   Wt 137 lb 4 oz (62.3 kg)   BMI 26.36 kg/m   GENERAL:  Pleasant female  in NAD PSYCH: : Cooperative, normal affect EENT:  conjunctiva pink, mucous membranes moist, neck supple without masses ABDOMEN:  Nondistended, soft, nontender.  NEURO: Alert and oriented x 3, no focal neurologic deficits Rectal exam: Sensation intact and preserved anal wink.  No external hemorrhoids or anal fissures noted. Normal sphincter tone. No palpable mass. No blood on the exam glove. (Chaperone: London Pepper, CMA).   IMPRESSION and PLAN:    #1.  Symptomatic internal hemorrhoids: PROCEDURE NOTE: The patient presents with symptomatic grade 2 hemorrhoids, unresponsive to maximal medical therapy, requesting rubber band ligation of symptomatic hemorrhoidal disease.  All risks, benefits and alternative forms of therapy were described and informed consent was obtained.  In the Left Lateral Decubitus position, anoscopic examination revealed grade 1 hemorrhoids in the RP position.  The anorectum was pre-medicated with RectiCare. The decision was made to band the RP internal hemorrhoid, and the [place] was used to perform band ligation without complication.  Digital anorectal examination was then performed to assure proper positioning of the band, and to adjust the banded tissue as required.  The patient was discharged home without pain or other issues.  Dietary and behavioral recommendations were given and along with follow-up instructions.     The following adjunctive treatments were recommended:  -Resume high-fiber diet with fiber supplement (i.e. Citrucel or Benefiber) with goal for soft stools without straining to have a BM. -Resume adequate fluid intake.  Follow-up as needed in the GI clinic.  If recurrence of index symptoms, can consider repeat banding as indicated. No complications were encountered and the patient tolerated the procedure well.   #2.  Sessile serrated polyp: -Repeat colonoscopy in 5 years, 2024, for ongoing surveillance.      Vickie Thompson ,DO,  FACG 10/04/2018, 3:46 PM

## 2018-10-04 NOTE — Patient Instructions (Signed)
If you are age 46 or older, your body mass index should be between 23-30. Your Body mass index is 26.36 kg/m. If this is out of the aforementioned range listed, please consider follow up with your Primary Care Provider.  If you are age 68 or younger, your body mass index should be between 19-25. Your Body mass index is 26.36 kg/m. If this is out of the aformentioned range listed, please consider follow up with your Primary Care Provider.   HEMORRHOID BANDING PROCEDURE    FOLLOW-UP CARE   1. The procedure you have had should have been relatively painless since the banding of the area involved does not have nerve endings and there is no pain sensation.  The rubber band cuts off the blood supply to the hemorrhoid and the band may fall off as soon as 48 hours after the banding (the band may occasionally be seen in the toilet bowl following a bowel movement). You may notice a temporary feeling of fullness in the rectum which should respond adequately to plain Tylenol or Motrin.  2. Following the banding, avoid strenuous exercise that evening and resume full activity the next day.  A sitz bath (soaking in a warm tub) or bidet is soothing, and can be useful for cleansing the area after bowel movements.     3. To avoid constipation, take two tablespoons of natural wheat bran, natural oat bran, flax, Benefiber or any over the counter fiber supplement and increase your water intake to 7-8 glasses daily.    4. Unless you have been prescribed anorectal medication, do not put anything inside your rectum for two weeks: No suppositories, enemas, fingers, etc.  5. Occasionally, you may have more bleeding than usual after the banding procedure.  This is often from the untreated hemorrhoids rather than the treated one.  Don't be concerned if there is a tablespoon or so of blood.  If there is more blood than this, lie flat with your bottom higher than your head and apply an ice pack to the area. If the bleeding  does not stop within a half an hour or if you feel faint, call our office at [phone] or go to the emergency room.  6. Problems are not common; however, if there is a substantial amount of bleeding, severe pain, chills, fever or difficulty passing urine (very rare) or other problems, you should call us at [phone] or report to the nearest emergency room.  7. Do not stay seated continuously for more than 2-3 hours for a day or two after the procedure.  Tighten your buttock muscles 10-15 times every two hours and take 10-15 deep breaths every 1-2 hours.  Do not spend more than a few minutes on the toilet if you cannot empty your bowel; instead re-visit the toilet at a later time.     It was a pleasure to see you today!  Vito Cirigliano, D.O.

## 2018-10-23 DIAGNOSIS — H5213 Myopia, bilateral: Secondary | ICD-10-CM | POA: Diagnosis not present

## 2018-12-06 MED FILL — PIMTREA 28 DAY TABLET: 0.15-0.02/0 | 84 days supply | Qty: 84 | Fill #3

## 2018-12-06 MED FILL — ROSUVASTATIN CALCIUM 40 MG: 40 | 90 days supply | Qty: 90 | Fill #1

## 2019-02-26 MED FILL — PIMTREA 28 DAY TABLET: 0.15-0.02/0 | 84 days supply | Qty: 84 | Fill #0

## 2019-04-12 ENCOUNTER — Other Ambulatory Visit: Payer: Self-pay | Admitting: Family Medicine

## 2019-04-12 MED FILL — PANTOPRAZOLE SOD DR 40 MG T: 40 | 90 days supply | Qty: 90 | Fill #0

## 2019-04-13 MED FILL — ROSUVASTATIN CALCIUM 40 MG: 40 | 90 days supply | Qty: 90 | Fill #2

## 2019-05-23 MED FILL — PIMTREA 28 DAY TABLET: 0.15-0.02/0 | 84 days supply | Qty: 84 | Fill #0

## 2019-06-06 ENCOUNTER — Other Ambulatory Visit (HOSPITAL_BASED_OUTPATIENT_CLINIC_OR_DEPARTMENT_OTHER): Payer: Self-pay | Admitting: Family Medicine

## 2019-06-06 DIAGNOSIS — Z1231 Encounter for screening mammogram for malignant neoplasm of breast: Secondary | ICD-10-CM

## 2019-06-14 ENCOUNTER — Ambulatory Visit (HOSPITAL_BASED_OUTPATIENT_CLINIC_OR_DEPARTMENT_OTHER): Payer: 59

## 2019-06-26 ENCOUNTER — Ambulatory Visit (HOSPITAL_BASED_OUTPATIENT_CLINIC_OR_DEPARTMENT_OTHER)
Admission: RE | Admit: 2019-06-26 | Discharge: 2019-06-26 | Disposition: A | Discharge status: Home / Self Care | Payer: No Typology Code available for payment source | Source: Ambulatory Visit | Attending: Family Medicine | Admitting: Family Medicine

## 2019-06-26 ENCOUNTER — Encounter (HOSPITAL_BASED_OUTPATIENT_CLINIC_OR_DEPARTMENT_OTHER): Payer: Self-pay

## 2019-06-26 ENCOUNTER — Other Ambulatory Visit: Payer: Self-pay

## 2019-06-26 DIAGNOSIS — Z1231 Encounter for screening mammogram for malignant neoplasm of breast: Secondary | ICD-10-CM | POA: Insufficient documentation

## 2019-08-09 ENCOUNTER — Other Ambulatory Visit: Payer: Self-pay | Admitting: Family Medicine

## 2019-08-09 MED FILL — ROSUVASTATIN CALCIUM 40 MG: 40 | 90 days supply | Qty: 90 | Fill #0

## 2019-08-15 ENCOUNTER — Other Ambulatory Visit: Payer: Self-pay | Admitting: Family Medicine

## 2019-08-15 ENCOUNTER — Other Ambulatory Visit: Payer: Self-pay

## 2019-08-15 ENCOUNTER — Telehealth: Payer: Self-pay

## 2019-08-15 MED FILL — PANTOPRAZOLE SOD DR 40 MG T: 40 | 90 days supply | Qty: 90 | Fill #1

## 2019-08-15 NOTE — Telephone Encounter (Signed)
Last OV 05/2018- will need an appt.

## 2019-08-15 NOTE — Telephone Encounter (Signed)
Appt scheduled 08/17/2019.

## 2019-08-15 NOTE — Telephone Encounter (Signed)
Copied from [place] [id number]. Topic: General - Other >> Aug 15, 2019 11:23 AM Rainey Pines A wrote: Patient  would like to speak with Dr. Charlett Blake nurse about pain medication for her wrist from a fall patient had

## 2019-08-16 MED FILL — PIMTREA 28 DAY TABLET: 0.15-0.02/0 | 84 days supply | Qty: 84 | Fill #0

## 2019-08-17 ENCOUNTER — Other Ambulatory Visit: Payer: Self-pay

## 2019-08-17 ENCOUNTER — Ambulatory Visit (INDEPENDENT_AMBULATORY_CARE_PROVIDER_SITE_OTHER): Payer: No Typology Code available for payment source | Admitting: Family Medicine

## 2019-08-17 VITALS — Wt 138.0 lb

## 2019-08-17 DIAGNOSIS — E559 Vitamin D deficiency, unspecified: Secondary | ICD-10-CM | POA: Diagnosis not present

## 2019-08-17 DIAGNOSIS — E785 Hyperlipidemia, unspecified: Secondary | ICD-10-CM

## 2019-08-17 DIAGNOSIS — M25532 Pain in left wrist: Secondary | ICD-10-CM | POA: Diagnosis not present

## 2019-08-17 DIAGNOSIS — M25521 Pain in right elbow: Secondary | ICD-10-CM | POA: Diagnosis not present

## 2019-08-17 DIAGNOSIS — Z8632 Personal history of gestational diabetes: Secondary | ICD-10-CM

## 2019-08-17 DIAGNOSIS — R739 Hyperglycemia, unspecified: Secondary | ICD-10-CM

## 2019-08-17 MED ORDER — NAPROXEN 375 MG PO TABS: 375.0000 mg | ORAL_TABLET | Freq: Two times a day (BID) | ORAL | 1 refills | Status: DC | PRN

## 2019-08-17 MED FILL — NAPROXEN 375 MG TABLET: 375 | 30 days supply | Qty: 60 | Fill #0
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
## 2019-08-19 DIAGNOSIS — M25532 Pain in left wrist: Secondary | ICD-10-CM | POA: Insufficient documentation

## 2019-08-19 DIAGNOSIS — M25521 Pain in right elbow: Secondary | ICD-10-CM

## 2019-08-19 HISTORY — DX: Pain in right elbow: M25.521

## 2019-08-19 HISTORY — DX: Pain in left wrist: M25.532

## 2019-08-19 NOTE — Assessment & Plan Note (Signed)
hgba1c acceptable, minimize simple carbs. Increase exercise as tolerated.  

## 2019-08-19 NOTE — Assessment & Plan Note (Signed)
Along ulnar aspect after a fall 3 weeks ago. Not improving. Will proceed with imaging and sports medicine consult
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
## 2019-08-19 NOTE — Assessment & Plan Note (Signed)
Encouraged heart healthy diet, increase exercise, avoid trans fats and simple carbs.  

## 2019-08-19 NOTE — Assessment & Plan Note (Signed)
Supplement and monitor 

## 2019-08-19 NOTE — Assessment & Plan Note (Addendum)
[place] about 3 weeks ago has only improved only slightly so will proceed with imaging and referra to sports medicine for evaluation. Given rx for Naproxen 375 mg prn

## 2019-08-21 ENCOUNTER — Ambulatory Visit (HOSPITAL_BASED_OUTPATIENT_CLINIC_OR_DEPARTMENT_OTHER)
Admission: RE | Admit: 2019-08-21 | Discharge: 2019-08-21 | Disposition: A | Discharge status: Home / Self Care | Payer: No Typology Code available for payment source | Source: Ambulatory Visit | Attending: Family Medicine | Admitting: Family Medicine

## 2019-08-21 ENCOUNTER — Other Ambulatory Visit: Payer: Self-pay

## 2019-08-21 DIAGNOSIS — M25521 Pain in right elbow: Secondary | ICD-10-CM | POA: Insufficient documentation

## 2019-08-21 DIAGNOSIS — M25532 Pain in left wrist: Secondary | ICD-10-CM | POA: Insufficient documentation

## 2019-08-22 ENCOUNTER — Encounter: Payer: Self-pay | Admitting: Family Medicine

## 2019-09-04 ENCOUNTER — Other Ambulatory Visit: Payer: Self-pay

## 2019-09-04 ENCOUNTER — Emergency Department
Admission: EM | Admit: 2019-09-04 | Discharge: 2019-09-04 | Disposition: A | Discharge status: Home / Self Care | Payer: No Typology Code available for payment source | Source: Home / Self Care | Attending: Internal Medicine | Admitting: Internal Medicine

## 2019-09-04 DIAGNOSIS — R030 Elevated blood-pressure reading, without diagnosis of hypertension: Secondary | ICD-10-CM | POA: Diagnosis not present

## 2019-09-04 LAB — POCT CBC W AUTO DIFF (K'VILLE URGENT CARE)

## 2019-09-04 MED ORDER — HYDROXYZINE HCL 25 MG PO TABS: 25.0000 mg | ORAL_TABLET | Freq: Once | ORAL | Status: DC

## 2019-09-04 MED ORDER — HYDROXYZINE HCL 10 MG PO TABS
10.0000 mg | ORAL_TABLET | Freq: Once | ORAL | Status: AC
  Administered 2019-09-04: 19:00:00 10 mg via ORAL

## 2019-09-04 NOTE — ED Triage Notes (Signed)
Pt states that BP was up today.  Started Friday night being high. Pt was lightheaded this am at work.

## 2019-09-04 NOTE — ED Provider Notes (Signed)
[facility]    CSN: [id number] Arrival date & time: 09/04/19  1712      History   Chief Complaint Chief Complaint  Patient presents with  . Hypertension    HPI CHRISTIANE WINNICKI is a 48 y.o. female with history of hyperlipidemia on Crestor comes to urgent care on account of elevated blood pressure over the past 3 days.  Patient said she has been feeling lightheaded over the past few days and has checked her blood pressure a few times over the past few days.  Blood pressure checks have been elevated with systolic ranging from [id number] to 0000000 mmHg and diastolic ranging from 90 to 104 mmHg.  She denies any headaches.  No numbness or tingling.  No facial deviation.  No extremity weakness.  Patient says that she has been stressed with work.  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  No abdominal pain.  No nausea vomiting.   HPI  Past Medical History:  Diagnosis Date  . Contraceptive management 07/13/2015  . Contraceptive management 07/13/2015  . Contraceptive management 07/13/2015   Menarche at 17 Regular and moderate flow No history of abnormal pap in past G2P2, s/p 2 SVD No history of abnormal MGM No concerns today No gyn surgeries LMP 12/15/2015, normal, 5 days   . GERD (gastroesophageal reflux disease)   . H/O gestational diabetes mellitus, not currently pregnant   . Hemorrhoid   . Hyperglycemia 05/03/2017  . Hyperlipidemia   . Preventative health care 01/05/2016    Patient Active Problem List   Diagnosis Date Noted  . Left wrist pain 08/19/2019  . Right elbow pain 08/19/2019  . Rectal bleeding 06/04/2018  . Knee pain 06/04/2018  . Hyperglycemia 05/03/2017  . Lateral epicondylitis, left elbow 01/12/2017  . Preventative health care 01/05/2016  . Contraceptive management 07/13/2015  . Vitamin D deficiency 02/20/2012  . Hyperlipidemia   . H/O gestational diabetes mellitus, not currently pregnant     History reviewed. No pertinent surgical history.  OB History     Gravida  2   Para  2   Term  2   Preterm      AB      Living        SAB      TAB      Ectopic      Multiple      Live Births               Home Medications    Prior to Admission medications   Medication Sig Start Date End Date Taking? Authorizing Provider  Cholecalciferol (VITAMIN D) 2000 units CAPS Take 1 capsule (2,000 Units total) by mouth daily. 01/07/17   Mosie Lukes, MD  hydrocortisone (ANUSOL-HC) 25 MG suppository Place 1 suppository (25 mg total) rectally 2 (two) times daily. 05/31/18   Mosie Lukes, MD  naproxen (NAPROSYN) 375 MG tablet Take 1 tablet (375 mg total) by mouth 2 (two) times daily as needed. 08/17/19   Mosie Lukes, MD  pantoprazole (PROTONIX) 40 MG tablet TAKE 1 TABLET (40 MG TOTAL) BY MOUTH DAILY. 04/12/19   Mosie Lukes, MD  PIMTREA 0.15-0.02/0.01 MG (21/5) tablet TAKE 1 TABLET BY MOUTH DAILY. 08/16/19   Mosie Lukes, MD  polyethylene glycol powder (GLYCOLAX/MIRALAX) powder Take 17 g by mouth daily as needed. 05/30/18   Mosie Lukes, MD  rosuvastatin (CRESTOR) 40 MG tablet TAKE 1 TABLET BY MOUTH ONCE DAILY 08/09/19  Mosie Lukes, MD  Wheat Dextrin (BENEFIBER) POWD Take 1 Dose by mouth daily as needed. 05/30/18   Mosie Lukes, MD    Family History Family History  Problem Relation Age of Onset  . Diabetes Father   . Hypertension Father   . Mental illness Mother        depression  . Hypertension Sister   . Gout Brother   . Cancer Maternal Aunt        lung  . Colon cancer Neg Hx   . Esophageal cancer Neg Hx   . Rectal cancer Neg Hx   . Stomach cancer Neg Hx     Social History Social History   Tobacco Use  . Smoking status: Never Smoker  . Smokeless tobacco: Never Used  Substance Use Topics  . Alcohol use: Yes    Alcohol/week: 0.0 standard drinks    Comment: occasion  . Drug use: No     Allergies   Retinoids and Sudafed [pseudoephedrine hcl]   Review of Systems Review of Systems  Constitutional:  Negative.   Eyes: Negative.   Respiratory: Negative.   Cardiovascular: Negative.   Gastrointestinal: Negative.   Genitourinary: Negative.   Psychiatric/Behavioral: Negative.      Physical Exam Triage Vital Signs ED Triage Vitals  Enc Vitals Group     BP 09/04/19 1722 (!) 156/92     Pulse Rate 09/04/19 1722 86     Resp 09/04/19 1722 20     Temp 09/04/19 1722 98.6 F (37 C)     Temp Source 09/04/19 1722 Oral     SpO2 09/04/19 1722 98 %     Weight 09/04/19 1723 140 lb (63.5 kg)     Height 09/04/19 1723 5' (1.524 m)     Head Circumference --      Peak Flow --      Pain Score 09/04/19 1723 0     Pain Loc --      Pain Edu? --      Excl. in [place]? --    No data found.  Updated Vital Signs BP (!) 156/92   Pulse 86   Temp 98.6 F (37 C) (Oral)   Resp 20   Ht 5' (1.524 m)   Wt 63.5 kg   LMP 06/19/2019   SpO2 98%   BMI 27.34 kg/m   Visual Acuity Right Eye Distance:   Left Eye Distance:   Bilateral Distance:    Right Eye Near:   Left Eye Near:    Bilateral Near:     Physical Exam Constitutional:      General: She is in acute distress.     Appearance: Normal appearance. She is not ill-appearing.  HENT:     Right Ear: Tympanic membrane normal.     Left Ear: Tympanic membrane normal.     Nose: Nose normal.  Neck:     Musculoskeletal: Normal range of motion and neck supple.  Cardiovascular:     Rate and Rhythm: Normal rate and regular rhythm.     Pulses: Normal pulses.     Heart sounds: Normal heart sounds.  Pulmonary:     Effort: Pulmonary effort is normal. No respiratory distress.     Breath sounds: Normal breath sounds. No rhonchi or rales.  Abdominal:     General: Bowel sounds are normal. There is no distension.     Palpations: Abdomen is soft.     Hernia: No hernia is present.  Skin:    Capillary  Refill: Capillary refill takes less than 2 seconds.  Neurological:     General: No focal deficit present.     Mental Status: She is alert and oriented to  person, place, and time. Mental status is at baseline.     Cranial Nerves: No cranial nerve deficit.     Sensory: No sensory deficit.     Motor: No weakness.     Coordination: Coordination normal.     Gait: Gait normal.     Deep Tendon Reflexes: Reflexes normal.  Psychiatric:        Mood and Affect: Mood normal.        Thought Content: Thought content normal.      UC Treatments / Results  Labs (all labs ordered are listed, but only abnormal results are displayed) Labs Reviewed  BASIC METABOLIC PANEL - Abnormal; Notable for the following components:      Result Value   Glucose, Bld 111 (*)    All other components within normal limits  TSH  POCT CBC W AUTO DIFF ([facility])    EKG   Radiology No results found.  Procedures Procedures (including critical care time)  Medications Ordered in UC Medications  hydrOXYzine (ATARAX/VISTARIL) tablet 10 mg (10 mg Oral Given 09/04/19 1838)    Initial Impression / Assessment and Plan / UC Course  I have reviewed the triage vital signs and the nursing notes.  Pertinent labs & imaging results that were available during my care of the patient were reviewed by me and considered in my medical decision making (see chart for details).     1.  Elevated blood pressure without the diagnosis of hypertension: Patient is encouraged to measure her blood pressure at home routinely over the next few weeks If blood pressure remains elevated, she will be a candidate for antihypertensive medication At this time the patient is not a candidate for starting antihypertensive medications. EKG showed normal sinus rhythm CBC was unremarkable Basic metabolic panel and TSH are pending. If patient develops neurologic symptoms or worsening headache she is advised to return to urgent care to be reevaluated. Patient was given a dose of Vistaril was in the urgent care. Final Clinical Impressions(s) / UC Diagnoses   Final diagnoses:  Elevated BP  without diagnosis of hypertension   Discharge Instructions   None    ED Prescriptions    None     PDMP not reviewed this encounter.   Chase Picket, MD 09/05/19 [phone]

## 2019-09-05 ENCOUNTER — Telehealth: Payer: Self-pay | Admitting: *Deleted

## 2019-09-05 LAB — BASIC METABOLIC PANEL
BUN: 13 mg/dL (ref 7–25)
CO2: 26 mmol/L (ref 20–32)
Calcium: 10.1 mg/dL (ref 8.6–10.2)
Chloride: 105 mmol/L (ref 98–110)
Creat: 0.72 mg/dL (ref 0.50–1.10)
Glucose, Bld: 111 mg/dL — ABNORMAL HIGH (ref 65–99)
Potassium: 4.1 mmol/L (ref 3.5–5.3)
Sodium: 139 mmol/L (ref 135–146)

## 2019-09-05 NOTE — Telephone Encounter (Signed)
Copied from [place] [id number]. Topic: General - Other >> Sep 05, 2019 11:09 AM Yvette Rack wrote: Reason for CRM: Pt stated she was told to call pcp to report that she went to Urgent Care for elevated blood pressure.

## 2019-09-05 NOTE — Telephone Encounter (Signed)
We should do a follow up next week to see if her pressure is down.

## 2019-09-06 LAB — TSH: TSH: 3.33 mIU/L

## 2019-09-06 NOTE — Telephone Encounter (Signed)
Left message on machine to call back to make appointment for next week.

## 2019-09-07 ENCOUNTER — Telehealth: Payer: Self-pay | Admitting: Emergency Medicine

## 2019-09-07 NOTE — Telephone Encounter (Signed)
No significant abnormalities on lab work. Attempted to reach patient to inform her, no answer.

## 2019-09-13 ENCOUNTER — Encounter: Payer: Self-pay | Admitting: Family Medicine

## 2019-09-13 ENCOUNTER — Ambulatory Visit (INDEPENDENT_AMBULATORY_CARE_PROVIDER_SITE_OTHER): Payer: No Typology Code available for payment source | Admitting: Family Medicine

## 2019-09-13 ENCOUNTER — Other Ambulatory Visit: Payer: Self-pay

## 2019-09-13 DIAGNOSIS — R739 Hyperglycemia, unspecified: Secondary | ICD-10-CM

## 2019-09-13 DIAGNOSIS — E785 Hyperlipidemia, unspecified: Secondary | ICD-10-CM

## 2019-09-13 DIAGNOSIS — E559 Vitamin D deficiency, unspecified: Secondary | ICD-10-CM

## 2019-09-13 DIAGNOSIS — G4700 Insomnia, unspecified: Secondary | ICD-10-CM | POA: Diagnosis not present

## 2019-09-13 DIAGNOSIS — K219 Gastro-esophageal reflux disease without esophagitis: Secondary | ICD-10-CM

## 2019-09-13 MED ORDER — FAMOTIDINE 20 MG PO TABS: 20.0000 mg | ORAL_TABLET | Freq: Two times a day (BID) | ORAL | 1 refills | Status: DC

## 2019-09-13 MED ORDER — METOPROLOL SUCCINATE ER 25 MG PO TB24: 25.0000 mg | ORAL_TABLET | Freq: Every day | ORAL | 5 refills | Status: DC

## 2019-09-13 MED FILL — METOPROLOL SUCCINATE ER 25: 25 | 30 days supply | Qty: 30 | Fill #0

## 2019-09-13 MED FILL — FAMOTIDINE 20 MG TABS: 20 | 90 days supply | Qty: 180 | Fill #0

## 2019-09-13 NOTE — Progress Notes (Signed)
Has dry eyes.

## 2019-09-16 DIAGNOSIS — K219 Gastro-esophageal reflux disease without esophagitis: Secondary | ICD-10-CM | POA: Insufficient documentation

## 2019-09-16 DIAGNOSIS — G4700 Insomnia, unspecified: Secondary | ICD-10-CM | POA: Insufficient documentation

## 2019-09-16 HISTORY — DX: Gastro-esophageal reflux disease without esophagitis: K21.9

## 2019-09-16 NOTE — Assessment & Plan Note (Signed)
Encouraged good sleep hygiene such as dark, quiet room. No blue/green glowing lights such as computer screens in bedroom. No alcohol or stimulants in evening. Cut down on caffeine as able. Regular exercise is helpful but not just prior to bed time. Melatonin prn

## 2019-09-16 NOTE — Progress Notes (Signed)
Patient ID: Vickie Thompson, Vickie Thompson   DOB: June 29, 1971, 48 y.o.   MRN: [id number] Virtual Visit via Video Note  I connected with Vickie Thompson on 09/16/19 at  3:00 PM EST by a video enabled telemedicine application and verified that I am speaking with the correct person using two identifiers.  Location: Patient: home Provider: office   I discussed the limitations of evaluation and management by telemedicine and the availability of in person appointments. The patient expressed understanding and agreed to proceed. Princess Eulas Post CMA was able to get the patient set up on a video visit.    Subjective:    Patient ID: Vickie Thompson, Vickie Thompson    DOB: 20-Dec-1970, 48 y.o.   MRN: [id number]  No chief complaint on file.   HPI Patient is in today for follow up on chronic medical concerns including hyperlipidemia, vitamin D deficiency and more. She is doing well. No recent febrile illness or hospitalizaitons. She is trying to maintain a heart healthy diet and is walking most days. She is struggling with restless sleep though. Denies CP/palp/SOB/HA/congestion/fevers/GI or GU c/o. Taking meds as prescribed  Past Medical History:  Diagnosis Date  . Contraceptive management 07/13/2015  . Contraceptive management 07/13/2015  . Contraceptive management 07/13/2015   Menarche at 17 Regular and moderate flow No history of abnormal pap in past G2P2, s/p 2 SVD No history of abnormal MGM No concerns today No gyn surgeries LMP 12/15/2015, normal, 5 days   . GERD (gastroesophageal reflux disease)   . H/O gestational diabetes mellitus, not currently pregnant   . Hemorrhoid   . Hyperglycemia 05/03/2017  . Hyperlipidemia   . Preventative health care 01/05/2016    No past surgical history on file.  Family History  Problem Relation Age of Onset  . Diabetes Father   . Hypertension Father   . Mental illness Mother        depression  . Hypertension Sister   . Gout Brother   . Cancer Maternal Aunt        lung  .  Colon cancer Neg Hx   . Esophageal cancer Neg Hx   . Rectal cancer Neg Hx   . Stomach cancer Neg Hx     Social History   Socioeconomic History  . Marital status: Married    Spouse name: Not on file  . Number of children: 2  . Years of education: Not on file  . Highest education level: Not on file  Occupational History  . Not on file  Social Needs  . Financial resource strain: Not on file  . Food insecurity    Worry: Not on file    Inability: Not on file  . Transportation needs    Medical: Not on file    Non-medical: Not on file  Tobacco Use  . Smoking status: Never Smoker  . Smokeless tobacco: Never Used  Substance and Sexual Activity  . Alcohol use: Yes    Alcohol/week: 0.0 standard drinks    Comment: occasion  . Drug use: No  . 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 Physical activity    Days per week: Not on file    Minutes per session: Not on file  . Stress: Not on file  Relationships  . Social connections    Talks on phone: Not on file  Gets together: Not on file    Attends religious service: Not on file    Active member of club or organization: Not on file    Attends meetings of clubs or organizations: Not on file    Relationship status: Not on file  . Intimate partner violence    Fear of current or ex partner: Not on file    Emotionally abused: Not on file    Physically abused: Not on file    Forced sexual activity: Not on file  Other Topics Concern  . Not on file  Social History Narrative  . Not on file    Outpatient Medications Prior to Visit  Medication Sig Dispense Refill  . Cholecalciferol (VITAMIN D) 2000 units CAPS Take 1 capsule (2,000 Units total) by mouth daily. 30 capsule 11  . hydrocortisone (ANUSOL-HC) 25 MG suppository Place 1 suppository (25 mg total) rectally 2 (two) times daily. 12 suppository 0   . naproxen (NAPROSYN) 375 MG tablet Take 1 tablet (375 mg total) by mouth 2 (two) times daily as needed. 60 tablet 1  . pantoprazole (PROTONIX) 40 MG tablet TAKE 1 TABLET (40 MG TOTAL) BY MOUTH DAILY. 90 tablet 1  . PIMTREA 0.15-0.02/0.01 MG (21/5) tablet TAKE 1 TABLET BY MOUTH DAILY. 84 tablet 3  . polyethylene glycol powder (GLYCOLAX/MIRALAX) powder Take 17 g by mouth daily as needed. 3350 g 5  . rosuvastatin (CRESTOR) 40 MG tablet TAKE 1 TABLET BY MOUTH ONCE DAILY 90 tablet 3  . Wheat Dextrin (BENEFIBER) POWD Take 1 Dose by mouth daily as needed. 730 g 5   No facility-administered medications prior to visit.     Allergies  Allergen Reactions  . Retinoids Shortness Of Breath  . Sudafed [Pseudoephedrine Hcl] Palpitations    Review of Systems  Constitutional: Negative for fever and malaise/fatigue.  HENT: Negative for congestion.   Eyes: Negative for blurred vision.  Respiratory: Negative for shortness of breath.   Cardiovascular: Negative for chest pain, palpitations and leg swelling.  Gastrointestinal: Negative for abdominal pain, blood in stool and nausea.  Genitourinary: Negative for dysuria and frequency.  Musculoskeletal: Negative for falls.  Skin: Negative for rash.  Neurological: Negative for dizziness, loss of consciousness and headaches.  Endo/Heme/Allergies: Negative for environmental allergies.  Psychiatric/Behavioral: Negative for depression. The patient has insomnia. The patient is not nervous/anxious.        Objective:    Physical Exam  BP 127/71 (BP Location: Left Arm, Patient Position: Sitting, Cuff Size: Normal)   Wt 137 lb (62.1 kg)   LMP 06/19/2019   BMI 26.76 kg/m  Wt Readings from Last 3 Encounters:  09/13/19 137 lb (62.1 kg)  09/04/19 140 lb (63.5 kg)  08/17/19 138 lb (62.6 kg)    Diabetic Foot Exam - Simple   No data filed     Lab Results  Component Value Date   WBC 9.0 05/30/2018   HGB 13.7 05/30/2018   HCT 40.8 05/30/2018   PLT 303.0  05/30/2018   GLUCOSE 111 (H) 09/04/2019   CHOL 209 (H) 05/30/2018   TRIG 228.0 (H) 05/30/2018   HDL 62.70 05/30/2018   LDLDIRECT 112.0 05/30/2018   LDLCALC 73 08/10/2017   ALT 22 05/30/2018   AST 21 05/30/2018   NA 139 09/04/2019   K 4.1 09/04/2019   CL 105 09/04/2019   CREATININE 0.72 09/04/2019   BUN 13 09/04/2019   CO2 26 09/04/2019   TSH 3.33 09/04/2019   HGBA1C 6.1 05/30/2018    Lab Results  Component Value Date   TSH 3.33 09/04/2019   Lab Results  Component Value Date   WBC 9.0 05/30/2018   HGB 13.7 05/30/2018   HCT 40.8 05/30/2018   MCV 90.7 05/30/2018   PLT 303.0 05/30/2018   Lab Results  Component Value Date   NA 139 09/04/2019   K 4.1 09/04/2019   CO2 26 09/04/2019   GLUCOSE 111 (H) 09/04/2019   BUN 13 09/04/2019   CREATININE 0.72 09/04/2019   BILITOT 0.6 05/30/2018   ALKPHOS 60 05/30/2018   AST 21 05/30/2018   ALT 22 05/30/2018   PROT 7.3 05/30/2018   ALBUMIN 4.4 05/30/2018   CALCIUM 10.1 09/04/2019   GFR 92.55 05/30/2018   Lab Results  Component Value Date   CHOL 209 (H) 05/30/2018   Lab Results  Component Value Date   HDL 62.70 05/30/2018   Lab Results  Component Value Date   LDLCALC 73 08/10/2017   Lab Results  Component Value Date   TRIG 228.0 (H) 05/30/2018   Lab Results  Component Value Date   CHOLHDL 3 05/30/2018   Lab Results  Component Value Date   HGBA1C 6.1 05/30/2018       Assessment & Plan:   Problem List Items Addressed This Visit    Hyperlipidemia    Encouraged heart healthy diet, increase exercise, avoid trans fats, consider a krill oil cap daily. Tolerating Rosuvastatin.      Relevant Medications   metoprolol succinate (TOPROL-XL) 25 MG 24 hr tablet   Vitamin D deficiency    Supplement and monitor      Hyperglycemia    hgba1c acceptable, minimize simple carbs. Increase exercise as tolerated.       Insomnia    Encouraged good sleep hygiene such as dark, quiet room. No blue/green glowing lights such  as computer screens in bedroom. No alcohol or stimulants in evening. Cut down on caffeine as able. Regular exercise is helpful but not just prior to bed time. Melatonin prn      Acid reflux    Avoid offending foods, start probiotics. Do not eat large meals in late evening and consider raising head of bed.       Relevant Medications   famotidine (PEPCID) 20 MG tablet      I am having Vickie Thompson. Vickie Thompson start on metoprolol succinate and famotidine. I am also having her maintain her Vitamin D, polyethylene glycol powder, Benefiber, hydrocortisone, pantoprazole, rosuvastatin, Pimtrea, and naproxen.  Meds ordered this encounter  Medications  . metoprolol succinate (TOPROL-XL) 25 MG 24 hr tablet    Sig: Take 1 tablet (25 mg total) by mouth daily.    Dispense:  30 tablet    Refill:  5  . famotidine (PEPCID) 20 MG tablet    Sig: Take 1 tablet (20 mg total) by mouth 2 (two) times daily.    Dispense:  180 tablet    Refill:  1     I discussed the assessment and treatment plan with the patient. The patient was provided an opportunity to ask questions and all were answered. The patient agreed with the plan and demonstrated an understanding of the instructions.   The patient was advised to call back or seek an in-person evaluation if the symptoms worsen or if the condition fails to improve as anticipated.  I provided 25 minutes of non-face-to-face time during this encounter.   Penni Homans, MD

## 2019-09-16 NOTE — Assessment & Plan Note (Signed)
Encouraged heart healthy diet, increase exercise, avoid trans fats, consider a krill oil cap daily. Tolerating Rosuvastatin 

## 2019-09-16 NOTE — Assessment & Plan Note (Signed)
hgba1c acceptable, minimize simple carbs. Increase exercise as tolerated.  

## 2019-09-16 NOTE — Assessment & Plan Note (Signed)
Avoid offending foods, start probiotics. Do not eat large meals in late evening and consider raising head of bed.  

## 2019-09-16 NOTE — Assessment & Plan Note (Signed)
Supplement and monitor 

## 2019-10-22 MED FILL — METOPROLOL SUCCINATE ER 25: 25 | 90 days supply | Qty: 90 | Fill #1

## 2019-11-06 MED FILL — PIMTREA 28 DAY TABLET: 0.15-0.02/0 | 84 days supply | Qty: 84 | Fill #1

## 2019-11-22 ENCOUNTER — Other Ambulatory Visit: Payer: Self-pay | Admitting: Family Medicine

## 2019-11-22 MED FILL — PANTOPRAZOLE SOD DR 40 MG T: 40 | 90 days supply | Qty: 90 | Fill #0

## 2019-11-28 MED FILL — ROSUVASTATIN CALCIUM 40 MG: 40 | 90 days supply | Qty: 90 | Fill #1

## 2019-12-26 ENCOUNTER — Telehealth: Payer: Self-pay | Admitting: Family Medicine

## 2019-12-26 DIAGNOSIS — L600 Ingrowing nail: Secondary | ICD-10-CM

## 2019-12-26 NOTE — Telephone Encounter (Signed)
PT calling to request for a referral for a Podiatry for an in growing toe nail.  Please call and advise

## 2019-12-27 DIAGNOSIS — Z135 Encounter for screening for eye and ear disorders: Secondary | ICD-10-CM | POA: Diagnosis not present

## 2019-12-27 DIAGNOSIS — H524 Presbyopia: Secondary | ICD-10-CM | POA: Diagnosis not present

## 2019-12-27 NOTE — Telephone Encounter (Signed)
Referral has been placed. 

## 2020-01-15 ENCOUNTER — Ambulatory Visit: Payer: No Typology Code available for payment source | Admitting: Podiatry

## 2020-01-15 ENCOUNTER — Encounter: Payer: Self-pay | Admitting: Podiatry

## 2020-01-15 ENCOUNTER — Other Ambulatory Visit: Payer: Self-pay

## 2020-01-15 VITALS — BP 155/87 | HR 85 | Resp 16

## 2020-01-15 DIAGNOSIS — L600 Ingrowing nail: Secondary | ICD-10-CM | POA: Diagnosis not present

## 2020-01-15 MED ORDER — NEOMYCIN-POLYMYXIN-HC 1 % OT SOLN: OTIC | 1 refills | Status: DC

## 2020-01-15 MED FILL — NEO/POLYMYXIN/HC EAR SOLN: 3.5-10000-1 | 50 days supply | Qty: 10 | Fill #0

## 2020-01-15 NOTE — Progress Notes (Signed)
Subjective:  Patient ID: Vickie Thompson, female    DOB: 1970/12/13,  MRN: [id number] HPI Chief Complaint  Patient presents with  . Toe Pain    Hallux right - medial border x 1 week, last week it was red, swollen and draining, cleaned the side out and took Ibuprofen-better today but concerned about it  . New Patient (Initial Visit)    49 y.o. female presents with the above complaint.   ROS: Denies fever chills nausea vomiting muscle aches pains calf pain back pain chest pain shortness of breath.  Past Medical History:  Diagnosis Date  . Contraceptive management 07/13/2015  . Contraceptive management 07/13/2015  . Contraceptive management 07/13/2015   Menarche at 17 Regular and moderate flow No history of abnormal pap in past G2P2, s/p 2 SVD No history of abnormal MGM No concerns today No gyn surgeries LMP 12/15/2015, normal, 5 days   . GERD (gastroesophageal reflux disease)   . H/O gestational diabetes mellitus, not currently pregnant   . Hemorrhoid   . Hyperglycemia 05/03/2017  . Hyperlipidemia   . Preventative health care 01/05/2016   No past surgical history on file.  Current Outpatient Medications:  .  Ascorbic Acid (VITAMIN C PO), Take by mouth., Disp: , Rfl:  .  Zinc Acetate, Oral, (ZINC ACETATE PO), Take by mouth., Disp: , Rfl:  .  metoprolol succinate (TOPROL-XL) 25 MG 24 hr tablet, Take 1 tablet (25 mg total) by mouth daily., Disp: 30 tablet, Rfl: 5 .  NEOMYCIN-POLYMYXIN-HYDROCORTISONE (CORTISPORIN) 1 % SOLN OTIC solution, Apply 1-2 drops to toe BID after soaking, Disp: 10 mL, Rfl: 1 .  PIMTREA 0.15-0.02/0.01 MG (21/5) tablet, TAKE 1 TABLET BY MOUTH DAILY., Disp: 84 tablet, Rfl: 3 .  rosuvastatin (CRESTOR) 40 MG tablet, TAKE 1 TABLET BY MOUTH ONCE DAILY, Disp: 90 tablet, Rfl: 3  Allergies  Allergen Reactions  . Retinoids Shortness Of Breath  . Sudafed [Pseudoephedrine Hcl] Palpitations   Review of Systems Objective:   Vitals:   01/15/20 0859  BP: (!) 155/87  Pulse:  85  Resp: 16    General: Well developed, nourished, in no acute distress, alert and oriented x3   Dermatological: Skin is warm, dry and supple bilateral. Nails x 10 are well maintained; remaining integument appears unremarkable at this time. There are no open sores, no preulcerative lesions, no rash or signs of infection present.  Sharp incurvated nail margin along the tibial border of the hallux right.  Mild erythema no edema cellulitis drainage or odor.  Vascular: Dorsalis Pedis artery and Posterior Tibial artery pedal pulses are 2/4 bilateral with immedate capillary fill time. Pedal hair growth present. No varicosities and no lower extremity edema present bilateral.   Neruologic: Grossly intact via light touch bilateral. Vibratory intact via tuning fork bilateral. Protective threshold with Semmes Wienstein monofilament intact to all pedal sites bilateral. Patellar and Achilles deep tendon reflexes 2+ bilateral. No Babinski or clonus noted bilateral.   Musculoskeletal: No gross boney pedal deformities bilateral. No pain, crepitus, or limitation noted with foot and ankle range of motion bilateral. Muscular strength 5/5 in all groups tested bilateral.  Gait: Unassisted, Nonantalgic.    Radiographs:  None taken  Assessment & Plan:   Assessment: Chemical matrixectomy tibial border hallux right  Plan: Discussed etiology pathology conservative versus surgical therapies.  At this point performed a chemical matrixectomy to the tibial border of the hallux right.  She was provided with both oral and written home-going instructions for care and soaking of the  toe she tolerated the procedure well without complications.  She also received a prescription which was sent over electronically for Corticosporin otic to be applied twice daily after soaking.  I will follow-up with her in 2 weeks     Brinleigh Tew T. [place], [place]

## 2020-01-15 NOTE — Patient Instructions (Signed)

## 2020-01-30 MED FILL — METOPROLOL SUCCINATE ER 25: 25 | 30 days supply | Qty: 60 | Fill #2

## 2020-01-30 MED FILL — PIMTREA 28 DAY TABLET: 0.15-0.02/0 | 84 days supply | Qty: 84 | Fill #2

## 2020-02-05 ENCOUNTER — Ambulatory Visit: Payer: No Typology Code available for payment source | Admitting: Podiatry

## 2020-03-13 ENCOUNTER — Other Ambulatory Visit: Payer: Self-pay

## 2020-03-13 ENCOUNTER — Other Ambulatory Visit (INDEPENDENT_AMBULATORY_CARE_PROVIDER_SITE_OTHER): Payer: 59

## 2020-03-13 DIAGNOSIS — E559 Vitamin D deficiency, unspecified: Secondary | ICD-10-CM

## 2020-03-13 DIAGNOSIS — R739 Hyperglycemia, unspecified: Secondary | ICD-10-CM | POA: Diagnosis not present

## 2020-03-13 DIAGNOSIS — E785 Hyperlipidemia, unspecified: Secondary | ICD-10-CM

## 2020-03-13 LAB — COMPREHENSIVE METABOLIC PANEL
ALT: 22 U/L (ref 0–35)
AST: 24 U/L (ref 0–37)
Albumin: 4.3 g/dL (ref 3.5–5.2)
Alkaline Phosphatase: 74 U/L (ref 39–117)
BUN: 15 mg/dL (ref 6–23)
CO2: 29 mEq/L (ref 19–32)
Calcium: 9.3 mg/dL (ref 8.4–10.5)
Chloride: 103 mEq/L (ref 96–112)
Creatinine, Ser: 0.65 mg/dL (ref 0.40–1.20)
GFR: 97.24 mL/min (ref >60.00)
Glucose, Bld: 110 mg/dL — ABNORMAL HIGH (ref 70–99)
Potassium: 3.8 mEq/L (ref 3.5–5.1)
Sodium: 137 mEq/L (ref 135–145)
Total Bilirubin: 0.6 mg/dL (ref 0.2–1.2)
Total Protein: 6.9 g/dL (ref 6.0–8.3)

## 2020-03-13 LAB — VITAMIN D 25 HYDROXY (VIT D DEFICIENCY, FRACTURES): VITD: 29.91 ng/mL — ABNORMAL LOW (ref 30.00–100.00)

## 2020-03-13 LAB — CBC
HCT: 39.8 % (ref 36.0–46.0)
Hemoglobin: 13.6 g/dL (ref 12.0–15.0)
MCHC: 34.3 g/dL (ref 30.0–36.0)
MCV: 90.2 fl (ref 78.0–100.0)
Platelets: 267 10*3/uL (ref 150.0–400.0)
RBC: 4.41 Mil/uL (ref 3.87–5.11)
RDW: 12.8 % (ref 11.5–15.5)
WBC: 6.6 10*3/uL (ref 4.0–10.5)

## 2020-03-13 LAB — TSH: TSH: 1.87 u[IU]/mL (ref 0.35–4.50)

## 2020-03-13 LAB — LIPID PANEL
Cholesterol: 204 mg/dL — ABNORMAL HIGH (ref 0–200)
HDL: 51.5 mg/dL (ref >39.00)
NonHDL: 152.11
Total CHOL/HDL Ratio: 4
Triglycerides: 217 mg/dL — ABNORMAL HIGH (ref 0.0–149.0)
VLDL: 43.4 mg/dL — ABNORMAL HIGH (ref 0.0–40.0)

## 2020-03-13 LAB — LDL CHOLESTEROL, DIRECT: Direct LDL: 103 mg/dL

## 2020-03-13 LAB — HEMOGLOBIN A1C: Hgb A1c MFr Bld: 6.2 % (ref 4.6–6.5)

## 2020-03-17 ENCOUNTER — Other Ambulatory Visit: Payer: Self-pay | Admitting: Family Medicine

## 2020-03-17 ENCOUNTER — Ambulatory Visit (INDEPENDENT_AMBULATORY_CARE_PROVIDER_SITE_OTHER): Payer: 59 | Admitting: Family Medicine

## 2020-03-17 ENCOUNTER — Encounter: Payer: Self-pay | Admitting: Family Medicine

## 2020-03-17 ENCOUNTER — Other Ambulatory Visit: Payer: Self-pay

## 2020-03-17 VITALS — BP 120/80 | HR 90 | Temp 96.1°F | Ht 60.5 in | Wt 138.2 lb

## 2020-03-17 DIAGNOSIS — E559 Vitamin D deficiency, unspecified: Secondary | ICD-10-CM | POA: Diagnosis not present

## 2020-03-17 DIAGNOSIS — Z0000 Encounter for general adult medical examination without abnormal findings: Secondary | ICD-10-CM | POA: Diagnosis not present

## 2020-03-17 DIAGNOSIS — R739 Hyperglycemia, unspecified: Secondary | ICD-10-CM

## 2020-03-17 DIAGNOSIS — K219 Gastro-esophageal reflux disease without esophagitis: Secondary | ICD-10-CM

## 2020-03-17 DIAGNOSIS — Z124 Encounter for screening for malignant neoplasm of cervix: Secondary | ICD-10-CM | POA: Diagnosis not present

## 2020-03-17 DIAGNOSIS — E785 Hyperlipidemia, unspecified: Secondary | ICD-10-CM | POA: Diagnosis not present

## 2020-03-17 MED ORDER — FAMOTIDINE 40 MG PO TABS: 40.0000 mg | ORAL_TABLET | Freq: Every day | ORAL | 6 refills | Status: DC

## 2020-03-17 MED FILL — FAMOTIDINE 40 MG TABLET: 40 | 30 days supply | Qty: 30 | Fill #0

## 2020-03-17 NOTE — Assessment & Plan Note (Signed)
hgba1c acceptable, minimize simple carbs. Increase exercise as tolerated.  

## 2020-03-17 NOTE — Patient Instructions (Signed)

## 2020-03-24 NOTE — Assessment & Plan Note (Signed)
Encouraged heart healthy diet, increase exercise, avoid trans fats, consider a krill oil cap daily. Tolerating Rosuvastatin 

## 2020-03-24 NOTE — Assessment & Plan Note (Signed)
Supplement and monitor 

## 2020-03-24 NOTE — Assessment & Plan Note (Signed)
Patient encouraged to maintain heart healthy diet, regular exercise, adequate sleep. Consider daily probiotics. Take medications as prescribed. Referred to OB/GYN for pap at her request. Labs ordered and reviewed

## 2020-03-24 NOTE — Assessment & Plan Note (Signed)
Avoid offending foods, start probiotics. Do not eat large meals in late evening and consider raising head of bed. Started on Famotidine

## 2020-03-24 NOTE — Progress Notes (Signed)
Subjective:    Patient ID: Vickie Thompson, female    DOB: 01/23/1971, 49 y.o.   MRN: [id number]  Chief Complaint  Patient presents with  . Annual Exam    HPI Patient is in today for annual preventative exam and follow up on chronic medical concerns. No recent febrile illness or hospitalizations. She is exercising 2-3 x a week and is maintaining a heart healthy diet with less processed foods. Has noted mildly labile blood pressures. Systolic blood pressures ranging from 110-140s. She otherwise feels well although she has noted an occasional mild headache. Denies CP/palp/SOB/HA/congestion/fevers/GI or GU c/o. Taking meds as prescribed  Past Medical History:  Diagnosis Date  . Contraceptive management 07/13/2015  . Contraceptive management 07/13/2015  . Contraceptive management 07/13/2015   Menarche at 17 Regular and moderate flow No history of abnormal pap in past G2P2, s/p 2 SVD No history of abnormal MGM No concerns today No gyn surgeries LMP 12/15/2015, normal, 5 days   . GERD (gastroesophageal reflux disease)   . H/O gestational diabetes mellitus, not currently pregnant   . Hemorrhoid   . Hyperglycemia 05/03/2017  . Hyperlipidemia   . Preventative health care 01/05/2016    No past surgical history on file.  Family History  Problem Relation Age of Onset  . Diabetes Father   . Hypertension Father   . Mental illness Mother        depression  . Hypertension Sister   . Gout Brother   . Cancer Maternal Aunt        lung  . Colon cancer Neg Hx   . Esophageal cancer Neg Hx   . Rectal cancer Neg Hx   . Stomach cancer Neg Hx     Social History   Socioeconomic History  . Marital status: Married    Spouse name: Not on file  . Number of children: 2  . Years of education: Not on file  . Highest education level: Not on file  Occupational History  . Not on file  Tobacco Use  . Smoking status: Never Smoker  . Smokeless tobacco: Never Used  Substance and Sexual Activity  . Alcohol  use: Yes    Alcohol/week: 0.0 standard drinks    Comment: occasion  . Drug use: No  . Sexual activity: Yes    Partners: Male    Birth control/protection: Pill    Comment: lives with husband and kids, works as Marine scientist, at [employer] restrictions  Other Topics Concern  . Not on file  Social History Narrative  . Not on file   Social Determinants of Health   Financial Resource Strain:   . Difficulty of Paying Living Expenses:   Food Insecurity:   . Worried About Charity fundraiser in the Last Year:   . Arboriculturist in the Last Year:   Transportation Needs:   . Film/video editor (Medical):   Marland Kitchen Lack of Transportation (Non-Medical):   Physical Activity:   . Days of Exercise per Week:   . Minutes of Exercise per Session:   Stress:   . Feeling of Stress :   Social Connections:   . Frequency of Communication with Friends and Family:   . Frequency of Social Gatherings with Friends and Family:   . Attends Religious Services:   . Active Member of Clubs or Organizations:   . Attends Archivist Meetings:   Marland Kitchen Marital Status:   Intimate Partner Violence:   . Fear of Current or  Ex-Partner:   . Emotionally Abused:   Marland Kitchen Physically Abused:   . Sexually Abused:     Outpatient Medications Prior to Visit  Medication Sig Dispense Refill  . Ascorbic Acid (VITAMIN C PO) Take by mouth.    . metoprolol succinate (TOPROL-XL) 25 MG 24 hr tablet Take 1 tablet (25 mg total) by mouth daily. 30 tablet 5  . PIMTREA 0.15-0.02/0.01 MG (21/5) tablet TAKE 1 TABLET BY MOUTH DAILY. 84 tablet 3  . rosuvastatin (CRESTOR) 40 MG tablet TAKE 1 TABLET BY MOUTH ONCE DAILY 90 tablet 3  . Zinc Acetate, Oral, (ZINC ACETATE PO) Take by mouth.    . NEOMYCIN-POLYMYXIN-HYDROCORTISONE (CORTISPORIN) 1 % SOLN OTIC solution Apply 1-2 drops to toe BID after soaking 10 mL 1   No facility-administered medications prior to visit.    Allergies  Allergen Reactions  . Retinoids Shortness Of Breath  .  Sudafed [Pseudoephedrine Hcl] Palpitations    Review of Systems  Constitutional: Negative for fever and malaise/fatigue.  HENT: Negative for congestion.   Eyes: Negative for blurred vision.  Respiratory: Negative for shortness of breath.   Cardiovascular: Negative for chest pain, palpitations and leg swelling.  Gastrointestinal: Negative for abdominal pain, blood in stool and nausea.  Genitourinary: Negative for dysuria and frequency.  Musculoskeletal: Negative for falls.  Skin: Negative for rash.  Neurological: Negative for dizziness, loss of consciousness and headaches.  Endo/Heme/Allergies: Negative for environmental allergies.  Psychiatric/Behavioral: Negative for depression. The patient is not nervous/anxious.        Objective:    Physical Exam Constitutional:      General: She is not in acute distress.    Appearance: She is well-developed.  HENT:     Head: Normocephalic and atraumatic.  Eyes:     Conjunctiva/sclera: Conjunctivae normal.  Neck:     Thyroid: No thyromegaly.  Cardiovascular:     Rate and Rhythm: Normal rate and regular rhythm.     Heart sounds: Normal heart sounds. No murmur.  Pulmonary:     Effort: Pulmonary effort is normal. No respiratory distress.     Breath sounds: Normal breath sounds.  Abdominal:     General: Bowel sounds are normal. There is no distension.     Palpations: Abdomen is soft. There is no mass.     Tenderness: There is no abdominal tenderness.  Musculoskeletal:     Cervical back: Neck supple.  Lymphadenopathy:     Cervical: No cervical adenopathy.  Skin:    General: Skin is warm and dry.  Neurological:     Mental Status: She is alert and oriented to person, place, and time.  Psychiatric:        Behavior: Behavior normal.     BP 120/80 (BP Location: Left Arm, Patient Position: Sitting, Cuff Size: Normal)   Pulse 90   Temp (!) 96.1 F (35.6 C) (Temporal)   Ht 5' 0.5" (1.537 m)   Wt 138 lb 4 oz (62.7 kg)   LMP 06/19/2019    SpO2 98%   BMI 26.56 kg/m  Wt Readings from Last 3 Encounters:  03/17/20 138 lb 4 oz (62.7 kg)  09/13/19 137 lb (62.1 kg)  09/04/19 140 lb (63.5 kg)    Diabetic Foot Exam - Simple   No data filed     Lab Results  Component Value Date   WBC 6.6 03/13/2020   HGB 13.6 03/13/2020   HCT 39.8 03/13/2020   PLT 267.0 03/13/2020   GLUCOSE 110 (H) 03/13/2020   CHOL  204 (H) 03/13/2020   TRIG 217.0 (H) 03/13/2020   HDL 51.50 03/13/2020   LDLDIRECT 103.0 03/13/2020   LDLCALC 73 08/10/2017   ALT 22 03/13/2020   AST 24 03/13/2020   NA 137 03/13/2020   K 3.8 03/13/2020   CL 103 03/13/2020   CREATININE 0.65 03/13/2020   BUN 15 03/13/2020   CO2 29 03/13/2020   TSH 1.87 03/13/2020   HGBA1C 6.2 03/13/2020    Lab Results  Component Value Date   TSH 1.87 03/13/2020   Lab Results  Component Value Date   WBC 6.6 03/13/2020   HGB 13.6 03/13/2020   HCT 39.8 03/13/2020   MCV 90.2 03/13/2020   PLT 267.0 03/13/2020   Lab Results  Component Value Date   NA 137 03/13/2020   K 3.8 03/13/2020   CO2 29 03/13/2020   GLUCOSE 110 (H) 03/13/2020   BUN 15 03/13/2020   CREATININE 0.65 03/13/2020   BILITOT 0.6 03/13/2020   ALKPHOS 74 03/13/2020   AST 24 03/13/2020   ALT 22 03/13/2020   PROT 6.9 03/13/2020   ALBUMIN 4.3 03/13/2020   CALCIUM 9.3 03/13/2020   GFR 97.24 03/13/2020   Lab Results  Component Value Date   CHOL 204 (H) 03/13/2020   Lab Results  Component Value Date   HDL 51.50 03/13/2020   Lab Results  Component Value Date   LDLCALC 73 08/10/2017   Lab Results  Component Value Date   TRIG 217.0 (H) 03/13/2020   Lab Results  Component Value Date   CHOLHDL 4 03/13/2020   Lab Results  Component Value Date   HGBA1C 6.2 03/13/2020       Assessment & Plan:   Problem List Items Addressed This Visit    Hyperlipidemia    Encouraged heart healthy diet, increase exercise, avoid trans fats, consider a krill oil cap daily. Tolerating Rosuvastatin      Relevant  Orders   Lipid panel   TSH   Vitamin D deficiency - Primary    Supplement and monitor      Relevant Orders   Comprehensive metabolic panel   TSH   VITAMIN D 25 Hydroxy (Vit-D Deficiency, Fractures)   Preventative health care    Patient encouraged to maintain heart healthy diet, regular exercise, adequate sleep. Consider daily probiotics. Take medications as prescribed. Referred to OB/GYN for pap at her request. Labs ordered and reviewed      Hyperglycemia    hgba1c acceptable, minimize simple carbs. Increase exercise as tolerated.       Relevant Orders   Hemoglobin A1c   Comprehensive metabolic panel   TSH   Acid reflux    Avoid offending foods, start probiotics. Do not eat large meals in late evening and consider raising head of bed. Started on Famotidine      Relevant Medications   famotidine (PEPCID) 40 MG tablet   Other Relevant Orders   CBC    Other Visit Diagnoses    Cervical cancer screening       Relevant Orders   Ambulatory referral to Obstetrics / Gynecology      I have discontinued Dinia Zimmerle. Osbourne's NEOMYCIN-POLYMYXIN-HYDROCORTISONE. I am also having her start on famotidine. Additionally, I am having her maintain her rosuvastatin, Pimtrea, metoprolol succinate, Ascorbic Acid (VITAMIN C PO), and (Zinc Acetate, Oral, (ZINC ACETATE PO)).  Meds ordered this encounter  Medications  . famotidine (PEPCID) 40 MG tablet    Sig: Take 1 tablet (40 mg total) by mouth at bedtime.  Dispense:  30 tablet    Refill:  6     Penni Homans, MD

## 2020-03-25 MED FILL — ROSUVASTATIN CALCIUM 40 MG: 40 | 90 days supply | Qty: 90 | Fill #2

## 2020-03-25 MED FILL — PANTOPRAZOLE SOD DR 40 MG T: 40 | 90 days supply | Qty: 90 | Fill #1

## 2020-04-10 ENCOUNTER — Other Ambulatory Visit: Payer: Self-pay | Admitting: Family Medicine

## 2020-04-11 ENCOUNTER — Other Ambulatory Visit: Payer: Self-pay | Admitting: Family Medicine

## 2020-04-11 MED FILL — METOPROLOL SUCCINATE ER 25: 25 | 60 days supply | Qty: 60 | Fill #0

## 2020-05-06 MED FILL — PIMTREA 28 DAY TABLET: 0.15-0.02/0 | 84 days supply | Qty: 84 | Fill #3

## 2020-06-17 MED FILL — METOPROLOL SUCCINATE ER 25: 25 | 60 days supply | Qty: 60 | Fill #1

## 2020-06-17 MED FILL — ROSUVASTATIN CALCIUM 40 MG: 40 | 90 days supply | Qty: 90 | Fill #3

## 2020-07-08 MED FILL — FLUARIX QUADRIVALENT 0.5 ML: 0.5 | 1 days supply | Qty: 1 | Fill #0

## 2020-07-08 MED FILL — FAMOTIDINE 40 MG TABLET: 40 | 30 days supply | Qty: 30 | Fill #1

## 2020-07-10 ENCOUNTER — Encounter: Payer: 59 | Admitting: Obstetrics & Gynecology

## 2020-07-28 ENCOUNTER — Other Ambulatory Visit: Payer: Self-pay | Admitting: Family Medicine

## 2020-07-28 MED FILL — PIMTREA 28 DAY TABLET: 0.15-0.02/0 | 84 days supply | Qty: 84 | Fill #0

## 2020-08-20 ENCOUNTER — Encounter: Payer: Self-pay | Admitting: Obstetrics & Gynecology

## 2020-08-20 ENCOUNTER — Other Ambulatory Visit (HOSPITAL_COMMUNITY)
Admission: RE | Admit: 2020-08-20 | Discharge: 2020-08-20 | Disposition: A | Discharge status: Home / Self Care | Payer: 59 | Source: Ambulatory Visit | Attending: Obstetrics & Gynecology | Admitting: Obstetrics & Gynecology

## 2020-08-20 ENCOUNTER — Ambulatory Visit (INDEPENDENT_AMBULATORY_CARE_PROVIDER_SITE_OTHER): Payer: 59 | Admitting: Obstetrics & Gynecology

## 2020-08-20 VITALS — BP 131/80 | HR 72 | Ht 60.0 in | Wt 138.1 lb

## 2020-08-20 DIAGNOSIS — Z01419 Encounter for gynecological examination (general) (routine) without abnormal findings: Secondary | ICD-10-CM | POA: Insufficient documentation

## 2020-08-20 DIAGNOSIS — Z3009 Encounter for other general counseling and advice on contraception: Secondary | ICD-10-CM | POA: Diagnosis not present

## 2020-08-20 DIAGNOSIS — Z1231 Encounter for screening mammogram for malignant neoplasm of breast: Secondary | ICD-10-CM | POA: Diagnosis not present

## 2020-08-20 MED ORDER — DESOGESTREL-ETHINYL ESTRADIOL 0.15-0.02/0.01 MG (21/5) PO TABS: 1.0000 | ORAL_TABLET | Freq: Every day | ORAL | 3 refills | Status: DC

## 2020-08-20 MED FILL — METOPROLOL SUCCINATE ER 25: 25 | 60 days supply | Qty: 60 | Fill #2

## 2020-08-20 NOTE — Progress Notes (Signed)
Subjective:     Vickie Thompson is a 49 y.o. female here for a routine exam. G2P2 s/p SVD x2. Current complaints: none. Occ skipped menses. On OCPs. Some mood changes. No hot flushes. Sx mild at present. No FH of any female malignancies. Married. 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    Gynecologic History Patient's last menstrual period was 06/18/2020 (approximate). Contraception: OCP (estrogen/progesterone) Last Pap: 01/05/2016. Results were: normal Last mammogram: 06/26/2019. Results were: normal  Obstetric History OB History  Gravida Para Term Preterm AB Living  2 2 2         SAB TAB Ectopic Multiple Live Births               # Outcome Date GA Lbr Len/2nd Weight Sex Delivery Anes PTL Lv  2 Term 02/24/07     Vag-Spont        Birth Comments: GDM  1 Term 12/16/97     Vag-Spont        The following portions of the patient's history were reviewed and updated as appropriate: allergies, current medications, past family history, past medical history, past social history, past surgical history and problem list.  Review of Systems Pertinent items are noted in HPI.    Objective:  BP 131/80   Pulse 72   Ht 5' (1.524 m)   Wt 138 lb 1.9 oz (62.7 kg)   LMP 06/18/2020 (Approximate)   BMI 26.97 kg/m  General Appearance:    Alert, cooperative, no distress, appears stated age  Head:    Normocephalic, without obvious abnormality, atraumatic  Eyes:    conjunctiva/corneas clear, EOM's intact, both eyes  Ears:    Normal external ear canals, both ears  Nose:   Nares normal, septum midline, mucosa normal, no drainage    or sinus tenderness  Throat:   Lips, mucosa, and tongue normal; teeth and gums normal  Neck:   Supple, symmetrical, trachea midline, no adenopathy;    thyroid:  no enlargement/tenderness/nodules  Back:     Symmetric, no curvature, ROM normal, no CVA tenderness  Lungs:     respirations unlabored  Chest Wall:    No tenderness or deformity   Heart:    Regular rate and rhythm  Breast Exam:     No tenderness, masses, or nipple abnormality; inverted nipple on left side.    Abdomen:     Soft, non-tender, bowel sounds active all four quadrants,    no masses, no organomegaly  Genitalia:    Normal female without lesion, discharge or tenderness   Uterus small mobile. No adnexal masses.    Extremities:   Extremities normal, atraumatic, no cyanosis or edema  Pulses:   2+ and symmetric all extremities  Skin:   Skin color, texture, turgor normal, no rashes or lesions     Assessment:    Healthy female exam.   Contraception counseling Perimenopausal sx. Mild. Pt does not desire management at present.    Plan:  Vickie Thompson was seen today for gynecologic exam.  Diagnoses and all orders for this visit:  Well female exam with routine gynecological exam -     Cytology - PAP( [place]) -     MM DIGITAL SCREENING BILATERAL; Future  Encounter for counseling regarding contraception -     desogestrel-ethinyl estradiol (PIMTREA) 0.15-0.02/0.01 MG (21/5) tablet; Take 1 tablet by mouth daily.  Encounter for screening mammogram for malignant neoplasm of breast -     MM DIGITAL SCREENING BILATERAL; Future  f/u in 1 year  or sooner prn  Joe Tanney L. Harraway-Smith, M.D., Cherlynn June

## 2020-08-22 LAB — CYTOLOGY - PAP
Chlamydia: NEGATIVE
Comment: NEGATIVE
Comment: NEGATIVE
Comment: NEGATIVE
Comment: NORMAL
Diagnosis: NEGATIVE
High risk HPV: NEGATIVE
Neisseria Gonorrhea: NEGATIVE
Trichomonas: NEGATIVE

## 2020-09-01 ENCOUNTER — Other Ambulatory Visit: Payer: Self-pay | Admitting: Family Medicine

## 2020-09-01 MED FILL — FAMOTIDINE 40 MG TABLET: 40 | 30 days supply | Qty: 30 | Fill #2

## 2020-09-22 ENCOUNTER — Other Ambulatory Visit: Payer: 59

## 2020-09-22 ENCOUNTER — Other Ambulatory Visit: Payer: Self-pay | Admitting: Family Medicine

## 2020-09-23 ENCOUNTER — Other Ambulatory Visit: Payer: Self-pay | Admitting: Family Medicine

## 2020-09-23 MED FILL — ROSUVASTATIN CALCIUM 40 MG: 40 | 90 days supply | Qty: 90 | Fill #0

## 2020-09-29 ENCOUNTER — Other Ambulatory Visit: Payer: Self-pay | Admitting: Obstetrics & Gynecology

## 2020-09-29 DIAGNOSIS — Z1231 Encounter for screening mammogram for malignant neoplasm of breast: Secondary | ICD-10-CM

## 2020-09-29 DIAGNOSIS — Z01419 Encounter for gynecological examination (general) (routine) without abnormal findings: Secondary | ICD-10-CM

## 2020-10-02 ENCOUNTER — Ambulatory Visit: Payer: 59 | Admitting: Family Medicine

## 2020-10-03 ENCOUNTER — Other Ambulatory Visit: Payer: Self-pay

## 2020-10-03 ENCOUNTER — Ambulatory Visit
Admission: RE | Admit: 2020-10-03 | Discharge: 2020-10-03 | Disposition: A | Discharge status: Home / Self Care | Payer: 59 | Source: Ambulatory Visit | Attending: Obstetrics & Gynecology | Admitting: Obstetrics & Gynecology

## 2020-10-03 DIAGNOSIS — Z1231 Encounter for screening mammogram for malignant neoplasm of breast: Secondary | ICD-10-CM

## 2020-10-13 ENCOUNTER — Other Ambulatory Visit: Payer: Self-pay | Admitting: Obstetrics & Gynecology

## 2020-10-13 DIAGNOSIS — R928 Other abnormal and inconclusive findings on diagnostic imaging of breast: Secondary | ICD-10-CM

## 2020-10-24 MED FILL — PIMTREA 28 DAY TABLET: 0.15-0.02/0 | 84 days supply | Qty: 84 | Fill #1

## 2020-10-28 ENCOUNTER — Other Ambulatory Visit: Payer: 59

## 2020-10-30 MED FILL — METOPROLOL SUCCINATE ER 25: 25 | 60 days supply | Qty: 60 | Fill #3

## 2020-11-03 MED FILL — FAMOTIDINE 40 MG TABLET: 40 | 30 days supply | Qty: 30 | Fill #3

## 2020-12-16 ENCOUNTER — Other Ambulatory Visit: Payer: Self-pay

## 2020-12-16 ENCOUNTER — Ambulatory Visit: Payer: 59

## 2020-12-16 ENCOUNTER — Ambulatory Visit
Admission: RE | Admit: 2020-12-16 | Discharge: 2020-12-16 | Disposition: A | Discharge status: Home / Self Care | Payer: 59 | Source: Ambulatory Visit | Attending: Obstetrics & Gynecology | Admitting: Obstetrics & Gynecology

## 2020-12-16 DIAGNOSIS — R928 Other abnormal and inconclusive findings on diagnostic imaging of breast: Secondary | ICD-10-CM | POA: Diagnosis not present

## 2020-12-16 MED FILL — FAMOTIDINE 40 MG TABLET: 40 | 30 days supply | Qty: 30 | Fill #4

## 2020-12-31 MED FILL — METOPROLOL SUCCINATE ER 25: 25 | 60 days supply | Qty: 60 | Fill #4

## 2021-01-07 ENCOUNTER — Other Ambulatory Visit: Payer: Self-pay | Admitting: Family Medicine

## 2021-01-08 ENCOUNTER — Other Ambulatory Visit: Payer: Self-pay | Admitting: Family Medicine

## 2021-01-12 MED FILL — PIMTREA 28 DAY TABLET: 0.15-0.02/0 | 84 days supply | Qty: 84 | Fill #2

## 2021-01-24 ENCOUNTER — Other Ambulatory Visit (HOSPITAL_BASED_OUTPATIENT_CLINIC_OR_DEPARTMENT_OTHER): Payer: Self-pay

## 2021-02-11 DIAGNOSIS — Z135 Encounter for screening for eye and ear disorders: Secondary | ICD-10-CM | POA: Diagnosis not present

## 2021-02-11 DIAGNOSIS — H524 Presbyopia: Secondary | ICD-10-CM | POA: Diagnosis not present

## 2021-02-23 ENCOUNTER — Telehealth: Payer: 59 | Admitting: Family Medicine

## 2021-02-23 ENCOUNTER — Other Ambulatory Visit: Payer: Self-pay

## 2021-03-05 ENCOUNTER — Other Ambulatory Visit (HOSPITAL_BASED_OUTPATIENT_CLINIC_OR_DEPARTMENT_OTHER): Payer: Self-pay

## 2021-03-05 MED FILL — Metoprolol Succinate Tab ER 24HR 25 MG (Tartrate Equiv): ORAL | 60 days supply | Qty: 60 | Fill #0 | Status: AC

## 2021-03-05 MED FILL — Famotidine Tab 40 MG: ORAL | 30 days supply | Qty: 30 | Fill #0 | Status: AC

## 2021-03-15 ENCOUNTER — Other Ambulatory Visit: Payer: Self-pay

## 2021-03-15 ENCOUNTER — Emergency Department
Admission: EM | Admit: 2021-03-15 | Discharge: 2021-03-15 | Disposition: A | Discharge status: Home / Self Care | Payer: 59 | Source: Home / Self Care

## 2021-03-15 DIAGNOSIS — K649 Unspecified hemorrhoids: Secondary | ICD-10-CM | POA: Diagnosis not present

## 2021-03-15 DIAGNOSIS — N3000 Acute cystitis without hematuria: Secondary | ICD-10-CM | POA: Diagnosis not present

## 2021-03-15 LAB — POCT URINALYSIS DIP (MANUAL ENTRY)
Bilirubin, UA: NEGATIVE
Glucose, UA: NEGATIVE mg/dL
Ketones, POC UA: NEGATIVE mg/dL
Nitrite, UA: NEGATIVE
Protein Ur, POC: NEGATIVE mg/dL
Spec Grav, UA: 1.015 (ref 1.010–1.025)
Urobilinogen, UA: 0.2 E.U./dL
pH, UA: 7 (ref 5.0–8.0)

## 2021-03-15 MED ORDER — HYDROCORTISONE ACETATE 25 MG RE SUPP
25.0000 mg | Freq: Two times a day (BID) | RECTAL | 0 refills | Status: DC
Start: 2021-03-15 — End: 2021-04-02

## 2021-03-15 MED ORDER — NITROFURANTOIN MONOHYD MACRO 100 MG PO CAPS: 100.0000 mg | ORAL_CAPSULE | Freq: Two times a day (BID) | ORAL | 0 refills | Status: DC

## 2021-03-15 NOTE — ED Triage Notes (Signed)
Pt presents to Urgent Care with c/o abdominal bloating and hemorrhoids. 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 Denies n/v.

## 2021-03-15 NOTE — Discharge Instructions (Signed)
Take antibiotics as prescribed for UTI. We are sending urine to the lab for culture and will adjust antibiotics if needed after results.  Use suppositories as prescribed for hemorrhoids. Continue to keep bowel movements soft but avoid diarrhea if possible. Make sure are is clean but avoid excessive cleaning or scrubbing that can increase irritation. Follow up with GI if no improvement.

## 2021-03-15 NOTE — ED Provider Notes (Signed)
[facility]    CSN: [id number] Arrival date & time: 03/15/21  0903      History   Chief Complaint Chief Complaint  Patient presents with  . Hemorrhoids  . Bloated  . Urinary Urgency    HPI Vickie Thompson is a 50 y.o. female.   Patient presents today with two concerns.  She reports yesterday she developed lower abdominal bloating and pressure, urinary urgency and frequency. 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 She denies any pain with urination, hematuria, nausea/vomiting, back pain, or fever.  She also reports concerns for hemorrhoids. She states she has had hemorrhoids for a while, previously seen by GI and had banding done. Over the past week or so they have become more uncomfortable. She has been taking magnesium every night which has caused loose stools to the point of diarrhea. She has also been taking ibuprofen for the discomfort with minimal improvement. She has not tried any hemorrhoid cream. She states she uses a bidet to clean after using the bathroom. She denies any blood in the stool or recent constipation.   The history is provided by the patient.    Past Medical History:  Diagnosis Date  . Contraceptive management 07/13/2015  . Contraceptive management 07/13/2015  . Contraceptive management 07/13/2015   Menarche at 17 Regular and moderate flow No history of abnormal pap in past G2P2, s/p 2 SVD No history of abnormal MGM No concerns today No gyn surgeries LMP 12/15/2015, normal, 5 days   . GERD (gastroesophageal reflux disease)   . H/O gestational diabetes mellitus, not currently pregnant   . Hemorrhoid   . Hyperglycemia 05/03/2017  . Hyperlipidemia   . Preventative health care 01/05/2016    Patient Active Problem List   Diagnosis Date Noted  . Insomnia 09/16/2019  . Acid reflux 09/16/2019  . Left wrist pain 08/19/2019  . Right elbow pain 08/19/2019  . Rectal bleeding  06/04/2018  . Knee pain 06/04/2018  . Hyperglycemia 05/03/2017  . Lateral epicondylitis, left elbow 01/12/2017  . Preventative health care 01/05/2016  . Contraceptive management 07/13/2015  . Vitamin D deficiency 02/20/2012  . Hyperlipidemia   . H/O gestational diabetes mellitus, not currently pregnant     History reviewed. No pertinent surgical history.  OB History    Gravida  2   Para  2   Term  2   Preterm      AB      Living        SAB      IAB      Ectopic      Multiple      Live Births               Home Medications    Prior to Admission medications   Medication Sig Start Date End Date Taking? Authorizing Provider  hydrocortisone (ANUSOL-HC) 25 MG suppository Place 1 suppository (25 mg total) rectally 2 (two) times daily. 03/15/21  Yes Saphyra Hutt L, PA  ibuprofen (ADVIL) 800 MG tablet Take 800 mg by mouth 2 (two) times daily as needed.   Yes [provider]  nitrofurantoin, macrocrystal-monohydrate, (MACROBID) 100 MG capsule Take 1 capsule (100 mg total) by mouth 2 (two) times daily. 03/15/21  Yes Danise Dehne L, PA  OVER THE COUNTER MEDICATION Magnesium powder nightly   Yes [provider]  Ascorbic Acid (VITAMIN C PO) Take by mouth.  [provider]  desogestrel-ethinyl estradiol (MIRCETTE) 0.15-0.02/0.01 MG (21/5) tablet TAKE 1 TABLET BY MOUTH DAILY. 07/28/20 07/28/21  Mosie Lukes, MD  desogestrel-ethinyl estradiol (PIMTREA) 0.15-0.02/0.01 MG (21/5) tablet Take 1 tablet by mouth daily. 08/20/20   Lavonia Drafts, MD  famotidine (PEPCID) 40 MG tablet TAKE 1 TABLET (40 MG TOTAL) BY MOUTH AT BEDTIME. 03/17/20 04/04/21  Mosie Lukes, MD  metoprolol succinate (TOPROL-XL) 25 MG 24 hr tablet TAKE 1 TABLET BY MOUTH ONCE DAILY 04/11/20 05/04/21  Mosie Lukes, MD  rosuvastatin (CRESTOR) 40 MG tablet TAKE 1 TABLET BY MOUTH ONCE DAILY 01/08/21 01/08/22  Mosie Lukes, MD  Zinc Acetate, Oral, (ZINC ACETATE PO) Take by mouth.     [provider]    Family History Family History  Problem Relation Age of Onset  . Diabetes Father   . Hypertension Father   . Mental illness Mother        depression  . Hypertension Sister   . Gout Brother   . Cancer Maternal Aunt        lung  . Colon cancer Neg Hx   . Esophageal cancer Neg Hx   . Rectal cancer Neg Hx   . Stomach cancer Neg Hx     Social History Social History   Tobacco Use  . Smoking status: Never Smoker  . Smokeless tobacco: Never Used  Vaping Use  . Vaping Use: Never used  Substance Use Topics  . Alcohol use: Yes    Comment: occasionally  . Drug use: No     Allergies   Retinoids and Sudafed [pseudoephedrine hcl]   Review of Systems Review of Systems  Constitutional: Negative for fatigue and fever.  Respiratory: Negative for cough and shortness of breath.   Gastrointestinal: Positive for abdominal pain and diarrhea. Negative for anal bleeding, blood in stool, constipation, nausea and vomiting.  Genitourinary: Positive for frequency and urgency. Negative for difficulty urinating, dysuria, flank pain and vaginal discharge.  Musculoskeletal: Negative for back pain.  Neurological: Negative for dizziness and headaches.     Physical Exam Triage Vital Signs ED Triage Vitals  Enc Vitals Group     BP 03/15/21 0949 128/85     Pulse Rate 03/15/21 0949 87     Resp 03/15/21 0949 20     Temp 03/15/21 0949 98.4 F (36.9 C)     Temp Source 03/15/21 0949 Oral     SpO2 03/15/21 0949 100 %     Weight 03/15/21 0944 127 lb 8 oz (57.8 kg)     Height 03/15/21 0944 5' 0.5" (1.537 m)     Head Circumference --      Peak Flow --      Pain Score 03/15/21 0944 5     Pain Loc --      Pain Edu? --      Excl. in [place]? --    No data found.  Updated Vital Signs BP 128/85 (BP Location: Right Arm)   Pulse 87   Temp 98.4 F (36.9 C) (Oral)   Resp 20   Ht 5' 0.5" (1.537 m)   Wt 127 lb 8 oz (57.8 kg)   LMP 01/21/2021 (Approximate) Comment:  perimenopausal   SpO2 100%   BMI 24.49 kg/m   Visual Acuity Right Eye Distance:   Left Eye Distance:   Bilateral Distance:    Right Eye Near:   Left Eye Near:    Bilateral Near:     Physical Exam Vitals and nursing  note reviewed.  Constitutional:      General: She is not in acute distress. HENT:     Head: Normocephalic.  Eyes:     Pupils: Pupils are equal, round, and reactive to light.  Cardiovascular:     Rate and Rhythm: Normal rate and regular rhythm.     Heart sounds: Normal heart sounds.  Pulmonary:     Effort: Pulmonary effort is normal.     Breath sounds: Normal breath sounds.  Abdominal:     General: Bowel sounds are normal.     Palpations: Abdomen is soft.     Tenderness: There is abdominal tenderness in the suprapubic area. There is no right CVA tenderness, left CVA tenderness, guarding or rebound.  Genitourinary:    Rectum: Tenderness and internal hemorrhoid present. No anal fissure or external hemorrhoid.     Comments: No visible external hemorrhoids. Mild tenderness around anus.  Skin:    Findings: No rash.  Neurological:     Mental Status: She is alert.  Psychiatric:        Mood and Affect: Mood normal.      UC Treatments / Results  Labs (all labs ordered are listed, but only abnormal results are displayed) Labs Reviewed  POCT URINALYSIS DIP (MANUAL ENTRY) - Abnormal; Notable for the following components:      Result Value   Blood, UA trace-lysed (*)    Leukocytes, UA Trace (*)    All other components within normal limits  URINE CULTURE    EKG   Radiology No results found.  Procedures Procedures (including critical care time)  Medications Ordered in UC Medications - No data to display  Initial Impression / Assessment and Plan / UC Course  I have reviewed the triage vital signs and the nursing notes.  Pertinent labs & imaging results that were available during my care of the patient were reviewed by me and considered in my medical  decision making (see chart for details).     Urgency and mild retention along with abdominal pressure likely secondary to UTI, abnl UA in clinic. Empiric abx while awaiting culture.  Chronic hemorrhoids with recent flare. Discussed keeping stool soft but not too loose to causing additional irritation. Suspect UTI may be secondary to recent diarrhea. Steroid suppository with GI f/u  E/M: 2 acute uncomplicated illnesses, 2 tests (UA, UCx) moderate risk due to prescription management  Final Clinical Impressions(s) / UC Diagnoses   Final diagnoses:  Acute cystitis without hematuria  Hemorrhoids, unspecified hemorrhoid type     Discharge Instructions     Take antibiotics as prescribed for UTI. We are sending urine to the lab for culture and will adjust antibiotics if needed after results.  Use suppositories as prescribed for hemorrhoids. Continue to keep bowel movements soft but avoid diarrhea if possible. Make sure are is clean but avoid excessive cleaning or scrubbing that can increase irritation. Follow up with GI if no improvement.     ED Prescriptions    Medication Sig Dispense Auth. Provider   nitrofurantoin, macrocrystal-monohydrate, (MACROBID) 100 MG capsule Take 1 capsule (100 mg total) by mouth 2 (two) times daily. 10 capsule Abner Greenspan, Haig Gerardo L, PA   hydrocortisone (ANUSOL-HC) 25 MG suppository Place 1 suppository (25 mg total) rectally 2 (two) times daily. 12 suppository Abner Greenspan, Dominyck Reser L, PA     PDMP not reviewed this encounter.   Delsa Sale, [place] 03/15/21 1024

## 2021-03-16 ENCOUNTER — Telehealth: Payer: Self-pay | Admitting: *Deleted

## 2021-03-16 NOTE — Telephone Encounter (Signed)
They gave her Macrobid, let her know if she does not fully recover we will need a UA and Culture run to decide what to treat with next.

## 2021-03-16 NOTE — Telephone Encounter (Signed)
Just FYI She went to ER yesterday.  Message was taken on 03/14/21.

## 2021-03-16 NOTE — Telephone Encounter (Signed)
Who Is Calling Patient / Member / Family / Caregiver Call Type Triage / Clinical Relationship To Patient Self Return Phone Number [phone] (Primary) Chief Complaint Urinary Retention (> THREE MONTHS) Reason for Call Request to Speak to a Physician Initial Comment Caller states that she would like to speak with Dr. Charlett Blake and she states that she is having fluid retention. Caller would like something to go to the restroom Translation No Nurse Assessment Nurse: Malena Peer, RN, Edwena Felty Date/Time (Eastern Time): 03/14/2021 3:46:53 PM Confirm and document reason for call. If symptomatic, describe symptoms. ---Caller states she has been on the [employer] and states she is to take Magnesium which she has been doing. She states she has also been taking Tylenol 1500mg  per day. The last time she urinated was at 2:45 this afternoon. She had a bladder scanner available and states she still had 371ml urine in bladder. She wants to know if there is something else she can take to help with the urinary retention. She feels like she is bloated but is unable to do any more than dribbles. She has been on the diet for approx 2 months.

## 2021-03-17 LAB — URINE CULTURE
MICRO NUMBER:: 11921452
Result:: NO GROWTH
SPECIMEN QUALITY:: ADEQUATE

## 2021-03-17 NOTE — Telephone Encounter (Signed)
LVM to call back.

## 2021-03-19 NOTE — Telephone Encounter (Signed)
Left detailed message that we were calling to check on status, but if she is not doing well to give [place] a call back.

## 2021-04-02 ENCOUNTER — Encounter: Payer: Self-pay | Admitting: Gastroenterology

## 2021-04-02 ENCOUNTER — Other Ambulatory Visit: Payer: Self-pay

## 2021-04-02 ENCOUNTER — Ambulatory Visit (INDEPENDENT_AMBULATORY_CARE_PROVIDER_SITE_OTHER): Payer: 59 | Admitting: Gastroenterology

## 2021-04-02 VITALS — BP 110/72 | HR 91 | Ht 60.5 in | Wt 131.4 lb

## 2021-04-02 DIAGNOSIS — K5900 Constipation, unspecified: Secondary | ICD-10-CM | POA: Diagnosis not present

## 2021-04-02 DIAGNOSIS — K641 Second degree hemorrhoids: Secondary | ICD-10-CM | POA: Diagnosis not present

## 2021-04-02 NOTE — Patient Instructions (Signed)
If you are age 49 or older, your body mass index should be between 23-30. Your Body mass index is 25.24 kg/m. If this is out of the aforementioned range listed, please consider follow up with your Primary Care Provider.  If you are age 85 or younger, your body mass index should be between 19-25. Your Body mass index is 25.24 kg/m. If this is out of the aformentioned range listed, please consider follow up with your Primary Care Provider.   __________________________________________________________  The [place] [facility] providers would like to encourage you to use [facility] to communicate with providers for non-urgent requests or questions.  Due to long hold times on the telephone, sending your provider a message by [facility] may be a faster and more efficient way to get a response.  Please allow 48 business hours for a response.  Please remember that this is for non-urgent requests.   -------------------------------------------------------------------------------------------------------  HEMORRHOID BANDING PROCEDURE    FOLLOW-UP CARE   The procedure you have had should have been relatively painless since the banding of the area involved does not have nerve endings and there is no pain sensation.  The rubber band cuts off the blood supply to the hemorrhoid and the band may fall off as soon as 48 hours after the banding (the band may occasionally be seen in the toilet bowl following a bowel movement). You may notice a temporary feeling of fullness in the rectum which should respond adequately to plain Tylenol or Motrin.  Following the banding, avoid strenuous exercise that evening and resume full activity the next day.  A sitz bath (soaking in a warm tub) or bidet is soothing, and can be useful for cleansing the area after bowel movements.     To avoid constipation, take two tablespoons of natural wheat bran, natural oat bran, flax, Benefiber or any over the counter fiber supplement and increase your  water intake to 7-8 glasses daily.    Unless you have been prescribed anorectal medication, do not put anything inside your rectum for two weeks: No suppositories, enemas, fingers, etc.  Occasionally, you may have more bleeding than usual after the banding procedure.  This is often from the untreated hemorrhoids rather than the treated one.  Don't be concerned if there is a tablespoon or so of blood.  If there is more blood than this, lie flat with your bottom higher than your head and apply an ice pack to the area. If the bleeding does not stop within a half an hour or if you feel faint, call our office at [phone] or go to the emergency room.  Problems are not common; however, if there is a substantial amount of bleeding, severe pain, chills, fever or difficulty passing urine (very rare) or other problems, you should call us at [phone] or report to the nearest emergency room.  Do not stay seated continuously for more than 2-3 hours for a day or two after the procedure.  Tighten your buttock muscles 10-15 times every two hours and take 10-15 deep breaths every 1-2 hours.  Do not spend more than a few minutes on the toilet if you cannot empty your bowel; instead re-visit the toilet at a later time.    ___________________________________________________________  Dennis Bast have been scheduled to have an anorectal manometry at [facility] on ______ at _______. Please arrive 30 minutes prior to your appointment time for registration (1st floor of the hospital-admissions).  Please make certain to use 1 Fleets enema 2  hours prior to coming for your appointment. You can purchase Fleets enemas from the laxative section at your drug store. You should not eat anything during the two hours prior to the procedure. You may take regular medications with small sips of water at least 2 hours prior to the study.  Anorectal manometry is a test performed to evaluate patients with constipation or  fecal incontinence. This test measures the pressures of the anal sphincter muscles, the sensation in the rectum, and the neural reflexes that are needed for normal bowel movements.  THE PROCEDURE The test takes approximately 30 minutes to 1 hour. You will be asked to change into a hospital gown. A technician or nurse will explain the procedure to you, take a brief health history, and answer any questions you may have. The patient then lies on his or her left side. A small, flexible tube, about the size of a thermometer, with a balloon at the end is inserted into the rectum. The catheter is connected to a machine that measures the pressure. During the test, the small balloon attached to the catheter may be inflated in the rectum to assess the normal reflex pathways. The nurse or technician may also ask the person to squeeze, relax, and push at various times. The anal sphincter muscle pressures are measured during each of these maneuvers. To squeeze, the patient tightens the sphincter muscles as if trying to prevent anything from coming out. To push or bear down, the patient strains down as if trying to have a bowel movement.  ___________________________________________________________  Due to recent changes in healthcare laws, you may see the results of your imaging and laboratory studies on MyChart before your provider has had a chance to review them.  We understand that in some cases there may be results that are confusing or concerning to you. Not all laboratory results come back in the same time frame and the provider may be waiting for multiple results in order to interpret others.  Please give [place] 48 hours in order for your provider to thoroughly review all the results before contacting the office for clarification of your results.   Thank you for choosing me and [facility].  Vito Cirigliano, D.O.

## 2021-04-02 NOTE — Progress Notes (Signed)
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  - 08/25/18: Banded RA hemorrhoid - 09/19/18: Banded LL hemorrhoid - 10/04/18: Banded RP hemorrhoid  She had clinical improvement after completion of hemorrhoid banding series.   Endoscopic history: -Colonoscopy (07/28/2018, Dr. Bryan Lemma): 3 mm sessile serrated polyp in the cecum, 1 mm polyp in the rectum, medium-size internal hemorrhoids, normal terminal ileum.  Recommended repeat in 5 years surveillance.  HPI:     Patient is a 50 y.o. femalewith a history of symptomatic internal hemorrhoids presenting to the [facility]astroenterology Clinic for follow-up and ongoing treatment.  Last seen by me on 10/04/2018.  Had significant clinical improvement after completion of hemorrhoid banding series in 2019, but presents today with recurrence of hemorrhoidal symptoms described as intermittent episodes of hemorrhoidal prolapse, particularly with constipation/straining.   She also has recurrence of constipation, which is generally well controlled with MiraLAX daily and stool softener (Colace).  Does have episodes of loose stools.  Overall, feels like she is "pushing against a brick wall".   Review of systems:     No chest pain, no SOB, no fevers, no urinary sx   Past Medical History:  Diagnosis Date   Contraceptive management 07/13/2015   Contraceptive management 07/13/2015   Contraceptive management 07/13/2015   Menarche at 17 Regular and moderate flow No history of abnormal pap in past G2P2, s/p 2 SVD No history of abnormal MGM No concerns today No gyn surgeries LMP 12/15/2015, normal, 5 days    GERD (gastroesophageal reflux disease)    H/O gestational diabetes mellitus, not currently pregnant    Hemorrhoid    Hyperglycemia 05/03/2017   Hyperlipidemia     Preventative health care 01/05/2016    Patient's surgical history, family medical history, social history, medications and allergies were all reviewed in Epic    Current Outpatient Medications  Medication Sig Dispense Refill   Ascorbic Acid (VITAMIN C PO) Take by mouth.     desogestrel-ethinyl estradiol (MIRCETTE) 0.15-0.02/0.01 MG (21/5) tablet TAKE 1 TABLET BY MOUTH DAILY. 84 tablet 3   desogestrel-ethinyl estradiol (PIMTREA) 0.15-0.02/0.01 MG (21/5) tablet Take 1 tablet by mouth daily. 84 tablet 3   famotidine (PEPCID) 40 MG tablet TAKE 1 TABLET (40 MG TOTAL) BY MOUTH AT BEDTIME. 30 tablet 6   ibuprofen (ADVIL) 800 MG tablet Take 800 mg by mouth 2 (two) times daily as needed.     metoprolol succinate (TOPROL-XL) 25 MG 24 hr tablet TAKE 1 TABLET BY MOUTH ONCE DAILY 60 tablet 5   OVER THE COUNTER MEDICATION Magnesium powder nightly     rosuvastatin (CRESTOR) 40 MG tablet TAKE 1 TABLET BY MOUTH ONCE DAILY 90 tablet 0   Zinc Acetate, Oral, (ZINC ACETATE PO) Take by mouth.     No current facility-administered medications for this visit.    Physical Exam:     BP 110/72   Pulse 91   Ht 5' 0.5" (1.537 m)   Wt 131 lb 6 oz (59.6 kg)   SpO2 98%   BMI 25.24 kg/m   GENERAL:  Pleasant female in NAD PSYCH: : Cooperative, normal affect NEURO: Alert and oriented x 3, no focal neurologic deficits Rectal exam: Sensation intact and preserved anal wink.  No external anal fissures noted. Normal sphincter tone. No palpable mass. No blood on the exam glove.  Anoscopy with single hemorrhoidal column  that then prolapsed out with pushing/straining maneuvers.  This was easily reducible.  (Chaperone: Curlene Labrum, CMA).   IMPRESSION and PLAN:    #1.  Symptomatic internal hemorrhoids: PROCEDURE NOTE: The patient presents with symptomatic grade 2 hemorrhoids, unresponsive to maximal medical therapy, requesting rubber band ligation of symptomatic hemorrhoidal disease.  All risks, benefits and alternative  forms of therapy were described and informed consent was obtained.  In the Left Lateral Decubitus position, anoscopic examination revealed grade 2 hemorrhoids in the RP position(s).  The anorectum was pre-medicated with RectiCare. The decision was made to band the RP internal hemorrhoid, and the Union was used to perform band ligation without complication.  Digital anorectal examination was then performed to assure proper positioning of the band, and to adjust the banded tissue as required.  The patient was discharged home without pain or other issues.  Dietary and behavioral recommendations were given and along with follow-up instructions.     No complications were encountered and the patient tolerated the procedure well.  If return of hemorrhoidal symptoms, possibly refer to Colorectal Surgery for additional intervention options.   #2.  Chronic constipation - Symptoms suspicious for pelvic floor dyssynergia.  Will place referral for anorectal manometry - Resume adequate fluid intake and high-fiber diet - If ARM unrevealing, plan to continue with MiraLAX.  Also discussed changing to Amitiza or Linzess depending on clinical response  #3  History of colon polyps - Repeat colonoscopy in 2024 for ongoing polyp surveillance  RTC after ARM  I spent an additional 20 minutes of nonprocedural time, including independent review of results as outlined above, communicating results with the patient directly, face-to-face time with the patient, coordinating care, ordering studies and medications as appropriate, and documentation.         [place] ,DO, FACG 04/02/2021, 8:59 AM

## 2021-04-09 ENCOUNTER — Other Ambulatory Visit (HOSPITAL_BASED_OUTPATIENT_CLINIC_OR_DEPARTMENT_OTHER): Payer: Self-pay

## 2021-04-09 MED FILL — Desogest-Eth Estrad & Eth Estrad Tab 0.15-0.02/0.01 MG(21/5): ORAL | 84 days supply | Qty: 84 | Fill #0 | Status: AC

## 2021-04-22 ENCOUNTER — Ambulatory Visit (HOSPITAL_COMMUNITY): Admission: RE | Admit: 2021-04-22 | Payer: 59 | Source: Home / Self Care | Admitting: Gastroenterology

## 2021-04-22 ENCOUNTER — Encounter (HOSPITAL_COMMUNITY): Admission: RE | Payer: Self-pay | Source: Home / Self Care

## 2021-04-22 SURGERY — MANOMETRY, ANORECTAL

## 2021-05-03 ENCOUNTER — Other Ambulatory Visit: Payer: Self-pay | Admitting: Family Medicine

## 2021-05-04 ENCOUNTER — Other Ambulatory Visit (HOSPITAL_BASED_OUTPATIENT_CLINIC_OR_DEPARTMENT_OTHER): Payer: Self-pay

## 2021-05-04 MED ORDER — ROSUVASTATIN CALCIUM 40 MG PO TABS
40.0000 mg | ORAL_TABLET | Freq: Every day | ORAL | 0 refills | Status: DC
  Filled 2021-05-04: qty 90, 90d supply, fill #0

## 2021-05-04 MED ORDER — FAMOTIDINE 40 MG PO TABS
40.0000 mg | ORAL_TABLET | Freq: Every day | ORAL | 0 refills | Status: DC
  Filled 2021-05-04: qty 90, 90d supply, fill #0

## 2021-05-27 ENCOUNTER — Other Ambulatory Visit: Payer: Self-pay | Admitting: Family Medicine

## 2021-05-27 ENCOUNTER — Other Ambulatory Visit (HOSPITAL_BASED_OUTPATIENT_CLINIC_OR_DEPARTMENT_OTHER): Payer: Self-pay

## 2021-05-27 MED ORDER — METOPROLOL SUCCINATE ER 25 MG PO TB24
ORAL_TABLET | Freq: Every day | ORAL | 5 refills | Status: DC
  Filled 2021-05-27: qty 90, 90d supply, fill #0
  Filled 2021-09-02: qty 90, 90d supply, fill #1
  Filled 2021-12-14: qty 90, 90d supply, fill #2
  Filled 2022-03-24: qty 90, 90d supply, fill #3

## 2021-06-02 ENCOUNTER — Telehealth (INDEPENDENT_AMBULATORY_CARE_PROVIDER_SITE_OTHER): Payer: 59 | Admitting: Family Medicine

## 2021-06-02 ENCOUNTER — Telehealth: Payer: Self-pay

## 2021-06-02 ENCOUNTER — Other Ambulatory Visit: Payer: Self-pay

## 2021-06-02 DIAGNOSIS — K625 Hemorrhage of anus and rectum: Secondary | ICD-10-CM | POA: Diagnosis not present

## 2021-06-02 DIAGNOSIS — E785 Hyperlipidemia, unspecified: Secondary | ICD-10-CM

## 2021-06-02 DIAGNOSIS — K219 Gastro-esophageal reflux disease without esophagitis: Secondary | ICD-10-CM

## 2021-06-02 DIAGNOSIS — R739 Hyperglycemia, unspecified: Secondary | ICD-10-CM | POA: Diagnosis not present

## 2021-06-02 DIAGNOSIS — E559 Vitamin D deficiency, unspecified: Secondary | ICD-10-CM | POA: Diagnosis not present

## 2021-06-02 NOTE — Telephone Encounter (Signed)
Called pt to schedule lab appointment in a week and physical in 6 months

## 2021-06-02 NOTE — Patient Instructions (Signed)
Recommend calcium intake of 1200 to 1500 mg daily, divided into roughly 3 doses. Best source is the diet and a single dairy serving is about 500 mg, a supplement of calcium citrate once or twice daily to balance diet is fine if not getting enough in diet. Also need Vitamin D 2000 IU caps, 1 cap daily if not already taking vitamin D. Also recommend weight baring exercise on hips and upper body to keep bones strong 

## 2021-06-03 NOTE — Assessment & Plan Note (Signed)
Supplement and monitor 

## 2021-06-03 NOTE — Assessment & Plan Note (Signed)
She has been seen by Dr Bryan Lemma of GI and has had some hemorrhoids banded and is doing better. She is due for colonoscopy next year.

## 2021-06-03 NOTE — Assessment & Plan Note (Signed)
hgba1c acceptable, minimize simple carbs. Increase exercise as tolerated.  

## 2021-06-03 NOTE — Assessment & Plan Note (Signed)
Encourage heart healthy diet such as MIND or DASH diet, increase exercise, avoid trans fats, simple carbohydrates and processed foods, consider a krill or fish or flaxseed oil cap daily.  °

## 2021-06-03 NOTE — Progress Notes (Signed)
MyChart Video Visit    Virtual Visit via Video Note   This visit type was conducted due to national recommendations for restrictions regarding the COVID-19 Pandemic (e.g. social distancing) in an effort to limit this patient's exposure and mitigate transmission in our community. This patient is at least at moderate risk for complications without adequate follow up. This format is felt to be most appropriate for this patient at this time. Physical exam was limited by quality of the video and audio technology used for the visit. S Chism, CMA was able to get the patient set up on a video visit.  Patient location: home Patient and provider in visit Provider location: Office  I discussed the limitations of evaluation and management by telemedicine and the availability of in person appointments. The patient expressed understanding and agreed to proceed.  Visit Date: 06/02/2021  Today's healthcare provider: Penni Homans, MD     Subjective:    Patient ID: Vickie Thompson, female    DOB: [birth year], 50 y.o.   MRN: [id number]  Chief Complaint  Patient presents with   Follow-up    HPI Patient is in today for follow up on chronic medical concerns. She is feeling well. No recent febrile illness or hospitalizations. She is eating well and staying busy. She has had her hemorrhoids banded and that has been helpful. Colonoscopy is due next year. Denies CP/palp/SOB/HA/congestion/fevers/GI or GU c/o. Taking meds as prescribed   Past Medical History:  Diagnosis Date   Contraceptive management 07/13/2015   Contraceptive management 07/13/2015   Contraceptive management 07/13/2015   Menarche at 17 Regular and moderate flow No history of abnormal pap in past G2P2, s/p 2 SVD No history of abnormal MGM No concerns today No gyn surgeries LMP 12/15/2015, normal, 5 days    GERD (gastroesophageal reflux disease)    H/O gestational diabetes mellitus, not currently pregnant    Hemorrhoid    Hyperglycemia  05/03/2017   Hyperlipidemia    Preventative health care 01/05/2016    No past surgical history on file.  Family History  Problem Relation Age of Onset   Mental illness Mother        depression   Diabetes Father    Hypertension Father    Hypertension Sister    Gout Brother    Cancer Maternal Aunt        lung   Colon cancer Neg Hx    Esophageal cancer Neg Hx    Rectal cancer Neg Hx    Stomach cancer Neg Hx     Social History   Socioeconomic History   Marital status: Married    Spouse name: Not on file   Number of children: 2   Years of education: Not on file   Highest education level: Not on file  Occupational History   Not on file  Tobacco Use   Smoking status: Never   Smokeless tobacco: Never  Vaping Use   Vaping Use: Never used  Substance and Sexual Activity   Alcohol use: Yes    Comment: occasionally   Drug use: No   Sexual activity: Not on file  Other Topics Concern   Not on file  Social History Narrative   Not on file   Social Determinants of Health   Financial Resource Strain: Not on file  Food Insecurity: Not on file  Transportation Needs: Not on file  Physical Activity: Not on file  Stress: Not on file  Social Connections: Not on file  Intimate Partner  Violence: Not on file    Outpatient Medications Prior to Visit  Medication Sig Dispense Refill   Ascorbic Acid (VITAMIN C PO) Take by mouth.     desogestrel-ethinyl estradiol (MIRCETTE) 0.15-0.02/0.01 MG (21/5) tablet TAKE 1 TABLET BY MOUTH DAILY. 84 tablet 3   desogestrel-ethinyl estradiol (PIMTREA) 0.15-0.02/0.01 MG (21/5) tablet Take 1 tablet by mouth daily. 84 tablet 3   famotidine (PEPCID) 40 MG tablet Take 1 tablet (40 mg total) by mouth at bedtime. 90 tablet 0   ibuprofen (ADVIL) 800 MG tablet Take 800 mg by mouth 2 (two) times daily as needed.     metoprolol succinate (TOPROL-XL) 25 MG 24 hr tablet TAKE 1 TABLET BY MOUTH ONCE DAILY 60 tablet 5   OVER THE COUNTER MEDICATION Magnesium powder  nightly     rosuvastatin (CRESTOR) 40 MG tablet Take 1 tablet (40 mg total) by mouth daily. 90 tablet 0   Zinc Acetate, Oral, (ZINC ACETATE PO) Take by mouth.     No facility-administered medications prior to visit.    Allergies  Allergen Reactions   Retinoids Shortness Of Breath   Sudafed [Pseudoephedrine Hcl] Palpitations    Review of Systems  Constitutional:  Negative for fever and malaise/fatigue.  HENT:  Negative for congestion.   Eyes:  Negative for blurred vision.  Respiratory:  Negative for shortness of breath.   Cardiovascular:  Negative for chest pain, palpitations and leg swelling.  Gastrointestinal:  Negative for abdominal pain, blood in stool and nausea.  Genitourinary:  Negative for dysuria and frequency.  Musculoskeletal:  Negative for falls.  Skin:  Negative for rash.  Neurological:  Negative for dizziness, loss of consciousness and headaches.  Endo/Heme/Allergies:  Negative for environmental allergies.  Psychiatric/Behavioral:  Negative for depression. The patient is not nervous/anxious.       Objective:    Physical Exam Constitutional:      General: She is not in acute distress.    Appearance: Normal appearance. She is not ill-appearing or toxic-appearing.  HENT:     Head: Normocephalic and atraumatic.     Right Ear: External ear normal.     Left Ear: External ear normal.     Nose: Nose normal.  Eyes:     General:        Right eye: No discharge.        Left eye: No discharge.  Pulmonary:     Effort: Pulmonary effort is normal.  Skin:    Findings: No rash.  Neurological:     Mental Status: She is alert and oriented to person, place, and time.  Psychiatric:        Behavior: Behavior normal.    There were no vitals taken for this visit. Wt Readings from Last 3 Encounters:  04/02/21 131 lb 6 oz (59.6 kg)  03/15/21 127 lb 8 oz (57.8 kg)  08/20/20 138 lb 1.9 oz (62.7 kg)    Diabetic Foot Exam - Simple   No data filed    Lab Results   Component Value Date   WBC 6.6 03/13/2020   HGB 13.6 03/13/2020   HCT 39.8 03/13/2020   PLT 267.0 03/13/2020   GLUCOSE 110 (H) 03/13/2020   CHOL 204 (H) 03/13/2020   TRIG 217.0 (H) 03/13/2020   HDL 51.50 03/13/2020   LDLDIRECT 103.0 03/13/2020   LDLCALC 73 08/10/2017   ALT 22 03/13/2020   AST 24 03/13/2020   NA 137 03/13/2020   K 3.8 03/13/2020   CL 103 03/13/2020   CREATININE  0.65 03/13/2020   BUN 15 03/13/2020   CO2 29 03/13/2020   TSH 1.87 03/13/2020   HGBA1C 6.2 03/13/2020    Lab Results  Component Value Date   TSH 1.87 03/13/2020   Lab Results  Component Value Date   WBC 6.6 03/13/2020   HGB 13.6 03/13/2020   HCT 39.8 03/13/2020   MCV 90.2 03/13/2020   PLT 267.0 03/13/2020   Lab Results  Component Value Date   NA 137 03/13/2020   K 3.8 03/13/2020   CO2 29 03/13/2020   GLUCOSE 110 (H) 03/13/2020   BUN 15 03/13/2020   CREATININE 0.65 03/13/2020   BILITOT 0.6 03/13/2020   ALKPHOS 74 03/13/2020   AST 24 03/13/2020   ALT 22 03/13/2020   PROT 6.9 03/13/2020   ALBUMIN 4.3 03/13/2020   CALCIUM 9.3 03/13/2020   GFR 97.24 03/13/2020   Lab Results  Component Value Date   CHOL 204 (H) 03/13/2020   Lab Results  Component Value Date   HDL 51.50 03/13/2020   Lab Results  Component Value Date   LDLCALC 73 08/10/2017   Lab Results  Component Value Date   TRIG 217.0 (H) 03/13/2020   Lab Results  Component Value Date   CHOLHDL 4 03/13/2020   Lab Results  Component Value Date   HGBA1C 6.2 03/13/2020       Assessment & Plan:   Problem List Items Addressed This Visit     Hyperlipidemia    Encourage heart healthy diet such as MIND or DASH diet, increase exercise, avoid trans fats, simple carbohydrates and processed foods, consider a krill or fish or flaxseed oil cap daily.        Relevant Orders   Lipid panel   TSH   Basic Metabolic Panel (BMET)   CBC with Differential/Platelet   Vitamin D deficiency - Primary    Supplement and monitor        Relevant Orders   VITAMIN D 25 Hydroxy (Vit-D Deficiency, Fractures)   Hyperglycemia    hgba1c acceptable, minimize simple carbs. Increase exercise as tolerated.        Relevant Orders   Comprehensive metabolic panel   TSH   Hemoglobin [id number]   Basic Metabolic Panel (BMET)   Rectal bleeding    She has been seen by Dr Bryan Lemma of GI and has had some hemorrhoids banded and is doing better. She is due for colonoscopy next year.        Acid reflux    I am having Analise Domin. Meckler maintain her Ascorbic Acid (VITAMIN C PO), (Zinc Acetate, Oral, (ZINC ACETATE PO)), desogestrel-ethinyl estradiol, desogestrel-ethinyl estradiol, OVER THE COUNTER MEDICATION, ibuprofen, rosuvastatin, famotidine, and metoprolol succinate.  No orders of the defined types were placed in this encounter.   I discussed the assessment and treatment plan with the patient. The patient was provided an opportunity to ask questions and all were answered. The patient agreed with the plan and demonstrated an understanding of the instructions.   The patient was advised to call back or seek an in-person evaluation if the symptoms worsen or if the condition fails to improve as anticipated.  I provided 21 minutes of face-to-face time during this encounter.   Penni Homans, MD [facility] [phone] (phone) [phone] (fax)  [place]

## 2021-06-05 ENCOUNTER — Other Ambulatory Visit (INDEPENDENT_AMBULATORY_CARE_PROVIDER_SITE_OTHER): Payer: 59

## 2021-06-05 ENCOUNTER — Other Ambulatory Visit: Payer: Self-pay

## 2021-06-05 DIAGNOSIS — E559 Vitamin D deficiency, unspecified: Secondary | ICD-10-CM

## 2021-06-05 DIAGNOSIS — E785 Hyperlipidemia, unspecified: Secondary | ICD-10-CM | POA: Diagnosis not present

## 2021-06-05 DIAGNOSIS — R739 Hyperglycemia, unspecified: Secondary | ICD-10-CM | POA: Diagnosis not present

## 2021-06-05 LAB — TSH: TSH: 2.29 u[IU]/mL (ref 0.35–5.50)

## 2021-06-05 LAB — CBC WITH DIFFERENTIAL/PLATELET
Basophils Absolute: 0 10*3/uL (ref 0.0–0.1)
Basophils Relative: 0.8 % (ref 0.0–3.0)
Eosinophils Absolute: 0.1 10*3/uL (ref 0.0–0.7)
Eosinophils Relative: 1.1 % (ref 0.0–5.0)
HCT: 40 % (ref 36.0–46.0)
Hemoglobin: 13.2 g/dL (ref 12.0–15.0)
Lymphocytes Relative: 29.3 % (ref 12.0–46.0)
Lymphs Abs: 1.5 10*3/uL (ref 0.7–4.0)
MCHC: 33 g/dL (ref 30.0–36.0)
MCV: 90.6 fl (ref 78.0–100.0)
Monocytes Absolute: 0.3 10*3/uL (ref 0.1–1.0)
Monocytes Relative: 6.9 % (ref 3.0–12.0)
Neutro Abs: 3.1 10*3/uL (ref 1.4–7.7)
Neutrophils Relative %: 61.9 % (ref 43.0–77.0)
Platelets: 278 10*3/uL (ref 150.0–400.0)
RBC: 4.41 Mil/uL (ref 3.87–5.11)
RDW: 13.2 % (ref 11.5–15.5)
WBC: 5.1 10*3/uL (ref 4.0–10.5)

## 2021-06-05 LAB — COMPREHENSIVE METABOLIC PANEL
ALT: 18 U/L (ref 0–35)
AST: 22 U/L (ref 0–37)
Albumin: 4.2 g/dL (ref 3.5–5.2)
Alkaline Phosphatase: 65 U/L (ref 39–117)
BUN: 18 mg/dL (ref 6–23)
CO2: 28 mEq/L (ref 19–32)
Calcium: 9.4 mg/dL (ref 8.4–10.5)
Chloride: 102 mEq/L (ref 96–112)
Creatinine, Ser: 0.71 mg/dL (ref 0.40–1.20)
GFR: 99.89 mL/min (ref >60.00)
Glucose, Bld: 111 mg/dL — ABNORMAL HIGH (ref 70–99)
Potassium: 3.9 mEq/L (ref 3.5–5.1)
Sodium: 138 mEq/L (ref 135–145)
Total Bilirubin: 0.4 mg/dL (ref 0.2–1.2)
Total Protein: 7 g/dL (ref 6.0–8.3)

## 2021-06-05 LAB — LIPID PANEL
Cholesterol: 166 mg/dL (ref 0–200)
HDL: 55.1 mg/dL (ref >39.00)
NonHDL: 110.91
Total CHOL/HDL Ratio: 3
Triglycerides: 294 mg/dL — ABNORMAL HIGH (ref 0.0–149.0)
VLDL: 58.8 mg/dL — ABNORMAL HIGH (ref 0.0–40.0)

## 2021-06-05 LAB — LDL CHOLESTEROL, DIRECT: Direct LDL: 62 mg/dL

## 2021-06-05 LAB — HEMOGLOBIN A1C: Hgb A1c MFr Bld: 6.2 % (ref 4.6–6.5)

## 2021-06-05 LAB — VITAMIN D 25 HYDROXY (VIT D DEFICIENCY, FRACTURES): VITD: 33.36 ng/mL (ref 30.00–100.00)

## 2021-06-19 ENCOUNTER — Telehealth: Payer: Self-pay | Admitting: Family Medicine

## 2021-06-19 NOTE — Telephone Encounter (Signed)
Pt aware.

## 2021-06-19 NOTE — Telephone Encounter (Signed)
Pt states she received a call to review labs but she was on vacation and would like a call back please

## 2021-06-30 ENCOUNTER — Other Ambulatory Visit: Payer: Self-pay | Admitting: Family Medicine

## 2021-06-30 ENCOUNTER — Other Ambulatory Visit (HOSPITAL_BASED_OUTPATIENT_CLINIC_OR_DEPARTMENT_OTHER): Payer: Self-pay

## 2021-06-30 MED ORDER — DESOGESTREL-ETHINYL ESTRADIOL 0.15-0.02/0.01 MG (21/5) PO TABS
1.0000 | ORAL_TABLET | Freq: Every day | ORAL | 3 refills | Status: DC
  Filled 2021-06-30: qty 84, 84d supply, fill #0
  Filled 2021-09-21: qty 84, 84d supply, fill #1
  Filled 2021-12-14: qty 84, 84d supply, fill #2
  Filled 2022-03-10: qty 84, 84d supply, fill #3

## 2021-07-02 ENCOUNTER — Other Ambulatory Visit (HOSPITAL_BASED_OUTPATIENT_CLINIC_OR_DEPARTMENT_OTHER): Payer: Self-pay

## 2021-08-16 ENCOUNTER — Other Ambulatory Visit: Payer: Self-pay | Admitting: Family Medicine

## 2021-08-17 ENCOUNTER — Other Ambulatory Visit (HOSPITAL_BASED_OUTPATIENT_CLINIC_OR_DEPARTMENT_OTHER): Payer: Self-pay

## 2021-08-17 MED ORDER — ROSUVASTATIN CALCIUM 40 MG PO TABS
40.0000 mg | ORAL_TABLET | Freq: Every day | ORAL | 1 refills | Status: DC
Start: 2021-08-17 — End: 2022-02-23
  Filled 2021-08-17: qty 90, 90d supply, fill #0
  Filled 2021-11-24: qty 90, 90d supply, fill #1

## 2021-09-03 ENCOUNTER — Other Ambulatory Visit (HOSPITAL_BASED_OUTPATIENT_CLINIC_OR_DEPARTMENT_OTHER): Payer: Self-pay

## 2021-09-22 ENCOUNTER — Other Ambulatory Visit (HOSPITAL_BASED_OUTPATIENT_CLINIC_OR_DEPARTMENT_OTHER): Payer: Self-pay

## 2021-10-20 ENCOUNTER — Telehealth: Payer: Self-pay | Admitting: Family Medicine

## 2021-10-20 NOTE — Telephone Encounter (Signed)
Patient states she would like to start taking Ozempic to control her diabetes better. She is concerned that her blood sugar has been too high recently. Please advice.

## 2021-10-21 ENCOUNTER — Other Ambulatory Visit: Payer: Self-pay

## 2021-10-21 DIAGNOSIS — E559 Vitamin D deficiency, unspecified: Secondary | ICD-10-CM

## 2021-10-21 DIAGNOSIS — R739 Hyperglycemia, unspecified: Secondary | ICD-10-CM

## 2021-10-21 DIAGNOSIS — E785 Hyperlipidemia, unspecified: Secondary | ICD-10-CM

## 2021-10-21 NOTE — Telephone Encounter (Signed)
Spoke with pt she has not been checking her sugar levels and recording them. I told her to check it  and record them once a day until her lab appointment so we can see them.

## 2021-10-29 ENCOUNTER — Other Ambulatory Visit (INDEPENDENT_AMBULATORY_CARE_PROVIDER_SITE_OTHER): Payer: 59

## 2021-10-29 DIAGNOSIS — E785 Hyperlipidemia, unspecified: Secondary | ICD-10-CM | POA: Diagnosis not present

## 2021-10-29 DIAGNOSIS — R739 Hyperglycemia, unspecified: Secondary | ICD-10-CM

## 2021-10-29 DIAGNOSIS — E559 Vitamin D deficiency, unspecified: Secondary | ICD-10-CM | POA: Diagnosis not present

## 2021-10-29 LAB — CBC WITH DIFFERENTIAL/PLATELET
Basophils Absolute: 0.1 10*3/uL (ref 0.0–0.1)
Basophils Relative: 0.8 % (ref 0.0–3.0)
Eosinophils Absolute: 0.1 10*3/uL (ref 0.0–0.7)
Eosinophils Relative: 1.7 % (ref 0.0–5.0)
HCT: 41.9 % (ref 36.0–46.0)
Hemoglobin: 13.8 g/dL (ref 12.0–15.0)
Lymphocytes Relative: 26.7 % (ref 12.0–46.0)
Lymphs Abs: 1.7 10*3/uL (ref 0.7–4.0)
MCHC: 32.8 g/dL (ref 30.0–36.0)
MCV: 92.8 fl (ref 78.0–100.0)
Monocytes Absolute: 0.4 10*3/uL (ref 0.1–1.0)
Monocytes Relative: 6.5 % (ref 3.0–12.0)
Neutro Abs: 4.2 10*3/uL (ref 1.4–7.7)
Neutrophils Relative %: 64.3 % (ref 43.0–77.0)
Platelets: 273 10*3/uL (ref 150.0–400.0)
RBC: 4.52 Mil/uL (ref 3.87–5.11)
RDW: 13.3 % (ref 11.5–15.5)
WBC: 6.5 10*3/uL (ref 4.0–10.5)

## 2021-10-29 LAB — COMPREHENSIVE METABOLIC PANEL
ALT: 24 U/L (ref 0–35)
AST: 22 U/L (ref 0–37)
Albumin: 4.4 g/dL (ref 3.5–5.2)
Alkaline Phosphatase: 62 U/L (ref 39–117)
BUN: 12 mg/dL (ref 6–23)
CO2: 28 mEq/L (ref 19–32)
Calcium: 9.4 mg/dL (ref 8.4–10.5)
Chloride: 103 mEq/L (ref 96–112)
Creatinine, Ser: 0.74 mg/dL (ref 0.40–1.20)
GFR: 94.79 mL/min (ref >60.00)
Glucose, Bld: 110 mg/dL — ABNORMAL HIGH (ref 70–99)
Potassium: 3.8 mEq/L (ref 3.5–5.1)
Sodium: 139 mEq/L (ref 135–145)
Total Bilirubin: 0.7 mg/dL (ref 0.2–1.2)
Total Protein: 7.4 g/dL (ref 6.0–8.3)

## 2021-10-29 LAB — LIPID PANEL
Cholesterol: 192 mg/dL (ref 0–200)
HDL: 67.7 mg/dL (ref >39.00)
NonHDL: 124.28
Total CHOL/HDL Ratio: 3
Triglycerides: 203 mg/dL — ABNORMAL HIGH (ref 0.0–149.0)
VLDL: 40.6 mg/dL — ABNORMAL HIGH (ref 0.0–40.0)

## 2021-10-29 LAB — HEMOGLOBIN A1C: Hgb A1c MFr Bld: 6.5 % (ref 4.6–6.5)

## 2021-10-29 LAB — TSH: TSH: 3.01 u[IU]/mL (ref 0.35–5.50)

## 2021-10-29 LAB — LDL CHOLESTEROL, DIRECT: Direct LDL: 99 mg/dL

## 2021-11-03 LAB — VITAMIN D 1,25 DIHYDROXY
Vitamin D 1, 25 (OH)2 Total: 60 pg/mL (ref 18–72)
Vitamin D2 1, 25 (OH)2: <8 pg/mL
Vitamin D3 1, 25 (OH)2: 60 pg/mL

## 2021-11-24 ENCOUNTER — Other Ambulatory Visit (HOSPITAL_BASED_OUTPATIENT_CLINIC_OR_DEPARTMENT_OTHER): Payer: Self-pay

## 2021-12-01 ENCOUNTER — Ambulatory Visit (INDEPENDENT_AMBULATORY_CARE_PROVIDER_SITE_OTHER): Payer: 59 | Admitting: Family Medicine

## 2021-12-01 ENCOUNTER — Other Ambulatory Visit (HOSPITAL_BASED_OUTPATIENT_CLINIC_OR_DEPARTMENT_OTHER): Payer: Self-pay

## 2021-12-01 VITALS — BP 104/60 | HR 68 | Temp 98.4°F | Resp 16 | Ht 60.0 in | Wt 143.2 lb

## 2021-12-01 DIAGNOSIS — R002 Palpitations: Secondary | ICD-10-CM

## 2021-12-01 DIAGNOSIS — Z113 Encounter for screening for infections with a predominantly sexual mode of transmission: Secondary | ICD-10-CM

## 2021-12-01 DIAGNOSIS — E559 Vitamin D deficiency, unspecified: Secondary | ICD-10-CM

## 2021-12-01 DIAGNOSIS — E785 Hyperlipidemia, unspecified: Secondary | ICD-10-CM

## 2021-12-01 DIAGNOSIS — Z1231 Encounter for screening mammogram for malignant neoplasm of breast: Secondary | ICD-10-CM

## 2021-12-01 DIAGNOSIS — E1169 Type 2 diabetes mellitus with other specified complication: Secondary | ICD-10-CM

## 2021-12-01 DIAGNOSIS — Z1159 Encounter for screening for other viral diseases: Secondary | ICD-10-CM

## 2021-12-01 MED ORDER — METFORMIN HCL 500 MG PO TABS
500.0000 mg | ORAL_TABLET | Freq: Every day | ORAL | 1 refills | Status: DC
  Filled 2021-12-01: qty 90, 90d supply, fill #0
  Filled 2022-02-22: qty 90, 90d supply, fill #1

## 2021-12-01 NOTE — Patient Instructions (Addendum)
MIND diet combines mediterranean and DASH diet Multivitamins with minerals, vitamin D 1000 IU, fish oil daily  6800 steps with 1/2 hours of moderate activity or 9800 Chronic dehydration leads to neurologic disease development Drink 60-80 ounces 6-8 hours sleep nightly  Stress management  Prevnar 20 once  Preventive Care 50-50 Years Old, Female Preventive care refers to lifestyle choices and visits with your health care provider that can promote health and wellness. Preventive care visits are also called wellness exams. What can I expect for my preventive care visit? Counseling Your health care provider may ask you questions about your: Medical history, including: Past medical problems. Family medical history. Pregnancy history. Current health, including: Menstrual cycle. Method of birth control. Emotional well-being. Home life and relationship well-being. Sexual activity and sexual health. Lifestyle, including: Alcohol, nicotine or tobacco, and drug use. Access to firearms. Diet, exercise, and sleep habits. Work and work Statistician. Sunscreen use. Safety issues such as seatbelt and bike helmet use. Physical exam Your health care provider will check your: Height and weight. These may be used to calculate your BMI (body mass index). BMI is a measurement that tells if you are at a healthy weight. Waist circumference. This measures the distance around your waistline. This measurement also tells if you are at a healthy weight and may help predict your risk of certain diseases, such as type 2 diabetes and high blood pressure. Heart rate and blood pressure. Body temperature. Skin for abnormal spots. What immunizations do I need? Vaccines are usually given at various ages, according to a schedule. Your health care provider will recommend vaccines for you based on your age, medical history, and lifestyle or other factors, such as travel or where you work. What tests do I  need? Screening Your health care provider may recommend screening tests for certain conditions. This may include: Lipid and cholesterol levels. Diabetes screening. This is done by checking your blood sugar (glucose) after you have not eaten for a while (fasting). Pelvic exam and Pap test. Hepatitis B test. Hepatitis C test. HIV (human immunodeficiency virus) test. STI (sexually transmitted infection) testing, if you are at risk. Lung cancer screening. Colorectal cancer screening. Mammogram. Talk with your health care provider about when you should start having regular mammograms. This may depend on whether you have a family history of breast cancer. BRCA-related cancer screening. This may be done if you have a family history of breast, ovarian, tubal, or peritoneal cancers. Bone density scan. This is done to screen for osteoporosis. Talk with your health care provider about your test results, treatment options, and if necessary, the need for more tests. Follow these instructions at home: Eating and drinking  Eat a diet that includes fresh fruits and vegetables, whole grains, lean protein, and low-fat dairy products. Take vitamin and mineral supplements as recommended by your health care provider. Do not drink alcohol if: Your health care provider tells you not to drink. You are pregnant, may be pregnant, or are planning to become pregnant. If you drink alcohol: Limit how much you have to 0-1 drink a day. Know how much alcohol is in your drink. In the [place] one drink equals one 12 oz bottle of beer (355 mL), one 5 oz glass of wine (148 mL), or one 1 oz glass of hard liquor (44 mL). Lifestyle Brush your teeth every morning and night with fluoride toothpaste. Floss one time each day. Exercise for at least 30 minutes 5 or more days each week. Do not use any  products that contain nicotine or tobacco. These products include cigarettes, chewing tobacco, and vaping devices, such as  e-cigarettes. If you need help quitting, ask your health care provider. Do not use drugs. If you are sexually active, practice safe sex. Use a condom or other form of protection to prevent STIs. If you do not wish to become pregnant, use a form of birth control. If you plan to become pregnant, see your health care provider for a prepregnancy visit. Take aspirin only as told by your health care provider. Make sure that you understand how much to take and what form to take. Work with your health care provider to find out whether it is safe and beneficial for you to take aspirin daily. Find healthy ways to manage stress, such as: Meditation, yoga, or listening to music. Journaling. Talking to a trusted person. Spending time with friends and family. Minimize exposure to UV radiation to reduce your risk of skin cancer. Safety Always wear your seat belt while driving or riding in a vehicle. Do not drive: If you have been drinking alcohol. Do not ride with someone who has been drinking. When you are tired or distracted. While texting. If you have been using any mind-altering substances or drugs. Wear a helmet and other protective equipment during sports activities. If you have firearms in your house, make sure you follow all gun safety procedures. Seek help if you have been physically or sexually abused. What's next? Visit your health care provider once a year for an annual wellness visit. Ask your health care provider how often you should have your eyes and teeth checked. Stay up to date on all vaccines. This information is not intended to replace advice given to you by your health care provider. Make sure you discuss any questions you have with your health care provider. Document Revised: 04/08/2021 Document Reviewed: 04/08/2021 Elsevier Patient Education  [place].

## 2021-12-07 NOTE — Progress Notes (Signed)
Patient not seen.

## 2021-12-08 ENCOUNTER — Telehealth (HOSPITAL_BASED_OUTPATIENT_CLINIC_OR_DEPARTMENT_OTHER): Payer: Self-pay

## 2021-12-15 ENCOUNTER — Other Ambulatory Visit (HOSPITAL_BASED_OUTPATIENT_CLINIC_OR_DEPARTMENT_OTHER): Payer: Self-pay

## 2021-12-21 ENCOUNTER — Ambulatory Visit (HOSPITAL_BASED_OUTPATIENT_CLINIC_OR_DEPARTMENT_OTHER): Payer: 59

## 2021-12-21 ENCOUNTER — Encounter (HOSPITAL_BASED_OUTPATIENT_CLINIC_OR_DEPARTMENT_OTHER): Payer: Self-pay

## 2021-12-21 ENCOUNTER — Ambulatory Visit (HOSPITAL_BASED_OUTPATIENT_CLINIC_OR_DEPARTMENT_OTHER)
Admission: RE | Admit: 2021-12-21 | Discharge: 2021-12-21 | Disposition: A | Discharge status: Home / Self Care | Payer: 59 | Source: Ambulatory Visit | Attending: Family Medicine | Admitting: Family Medicine

## 2021-12-21 ENCOUNTER — Other Ambulatory Visit: Payer: Self-pay

## 2021-12-21 DIAGNOSIS — Z1231 Encounter for screening mammogram for malignant neoplasm of breast: Secondary | ICD-10-CM | POA: Insufficient documentation

## 2021-12-30 ENCOUNTER — Other Ambulatory Visit: Payer: Self-pay | Admitting: Family Medicine

## 2021-12-31 ENCOUNTER — Other Ambulatory Visit (HOSPITAL_BASED_OUTPATIENT_CLINIC_OR_DEPARTMENT_OTHER): Payer: Self-pay

## 2021-12-31 MED ORDER — FAMOTIDINE 40 MG PO TABS
40.0000 mg | ORAL_TABLET | Freq: Every day | ORAL | 0 refills | Status: DC
  Filled 2021-12-31: qty 90, 90d supply, fill #0

## 2022-01-11 ENCOUNTER — Ambulatory Visit: Payer: 59 | Admitting: Cardiology

## 2022-01-12 ENCOUNTER — Other Ambulatory Visit (HOSPITAL_BASED_OUTPATIENT_CLINIC_OR_DEPARTMENT_OTHER): Payer: Self-pay

## 2022-01-12 ENCOUNTER — Ambulatory Visit: Payer: 59 | Admitting: Podiatry

## 2022-01-12 ENCOUNTER — Other Ambulatory Visit: Payer: Self-pay

## 2022-01-12 ENCOUNTER — Ambulatory Visit (INDEPENDENT_AMBULATORY_CARE_PROVIDER_SITE_OTHER): Payer: 59

## 2022-01-12 DIAGNOSIS — M722 Plantar fascial fibromatosis: Secondary | ICD-10-CM

## 2022-01-12 DIAGNOSIS — R52 Pain, unspecified: Secondary | ICD-10-CM

## 2022-01-12 MED ORDER — TRIAMCINOLONE ACETONIDE 10 MG/ML IJ SUSP
10.0000 mg | Freq: Once | INTRAMUSCULAR | Status: AC
  Administered 2022-01-12: 10 mg

## 2022-01-12 MED ORDER — MELOXICAM 15 MG PO TABS
15.0000 mg | ORAL_TABLET | Freq: Every day | ORAL | 0 refills | Status: AC | PRN
  Filled 2022-01-12: qty 30, 30d supply, fill #0

## 2022-01-12 NOTE — Patient Instructions (Signed)
If was nice to meet you today. If you have any questions or any further concerns, please feel fee to give me a call. You can call our office at [phone] or please feel fee to send me a message through [place].  ? ?--- ? ?For instructions on how to put on your Plantar Fascial Brace, please visit [email or web address] ? ?--- ? ?Plantar Fasciitis (Heel Spur Syndrome) ?with Rehab ?The plantar fascia is a fibrous, ligament-like, soft-tissue structure that spans the bottom of the foot. Plantar fasciitis is a condition that causes pain in the foot due to inflammation of the tissue. ?SYMPTOMS  ?Pain and tenderness on the underneath side of the foot. ?Pain that worsens with standing or walking. ?CAUSES  ?Plantar fasciitis is caused by irritation and injury to the plantar fascia on the underneath side of the foot. Common mechanisms of injury include: ?Direct trauma to bottom of the foot. ?Damage to a small nerve that runs under the foot where the main fascia attaches to the heel bone. ?Stress placed on the plantar fascia due to bone spurs. ?RISK INCREASES WITH:  ?Activities that place stress on the plantar fascia (running, jumping, pivoting, or cutting). ?Poor strength and flexibility. ?Improperly fitted shoes. ?Tight calf muscles. ?Flat feet. ?Failure to warm-up properly before activity. ?Obesity. ?PREVENTION ?Warm up and stretch properly before activity. ?Allow for adequate recovery between workouts. ?Maintain physical fitness: ?Strength, flexibility, and endurance. ?Cardiovascular fitness. ?Maintain a health body weight. ?Avoid stress on the plantar fascia. ?Wear properly fitted shoes, including arch supports for individuals who have flat feet. ? ?PROGNOSIS  ?If treated properly, then the symptoms of plantar fasciitis usually resolve without surgery. However, occasionally surgery is necessary. ? ?RELATED COMPLICATIONS  ?Recurrent symptoms that may result in a chronic condition. ?Problems of the lower back that are  caused by compensating for the injury, such as limping. ?Pain or weakness of the foot during push-off following surgery. ?Chronic inflammation, scarring, and partial or complete fascia tear, occurring more often from repeated injections. ? ?TREATMENT  ?Treatment initially involves the use of ice and medication to help reduce pain and inflammation. The use of strengthening and stretching exercises may help reduce pain with activity, especially stretches of the Achilles tendon. These exercises may be performed at home or with a therapist. Your caregiver may recommend that you use heel cups of arch supports to help reduce stress on the plantar fascia. Occasionally, corticosteroid injections are given to reduce inflammation. If symptoms persist for greater than 6 months despite non-surgical (conservative), then surgery may be recommended.  ? ?MEDICATION  ?If pain medication is necessary, then nonsteroidal anti-inflammatory medications, such as aspirin and ibuprofen, or other minor pain relievers, such as acetaminophen, are often recommended. ?Do not take pain medication within 7 days before surgery. ?Prescription pain relievers may be given if deemed necessary by your caregiver. Use only as directed and only as much as you need. ?Corticosteroid injections may be given by your caregiver. These injections should be reserved for the most serious cases, because they may only be given a certain number of times. ? ?HEAT AND COLD ?Cold treatment (icing) relieves pain and reduces inflammation. Cold treatment should be applied for 10 to 15 minutes every 2 to 3 hours for inflammation and pain and immediately after any activity that aggravates your symptoms. Use ice packs or massage the area with a piece of ice (ice massage). ?Heat treatment may be used prior to performing the stretching and strengthening activities prescribed by your caregiver, physical therapist, or  Product/process development scientist. Use a heat pack or soak the injury in warm  water. ? ?SEEK IMMEDIATE MEDICAL CARE IF: ?Treatment seems to offer no benefit, or the condition worsens. ?Any medications produce adverse side effects. ? ?EXERCISES- ?RANGE OF MOTION (ROM) AND STRETCHING EXERCISES - Plantar Fasciitis (Heel Spur Syndrome) ?These exercises may help you when beginning to rehabilitate your injury. Your symptoms may resolve with or without further involvement from your physician, physical therapist or athletic trainer. While completing these exercises, remember:  ?Restoring tissue flexibility helps normal motion to return to the joints. This allows healthier, less painful movement and activity. ?An effective stretch should be held for at least 30 seconds. ?A stretch should never be painful. You should only feel a gentle lengthening or release in the stretched tissue. ? ?RANGE OF MOTION - Toe Extension, Flexion ?Sit with your right / left leg crossed over your opposite knee. ?Grasp your toes and gently pull them back toward the top of your foot. You should feel a stretch on the bottom of your toes and/or foot. ?Hold this stretch for 10 seconds. ?Now, gently pull your toes toward the bottom of your foot. You should feel a stretch on the top of your toes and or foot. ?Hold this stretch for 10 seconds. ?Repeat  times. Complete this stretch 3 times per day.  ? ?RANGE OF MOTION - Ankle Dorsiflexion, Active Assisted ?Remove shoes and sit on a chair that is preferably not on a carpeted surface. ?Place right / left foot under knee. Extend your opposite leg for support. ?Keeping your heel down, slide your right / left foot back toward the chair until you feel a stretch at your ankle or calf. If you do not feel a stretch, slide your bottom forward to the edge of the chair, while still keeping your heel down. ?Hold this stretch for 10 seconds. ?Repeat 3 times. Complete this stretch 2 times per day.  ? ?STRETCH  Gastroc, Standing ?Place hands on wall. ?Extend right / left leg, keeping the front knee  somewhat bent. ?Slightly point your toes inward on your back foot. ?Keeping your right / left heel on the floor and your knee straight, shift your weight toward the wall, not allowing your back to arch. ?You should feel a gentle stretch in the right / left calf. Hold this position for 10 seconds. ?Repeat 3 times. Complete this stretch 2 times per day. ? ?STRETCH  Soleus, Standing ?Place hands on wall. ?Extend right / left leg, keeping the other knee somewhat bent. ?Slightly point your toes inward on your back foot. ?Keep your right / left heel on the floor, bend your back knee, and slightly shift your weight over the back leg so that you feel a gentle stretch deep in your back calf. ?Hold this position for 10 seconds. ?Repeat 3 times. Complete this stretch 2 times per day. ? ?Google, Standing  ?Note: This exercise can place a lot of stress on your foot and ankle. Please complete this exercise only if specifically instructed by your caregiver.  ?Place the ball of your right / left foot on a step, keeping your other foot firmly on the same step. ?Hold on to the wall or a rail for balance. ?Slowly lift your other foot, allowing your body weight to press your heel down over the edge of the step. ?You should feel a stretch in your right / left calf. ?Hold this position for 10 seconds. ?Repeat this exercise with a slight bend in  your right / left knee. ?Repeat 3 times. Complete this stretch 2 times per day.  ? ?STRENGTHENING EXERCISES - Plantar Fasciitis (Heel Spur Syndrome)  ?These exercises may help you when beginning to rehabilitate your injury. They may resolve your symptoms with or without further involvement from your physician, physical therapist or athletic trainer. While completing these exercises, remember:  ?Muscles can gain both the endurance and the strength needed for everyday activities through controlled exercises. ?Complete these exercises as instructed by your physician, physical  therapist or athletic trainer. Progress the resistance and repetitions only as guided. ? ?STRENGTH - Towel Curls ?Sit in a chair positioned on a non-carpeted surface. ?Place your foot on a towel, keeping your h

## 2022-01-14 ENCOUNTER — Ambulatory Visit (INDEPENDENT_AMBULATORY_CARE_PROVIDER_SITE_OTHER): Payer: 59

## 2022-01-14 ENCOUNTER — Other Ambulatory Visit: Payer: Self-pay

## 2022-01-14 ENCOUNTER — Ambulatory Visit: Payer: 59 | Admitting: Cardiology

## 2022-01-14 ENCOUNTER — Encounter: Payer: Self-pay | Admitting: Cardiology

## 2022-01-14 VITALS — BP 102/64 | HR 89 | Ht 64.0 in | Wt 138.0 lb

## 2022-01-14 DIAGNOSIS — E782 Mixed hyperlipidemia: Secondary | ICD-10-CM

## 2022-01-14 DIAGNOSIS — R002 Palpitations: Secondary | ICD-10-CM

## 2022-01-14 DIAGNOSIS — E118 Type 2 diabetes mellitus with unspecified complications: Secondary | ICD-10-CM | POA: Diagnosis not present

## 2022-01-14 HISTORY — DX: Type 2 diabetes mellitus with unspecified complications: E11.8

## 2022-01-14 HISTORY — DX: Palpitations: R00.2

## 2022-01-14 NOTE — Progress Notes (Signed)
? ?Cardiology Consultation:   ? ?Date:  01/14/2022  ? ?ID:  Vickie Thompson, DOB 06-14-1971, MRN [id number] ? ?PCP:  Mosie Lukes, MD  ?Cardiologist:  Jenne Campus, MD  ? ?Referring MD: Mosie Lukes, MD  ? ?Chief Complaint  ?Patient presents with  ? Palpitations  ? ? ?History of Present Illness:   ? ?Vickie Thompson is a 51 y.o. female who is being seen today for the evaluation of palpitations at the request of Mosie Lukes, MD. past medical history significant for dyslipidemia, recently diagnosed with diabetes.  She was referred to [place] because of palpitations she said for about 5 months she would feel skipped beats.  That usually happen at evening time when she is trying to relax at evening time.  No chest pain no shortness of breath no sweating associated with this sensation.  No sensation of dizziness.  She does not have this sensation while walking.  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  She is very busy have to walk a lot have no difficulty doing it.  She is to exercise on the regular basis but lately she comes so tired at home after work that she does not want to exercise.  She never smoked drinks alcohol only socially.  Does not have family history of premature coronary artery disease. ? ?Past Medical History:  ?Diagnosis Date  ? Contraceptive management 07/13/2015  ? Contraceptive management 07/13/2015  ? Contraceptive management 07/13/2015  ? Menarche at 17 Regular and moderate flow No history of abnormal pap in past G2P2, s/p 2 SVD No history of abnormal MGM No concerns today No gyn surgeries LMP 12/15/2015, normal, 5 days   ? GERD (gastroesophageal reflux disease)   ? H/O gestational diabetes mellitus, not currently pregnant   ? Hemorrhoid   ? Hyperglycemia 05/03/2017  ? Hyperlipidemia   ? Preventative health care 01/05/2016  ? Type 2 diabetes mellitus with complication, without long-term current use of insulin ([place]) 01/14/2022  ? ? ?History reviewed. No pertinent surgical history. ? ?Current  Medications: ?Current Meds  ?Medication Sig  ? Ascorbic Acid (VITAMIN C PO) Take by mouth.  ? desogestrel-ethinyl estradiol (PIMTREA) 0.15-0.02/0.01 MG (21/5) tablet Take 1 tablet by mouth daily.  ? desogestrel-ethinyl estradiol (PIMTREA) 0.15-0.02/0.01 MG (21/5) tablet TAKE 1 TABLET BY MOUTH DAILY.  ? famotidine (PEPCID) 40 MG tablet Take 1 tablet (40 mg total) by mouth at bedtime.  ? ibuprofen (ADVIL) 800 MG tablet Take 800 mg by mouth 2 (two) times daily as needed.  ? meloxicam (MOBIC) 15 MG tablet Take 1 tablet (15 mg total) by mouth daily as needed for pain.  ? metFORMIN (GLUCOPHAGE) 500 MG tablet Take 1 tablet (500 mg total) by mouth daily with breakfast.  ? metoprolol succinate (TOPROL-XL) 25 MG 24 hr tablet TAKE 1 TABLET BY MOUTH ONCE DAILY  ? OVER THE COUNTER MEDICATION Take 1 tablet by mouth every other day. Magnesium powder nightly  ? rosuvastatin (CRESTOR) 40 MG tablet Take 1 tablet (40 mg total) by mouth daily.  ? Zinc Acetate, Oral, (ZINC ACETATE PO) Take 1 tablet by mouth daily.  ?  ? ?Allergies:   Retinoids and Sudafed [pseudoephedrine hcl]  ? ?Social History  ? ?Socioeconomic History  ? Marital status: Married  ?  Spouse name: Not on file  ? Number of children: 2  ? Years of education: Not on file  ? Highest education level: Not on file  ?Occupational History  ? Not on file  ?  Tobacco Use  ? Smoking status: Never  ? Smokeless tobacco: Never  ?Vaping Use  ? Vaping Use: Never used  ?Substance and Sexual Activity  ? Alcohol use: Yes  ?  Comment: occasionally  ? Drug use: No  ? Sexual activity: Not on file  ?Other Topics Concern  ? Not on file  ?Social History Narrative  ? Not on file  ? ?Social Determinants of Health  ? ?Financial Resource Strain: Not on file  ?Food Insecurity: Not on file  ?Transportation Needs: Not on file  ?Physical Activity: Not on file  ?Stress: Not on file  ?Social Connections: Not on file  ?  ? ?Family History: ?The patient's family history includes Cancer in her maternal aunt;  Diabetes in her father; Gout in her brother; Hypertension in her father and sister; Mental illness in her mother. There is no history of Colon cancer, Esophageal cancer, Rectal cancer, or Stomach cancer. ?ROS:   ?Please see the history of present illness.    ?All 14 point review of systems negative except as described per history of present illness. ? ?EKGs/Labs/Other Studies Reviewed:   ? ?The following studies were reviewed today: ? ? ?EKG:  EKG is  ordered today.  The ekg ordered today demonstrates normal sinus rhythm, normal P interval, normal QS complex duration morphology no ST segment changes ? ?Recent Labs: ?10/29/2021: ALT 24; BUN 12; Creatinine, Ser 0.74; Hemoglobin 13.8; Platelets 273.0; Potassium 3.8; Sodium 139; TSH 3.01  ?Recent Lipid Panel ?   ?Component Value Date/Time  ? CHOL 192 10/29/2021 0914  ? TRIG 203.0 (H) 10/29/2021 0914  ? HDL 67.70 10/29/2021 0914  ? CHOLHDL 3 10/29/2021 0914  ? VLDL 40.6 (H) 10/29/2021 0914  ? [place] 73 08/10/2017 0801  ? LDLDIRECT 99.0 10/29/2021 0914  ? ? ?Physical Exam:   ? ?VS:  BP 102/64 (BP Location: Left Arm, Patient Position: Sitting)   Pulse 89   Ht '5\' 4"'$  (1.626 m)   Wt 138 lb (62.6 kg)   SpO2 (!) 89%   BMI 23.69 kg/m?    ? ?Wt Readings from Last 3 Encounters:  ?01/14/22 138 lb (62.6 kg)  ?12/01/21 143 lb 3.2 oz (65 kg)  ?04/02/21 131 lb 6 oz (59.6 kg)  ?  ? ?GEN:  Well nourished, well developed in no acute distress ?HEENT: Normal ?NECK: No JVD; No carotid bruits ?LYMPHATICS: No lymphadenopathy ?CARDIAC: RRR, no murmurs, no rubs, no gallops ?RESPIRATORY:  Clear to auscultation without rales, wheezing or rhonchi  ?ABDOMEN: Soft, non-tender, non-distended ?MUSCULOSKELETAL:  No edema; No deformity  ?SKIN: Warm and dry ?NEUROLOGIC:  Alert and oriented x 3 ?PSYCHIATRIC:  Normal affect  ? ?ASSESSMENT:   ? ?1. Mixed hyperlipidemia   ?2. Palpitations   ?3. Type 2 diabetes mellitus with complication, without long-term current use of insulin ([place])   ? ?PLAN:   ? ?In  order of problems listed above: ? ?Palpitations I will ask her to wear Zio patch to see exactly what kind of arrhythmia we dealing with.  From description she gave me a suspect some simple extrasystole.  She is being already on small dose beta-blocker if truly this is just extrasystole may be higher dose of beta-blocker will be needed to control the arrhythmia.  In the meantime I will ask you to have echocardiogram make sure that structurally her heart is normal.  I do not see any indication for CAD work-up since she does not have any indicators of CAD. ?Dyslipidemia: She is taking Crestor 40  I did review her last fasting lipid profile.  Sadly her triglycerides were always elevated which is clearly related to diabetes.  Total cholesterol now 192, HDL 67.  Unable to calculate LDL because of high triglycerides.  We will recheck fasting lipid profile after triglycerides will be controlled which hopefully will be accomplished by controlling her diabetes ?Diabetes mellitus new discovery she is on Glucophage.  We did talk about need to exercise which can help significantly with diabetes ? ?Medication Adjustments/Labs and Tests Ordered: ?Current medicines are reviewed at length with the patient today.  Concerns regarding medicines are outlined above.  ?No orders of the defined types were placed in this encounter. ? ?No orders of the defined types were placed in this encounter. ? ? ?Signed, ?Park Liter, MD, [facility]. ?01/14/2022 4:02 PM    ?[place] ?

## 2022-01-14 NOTE — Progress Notes (Signed)
Subjective: ?51 year old female presents the office today for new complaints of pain with concerns of left heel pain which started about a year ago.  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  She is tried stretching she tried over-the-counter inserts with minimal improvement.  No injury that she reports.  No numbness or tingling.  She like to try a steroid injection.  No other concerns today. ? ?Objective: ?AAO x3, NAD ?DP/PT pulses palpable bilaterally, CRT less than 3 seconds ?There is tenderness palpation along the plantar medial tubercle of the calcaneus and insertion of the plantar fascia on the left side.  There is no discomfort with lateral compression of calcaneus.  There is no pain with Achilles tendon.  Flexor, extensor tendons appear to be intact.  MMT 5/5.  Negative Tinel sign.   ?No pain with calf compression, swelling, warmth, erythema ? ?Assessment: ?Plantar fasciitis left side ? ?Plan: ?-All treatment options discussed with the patient including all alternatives, risks, complications.  ?-3 views of the left foot x-rays were obtained.  There is no evidence of acute fracture. There is an increase in the calcaneal inclination angle noted. ?-She was to proceed with steroid injection.  See procedure note below. ?-Discussed stretching, icing exercises daily. ?Prescribed mobic. Discussed side effects of the medication and directed to stop if any are to occur and call the office.  ?-Plantar fascia brace dispensed to help and support and stability during the day while she is working. ?-Discussion modifications and good arch supports given her high arch.  Consider custom inserts. ?-Patient encouraged to call the office with any questions, concerns, change in symptoms.   ? ?Procedure: Injection Tendon/Ligament ?Discussed alternatives, risks, complications and verbal consent was obtained.  ?Location: Left plantar fascia at the glabrous junction; medial approach. ?Skin Prep:  Alcohol. ?Injectate: 0.5cc 0.5% marcaine plain, 0.5 cc 2% lidocaine plain and, 1 cc kenalog 10. ?Disposition: Patient tolerated procedure well. Injection site dressed with a band-aid.  ?Post-injection care was discussed and return precautions discussed.  ? ?Return in about 3 weeks (around 02/02/2022). ?Trula Slade DPM ? ?

## 2022-01-14 NOTE — Patient Instructions (Signed)
Medication Instructions:  ?Your physician recommends that you continue on your current medications as directed. Please refer to the Current Medication list given to you today. ? ?*If you need a refill on your cardiac medications before your next appointment, please call your pharmacy* ? ? ?Lab Work: ?None ?If you have labs (blood work) drawn today and your tests are completely normal, you will receive your results only by: ?MyChart Message (if you have MyChart) OR ?A paper copy in the mail ?If you have any lab test that is abnormal or we need to change your treatment, we will call you to review the results. ? ? ?Testing/Procedures: ?Your physician has requested that you have an echocardiogram. Echocardiography is a painless test that uses sound waves to create images of your heart. It provides your doctor with information about the size and shape of your heart and how well your heart?s chambers and valves are working. This procedure takes approximately one hour. There are no restrictions for this procedure. ? ?A zio monitor was ordered today. It will remain on for 14 days. You will then return monitor and event diary in provided box. It takes 1-2 weeks for report to be downloaded and returned to [place]. We will call you with the results. If monitor falls off or has orange flashing light, please call Zio for further instructions.  ? ? ? ?Follow-Up: ?At [facility], you and your health needs are our priority.  As part of our continuing mission to provide you with exceptional heart care, we have created designated Provider Care Teams.  These Care Teams include your primary Cardiologist (physician) and Advanced Practice Providers (APPs -  Physician Assistants and Nurse Practitioners) who all work together to provide you with the care you need, when you need it. ? ?We recommend signing up for the patient portal called "MyChart".  Sign up information is provided on this After Visit Summary.  MyChart is used to connect with  patients for Virtual Visits (Telemedicine).  Patients are able to view lab/test results, encounter notes, upcoming appointments, etc.  Non-urgent messages can be sent to your provider as well.   ?To learn more about what you can do with MyChart, go to [email or web address].   ? ?Your next appointment:   ?3 month(s) ? ?The format for your next appointment:   ?In Person ? ?Provider:   ?Jenne Campus, MD  ? ? ?Other Instructions ?None ? ?

## 2022-02-03 ENCOUNTER — Ambulatory Visit (HOSPITAL_BASED_OUTPATIENT_CLINIC_OR_DEPARTMENT_OTHER): Payer: 59

## 2022-02-04 ENCOUNTER — Ambulatory Visit (HOSPITAL_BASED_OUTPATIENT_CLINIC_OR_DEPARTMENT_OTHER)
Admission: RE | Admit: 2022-02-04 | Discharge: 2022-02-04 | Disposition: A | Discharge status: Home / Self Care | Payer: 59 | Source: Ambulatory Visit | Attending: Cardiology | Admitting: Cardiology

## 2022-02-04 DIAGNOSIS — R002 Palpitations: Secondary | ICD-10-CM | POA: Diagnosis not present

## 2022-02-04 DIAGNOSIS — R011 Cardiac murmur, unspecified: Secondary | ICD-10-CM

## 2022-02-04 DIAGNOSIS — E782 Mixed hyperlipidemia: Secondary | ICD-10-CM | POA: Diagnosis not present

## 2022-02-04 DIAGNOSIS — E118 Type 2 diabetes mellitus with unspecified complications: Secondary | ICD-10-CM | POA: Diagnosis not present

## 2022-02-04 LAB — ECHOCARDIOGRAM COMPLETE
AR max vel: 1.8 cm2
AV Area VTI: 1.72 cm2
AV Area mean vel: 1.7 cm2
AV Mean grad: 7 mmHg
AV Peak grad: 14.6 mmHg
Ao pk vel: 1.91 m/s
Area-P 1/2: 3.65 cm2
S' Lateral: 2.5 cm

## 2022-02-04 NOTE — Progress Notes (Signed)
?  Echocardiogram ?2D Echocardiogram has been performed. ? ?Elmer Ramp ?02/04/2022, 3:58 PM ?

## 2022-02-22 ENCOUNTER — Other Ambulatory Visit (HOSPITAL_BASED_OUTPATIENT_CLINIC_OR_DEPARTMENT_OTHER): Payer: Self-pay

## 2022-02-23 ENCOUNTER — Other Ambulatory Visit: Payer: Self-pay | Admitting: Family Medicine

## 2022-02-23 ENCOUNTER — Other Ambulatory Visit (HOSPITAL_BASED_OUTPATIENT_CLINIC_OR_DEPARTMENT_OTHER): Payer: Self-pay

## 2022-02-23 MED ORDER — ROSUVASTATIN CALCIUM 40 MG PO TABS
40.0000 mg | ORAL_TABLET | Freq: Every day | ORAL | 1 refills | Status: DC
  Filled 2022-02-23: qty 90, 90d supply, fill #0
  Filled 2022-05-19: qty 90, 90d supply, fill #1

## 2022-02-26 ENCOUNTER — Telehealth: Payer: Self-pay

## 2022-02-26 NOTE — Telephone Encounter (Signed)
-----   Message from Park Liter, MD sent at 02/24/2022  9:21 PM EDT ----- ?Symptomatic extrasystole which is not dangerous.  Patient supposedly taking metoprolol succinate 25 daily if this is the case when she was wearing monitor we need to increase it to 50 mg if metoprolol succinate has been started after monitor which should continue ?

## 2022-02-26 NOTE — Telephone Encounter (Signed)
Unable to reach or LM. Mailed a letter requesting a call back.  ?

## 2022-03-01 ENCOUNTER — Telehealth: Payer: Self-pay | Admitting: Family Medicine

## 2022-03-01 DIAGNOSIS — Z111 Encounter for screening for respiratory tuberculosis: Secondary | ICD-10-CM

## 2022-03-01 NOTE — Telephone Encounter (Signed)
[facility] only has covid vaccines so I left pt a vm requesting a call back to verify that's all she needs.  Lab order placed for tb test as well and advised pt to schedule appointment. ?

## 2022-03-01 NOTE — Telephone Encounter (Signed)
Pt called back in wanted to request the Tb labs instead of PPD.  ?

## 2022-03-01 NOTE — Telephone Encounter (Signed)
Pt called stating she needed a copy of her immunization records available for pick up. Pt wants to be called when available. ? ?Pt also states that she needs Tb test for work.  ? ?Please Advise.  ?

## 2022-03-02 NOTE — Telephone Encounter (Signed)
Pt stated she is needing a medication list, all immunizations she is due for, as well as the covid vaccines she's had. She stated she can come pick them up at the front when it's ready. ?

## 2022-03-03 NOTE — Telephone Encounter (Signed)
Printed requested documents. Left pt VM informing her they are up front for pick up. ?

## 2022-03-03 NOTE — Telephone Encounter (Signed)
Pt called for update on medication list, immunizations, and covid vaccines.   ?

## 2022-03-04 DIAGNOSIS — Z135 Encounter for screening for eye and ear disorders: Secondary | ICD-10-CM | POA: Diagnosis not present

## 2022-03-04 DIAGNOSIS — H524 Presbyopia: Secondary | ICD-10-CM | POA: Diagnosis not present

## 2022-03-11 ENCOUNTER — Other Ambulatory Visit (HOSPITAL_BASED_OUTPATIENT_CLINIC_OR_DEPARTMENT_OTHER): Payer: Self-pay

## 2022-03-25 ENCOUNTER — Other Ambulatory Visit (HOSPITAL_BASED_OUTPATIENT_CLINIC_OR_DEPARTMENT_OTHER): Payer: Self-pay

## 2022-04-01 NOTE — Telephone Encounter (Signed)
Patient returning call.

## 2022-04-08 ENCOUNTER — Other Ambulatory Visit (HOSPITAL_BASED_OUTPATIENT_CLINIC_OR_DEPARTMENT_OTHER): Payer: Self-pay

## 2022-04-08 MED ORDER — METOPROLOL SUCCINATE ER 50 MG PO TB24
50.0000 mg | ORAL_TABLET | Freq: Every day | ORAL | 3 refills | Status: DC
  Filled 2022-04-08 – 2022-04-22 (×2): qty 90, 90d supply, fill #0
  Filled 2022-05-19 – 2022-08-03 (×2): qty 90, 90d supply, fill #1
  Filled 2022-11-14: qty 30, 30d supply, fill #2
  Filled 2022-12-23: qty 30, 30d supply, fill #3
  Filled 2023-01-21: qty 30, 30d supply, fill #4
  Filled 2023-03-01: qty 30, 30d supply, fill #5

## 2022-04-08 NOTE — Addendum Note (Signed)
Addended by: Jacobo Forest D on: 04/08/2022 03:47 PM   Modules accepted: Orders

## 2022-04-08 NOTE — Telephone Encounter (Signed)
Reached pt to discuss monitor results. She was taking Metoprolol 25 while she was wearing the monitor. Per Dr. Wendy Poet note will increase to Metoprolol Succinate '50mg'$  q day. Pt agreed and had no further questions or concerns.

## 2022-04-19 ENCOUNTER — Other Ambulatory Visit (HOSPITAL_BASED_OUTPATIENT_CLINIC_OR_DEPARTMENT_OTHER): Payer: Self-pay

## 2022-04-22 ENCOUNTER — Other Ambulatory Visit (HOSPITAL_BASED_OUTPATIENT_CLINIC_OR_DEPARTMENT_OTHER): Payer: Self-pay

## 2022-04-26 ENCOUNTER — Telehealth: Payer: Self-pay | Admitting: Cardiology

## 2022-04-26 ENCOUNTER — Ambulatory Visit: Payer: 59 | Admitting: Cardiology

## 2022-04-26 ENCOUNTER — Encounter: Payer: Self-pay | Admitting: Cardiology

## 2022-04-26 VITALS — BP 110/80 | HR 65 | Ht 64.0 in | Wt 141.0 lb

## 2022-04-26 DIAGNOSIS — R002 Palpitations: Secondary | ICD-10-CM | POA: Diagnosis not present

## 2022-04-26 DIAGNOSIS — E118 Type 2 diabetes mellitus with unspecified complications: Secondary | ICD-10-CM | POA: Diagnosis not present

## 2022-04-26 DIAGNOSIS — E782 Mixed hyperlipidemia: Secondary | ICD-10-CM | POA: Diagnosis not present

## 2022-04-26 NOTE — Patient Instructions (Signed)
Medication Instructions:  Your physician recommends that you continue on your current medications as directed. Please refer to the Current Medication list given to you today.  *If you need a refill on your cardiac medications before your next appointment, please call your pharmacy*   Lab Work: NONE If you have labs (blood work) drawn today and your tests are completely normal, you will receive your results only by: [place] (if you have MyChart) OR A paper copy in the mail If you have any lab test that is abnormal or we need to change your treatment, we will call you to review the results.   Testing/Procedures: We will order CT coronary calcium score. It will cost $99.00 and is not covered by insurance.  Please call to schedule.    [facility]  [address] [place]  [place], [place] [address] [phone]            Or [facility] [address] [place], [place] [address] [phone]    Follow-Up: At [facility], you and your health needs are our priority.  As part of our continuing mission to provide you with exceptional heart care, we have created designated Provider Care Teams.  These Care Teams include your primary Cardiologist (physician) and Advanced Practice Providers (APPs -  Physician Assistants and Nurse Practitioners) who all work together to provide you with the care you need, when you need it.  We recommend signing up for the patient portal called "MyChart".  Sign up information is provided on this After Visit Summary.  MyChart is used to connect with patients for Virtual Visits (Telemedicine).  Patients are able to view lab/test results, encounter notes, upcoming appointments, etc.  Non-urgent messages can be sent to your provider as well.   To learn more about what you can do with MyChart, go to [email or web address].    Your next appointment:   9 month(s)  The format for your next appointment:   In Person  Provider:   Jyl Heinz, MD    Other Instructions   Important Information About Sugar

## 2022-04-26 NOTE — Progress Notes (Signed)
Cardiology Office Note:    Date:  04/26/2022   ID:  [name], DOB November 22, 1970, MRN [id number]  PCP:  Mosie Lukes, MD  Cardiologist:  Jenean Lindau, MD   Referring MD: Mosie Lukes, MD    ASSESSMENT:    1. Mixed hyperlipidemia   2. Type 2 diabetes mellitus with complication, without long-term current use of insulin (HCC)   3. Palpitations    PLAN:    In order of problems listed above:  Primary prevention stressed with patient.  Importance of compliance with diet medication stressed and she vocalized understanding.  Her husband accompanied her for this visit. Mixed dyslipidemia: Diet was emphasized.  Lifestyle modification urged.  Goals were discussed Diabetes mellitus: Diet controlled and I discussed her numbers with her.  For restratification I discussed calcium scoring and she is agreeable. Palpitations: These have resolved.  Beta-blockers have helped for most part.  Reports of echo and monitoring were discussed with her and questions were answered to her satisfaction. Patient will be seen in follow-up appointment in 6 months or earlier if the patient has any concerns    Medication Adjustments/Labs and Tests Ordered: Current medicines are reviewed at length with the patient today.  Concerns regarding medicines are outlined above.  Orders Placed This Encounter  Procedures   CT CARDIAC SCORING (SELF PAY ONLY)   EKG 12-Lead   No orders of the defined types were placed in this encounter.    Chief Complaint  Patient presents with   Atrial Fibrillation     History of Present Illness:    Vickie Thompson is a 51 y.o. female.  Patient has past medical history of mixed dyslipidemia, and I discussed numbers with her diabetes mellitus and palpitations.  She was evaluated for this.  She had frequent PVCs and therefore her beta-blockers were dropped from Toprol-XL 25 to 40 mg daily.  She is feeling somewhat better.  No chest pain orthopnea or PND.  At the time of my  evaluation, the patient is alert awake oriented and in no distress.  Echocardiogram was unremarkable.  Past Medical History:  Diagnosis Date   Contraceptive management 07/13/2015   Contraceptive management 07/13/2015   Contraceptive management 07/13/2015   Menarche at 17 Regular and moderate flow No history of abnormal pap in past G2P2, s/p 2 SVD No history of abnormal MGM No concerns today No gyn surgeries LMP 12/15/2015, normal, 5 days    GERD (gastroesophageal reflux disease)    H/O gestational diabetes mellitus, not currently pregnant    Hemorrhoid    Hyperglycemia 05/03/2017   Hyperlipidemia    Preventative health care 01/05/2016   Type 2 diabetes mellitus with complication, without long-term current use of insulin ([place]) 01/14/2022    No past surgical history on file.  Current Medications: Current Meds  Medication Sig   Ascorbic Acid (VITAMIN C PO) Take by mouth.   desogestrel-ethinyl estradiol (PIMTREA) 0.15-0.02/0.01 MG (21/5) tablet Take 1 tablet by mouth daily.   famotidine (PEPCID) 40 MG tablet Take 1 tablet (40 mg total) by mouth at bedtime.   ibuprofen (ADVIL) 800 MG tablet Take 800 mg by mouth 2 (two) times daily as needed.   meloxicam (MOBIC) 15 MG tablet Take 1 tablet (15 mg total) by mouth daily as needed for pain.   metFORMIN (GLUCOPHAGE) 500 MG tablet Take 1 tablet (500 mg total) by mouth daily with breakfast.   metoprolol succinate (TOPROL-XL) 50 MG 24 hr tablet Take 1 tablet (50 mg total)  by mouth daily. Take with or immediately following a meal.   OVER THE COUNTER MEDICATION Take 1 tablet by mouth every other day. Magnesium powder nightly   rosuvastatin (CRESTOR) 40 MG tablet Take 1 tablet (40 mg total) by mouth daily.   Zinc Acetate, Oral, (ZINC ACETATE PO) Take 1 tablet by mouth daily.     Allergies:   Retinoids and Sudafed [pseudoephedrine hcl]   Social History   Socioeconomic History   Marital status: Married    Spouse name: Not on file   Number of children:  2   Years of education: Not on file   Highest education level: Not on file  Occupational History   Not on file  Tobacco Use   Smoking status: Never   Smokeless tobacco: Never  Vaping Use   Vaping Use: Never used  Substance and Sexual Activity   Alcohol use: Yes    Comment: occasionally   Drug use: No   Sexual activity: Not on file  Other Topics Concern   Not on file  Social History Narrative   Not on file   Social Determinants of Health   Financial Resource Strain: Not on file  Food Insecurity: Not on file  Transportation Needs: Not on file  Physical Activity: Not on file  Stress: Not on file  Social Connections: Not on file     Family History: The patient's family history includes Cancer in her maternal aunt; Diabetes in her father; Gout in her brother; Hypertension in her father and sister; Mental illness in her mother. There is no history of Colon cancer, Esophageal cancer, Rectal cancer, or Stomach cancer.  ROS:   Please see the history of present illness.    All other systems reviewed and are negative.  EKGs/Labs/Other Studies Reviewed:    The following studies were reviewed today: EKG reveals sinus rhythm and nonspecific ST-T changes   Recent Labs: 10/29/2021: ALT 24; BUN 12; Creatinine, Ser 0.74; Hemoglobin 13.8; Platelets 273.0; Potassium 3.8; Sodium 139; TSH 3.01  Recent Lipid Panel    Component Value Date/Time   CHOL 192 10/29/2021 0914   TRIG 203.0 (H) 10/29/2021 0914   HDL 67.70 10/29/2021 0914   CHOLHDL 3 10/29/2021 0914   VLDL 40.6 (H) 10/29/2021 0914   LDLCALC 73 08/10/2017 0801   LDLDIRECT 99.0 10/29/2021 0914    Physical Exam:    VS:  BP 110/80   Pulse 65   Ht '5\' 4"'$  (1.626 m)   Wt 141 lb (64 kg)   SpO2 97%   BMI 24.20 kg/m     Wt Readings from Last 3 Encounters:  04/26/22 141 lb (64 kg)  01/14/22 138 lb (62.6 kg)  12/01/21 143 lb 3.2 oz (65 kg)     GEN: Patient is in no acute distress HEENT: Normal NECK: No JVD; No carotid  bruits LYMPHATICS: No lymphadenopathy CARDIAC: Hear sounds regular, 2/6 systolic murmur at the apex. RESPIRATORY:  Clear to auscultation without rales, wheezing or rhonchi  ABDOMEN: Soft, non-tender, non-distended MUSCULOSKELETAL:  No edema; No deformity  SKIN: Warm and dry NEUROLOGIC:  Alert and oriented x 3 PSYCHIATRIC:  Normal affect   Signed, Jenean Lindau, MD  04/26/2022 1:35 PM    [facility]

## 2022-04-26 NOTE — Telephone Encounter (Signed)
Patient c/o Palpitations:  High priority if patient c/o lightheadedness, shortness of breath, or chest pain  How long have you had palpitations/irregular HR/ Afib? Started last night    Are you having the symptoms now? yes  Are you currently experiencing lightheadedness, SOB or CP? no  Do you have a history of afib (atrial fibrillation) or irregular heart rhythm? yes  Have you checked your BP or HR? (document readings if available): patient just checks her radial pulse and can feel her heart skipping a beat   Are you experiencing any other symptoms? No  Patient was hoping to have Dr. Raliegh Ip check her out today

## 2022-04-26 NOTE — Telephone Encounter (Signed)
Pt called back and stated that she would be willing to see another physician in the [place] office. Sent to Enrigue Catena to call for appt today with Dr. Geraldo Pitter.

## 2022-04-26 NOTE — Telephone Encounter (Signed)
Called pt to discuss message left. LVM

## 2022-05-03 ENCOUNTER — Ambulatory Visit (HOSPITAL_BASED_OUTPATIENT_CLINIC_OR_DEPARTMENT_OTHER)
Admission: RE | Admit: 2022-05-03 | Discharge: 2022-05-03 | Disposition: A | Discharge status: Home / Self Care | Payer: 59 | Source: Ambulatory Visit | Attending: Cardiology | Admitting: Cardiology

## 2022-05-03 ENCOUNTER — Encounter (HOSPITAL_BASED_OUTPATIENT_CLINIC_OR_DEPARTMENT_OTHER): Payer: Self-pay | Admitting: Radiology

## 2022-05-03 DIAGNOSIS — R002 Palpitations: Secondary | ICD-10-CM | POA: Insufficient documentation

## 2022-05-03 DIAGNOSIS — E782 Mixed hyperlipidemia: Secondary | ICD-10-CM | POA: Insufficient documentation

## 2022-05-03 DIAGNOSIS — E118 Type 2 diabetes mellitus with unspecified complications: Secondary | ICD-10-CM | POA: Insufficient documentation

## 2022-05-05 ENCOUNTER — Ambulatory Visit: Payer: 59 | Admitting: Cardiology

## 2022-05-06 ENCOUNTER — Telehealth (INDEPENDENT_AMBULATORY_CARE_PROVIDER_SITE_OTHER): Payer: 59 | Admitting: Family

## 2022-05-06 ENCOUNTER — Telehealth: Payer: Self-pay

## 2022-05-06 ENCOUNTER — Other Ambulatory Visit (HOSPITAL_BASED_OUTPATIENT_CLINIC_OR_DEPARTMENT_OTHER): Payer: Self-pay

## 2022-05-06 VITALS — BP 92/53 | HR 97

## 2022-05-06 DIAGNOSIS — R110 Nausea: Secondary | ICD-10-CM

## 2022-05-06 MED ORDER — ONDANSETRON 8 MG PO TBDP
8.0000 mg | ORAL_TABLET | Freq: Three times a day (TID) | ORAL | 0 refills | Status: DC | PRN
  Filled 2022-05-06: qty 20, 7d supply, fill #0

## 2022-05-06 NOTE — Progress Notes (Signed)
Vickie Thompson is a 51 y.o. female with the following history as recorded in [facility]:  Patient Active Problem List   Diagnosis Date Noted   Palpitations 01/14/2022   Type 2 diabetes mellitus with complication, without long-term current use of insulin ([place]) 01/14/2022   Acid reflux 09/16/2019   Left wrist pain 08/19/2019   Right elbow pain 08/19/2019   Rectal bleeding 06/04/2018   Knee pain 06/04/2018   Hyperglycemia 05/03/2017   Lateral epicondylitis, left elbow 01/12/2017   Preventative health care 01/05/2016   Contraceptive management 07/13/2015   Vitamin D deficiency 02/20/2012   Hyperlipidemia    H/O gestational diabetes mellitus, not currently pregnant     Current Outpatient Medications  Medication Sig Dispense Refill   Ascorbic Acid (VITAMIN C PO) Take by mouth.     desogestrel-ethinyl estradiol (PIMTREA) 0.15-0.02/0.01 MG (21/5) tablet Take 1 tablet by mouth daily. 84 tablet 3   famotidine (PEPCID) 40 MG tablet Take 1 tablet (40 mg total) by mouth at bedtime. 90 tablet 0   ibuprofen (ADVIL) 800 MG tablet Take 800 mg by mouth 2 (two) times daily as needed.     meloxicam (MOBIC) 15 MG tablet Take 1 tablet (15 mg total) by mouth daily as needed for pain. 30 tablet 0   metFORMIN (GLUCOPHAGE) 500 MG tablet Take 1 tablet (500 mg total) by mouth daily with breakfast. 90 tablet 1   metoprolol succinate (TOPROL-XL) 50 MG 24 hr tablet Take 1 tablet (50 mg total) by mouth daily. Take with or immediately following a meal. 90 tablet 3   ondansetron (ZOFRAN-ODT) 8 MG disintegrating tablet Take 1 tablet (8 mg total) by mouth every 8 (eight) hours as needed for nausea or vomiting. 20 tablet 0   OVER THE COUNTER MEDICATION Take 1 tablet by mouth every other day. Magnesium powder nightly     rosuvastatin (CRESTOR) 40 MG tablet Take 1 tablet (40 mg total) by mouth daily. 90 tablet 1   Zinc Acetate, Oral, (ZINC ACETATE PO) Take 1 tablet by mouth daily.     No current facility-administered  medications for this visit.    Allergies: Retinoids and Sudafed [pseudoephedrine hcl]  Past Medical History:  Diagnosis Date   Contraceptive management 07/13/2015   Contraceptive management 07/13/2015   Contraceptive management 07/13/2015   Menarche at 17 Regular and moderate flow No history of abnormal pap in past G2P2, s/p 2 SVD No history of abnormal MGM No concerns today No gyn surgeries LMP 12/15/2015, normal, 5 days    GERD (gastroesophageal reflux disease)    H/O gestational diabetes mellitus, not currently pregnant    Hemorrhoid    Hyperglycemia 05/03/2017   Hyperlipidemia    Preventative health care 01/05/2016   Type 2 diabetes mellitus with complication, without long-term current use of insulin ([place]) 01/14/2022    No past surgical history on file.  Family History  Problem Relation Age of Onset   Mental illness Mother        depression   Diabetes Father    Hypertension Father    Hypertension Sister    Gout Brother    Cancer Maternal Aunt        lung   Colon cancer Neg Hx    Esophageal cancer Neg Hx    Rectal cancer Neg Hx    Stomach cancer Neg Hx     Social History   Tobacco Use   Smoking status: Never   Smokeless tobacco: Never  Substance Use Topics   Alcohol use:  Yes    Comment: occasionally    Subjective:   I connected with Vickie Thompson on 05/06/22 at  3:20 PM EDT by a telephone call and verified that I am speaking with the correct person using two identifiers.   I discussed the limitations of evaluation and management by telemedicine and the availability of in person appointments. The patient expressed understanding and agreed to proceed. Provider in office/ patient is at home; provider and patient are only 2 people on telephone call.   Notes that she is concerned she may have had some "bad seafood." Started yesterday with nausea/ abdominal cramping; notes that husband has had similar symptoms and did eat same food; no fever or diarrhea; has been able to keep  eggs, jello- and Ginger Ale down today;     Objective:  Vitals:   05/06/22 1510  BP: (!) 92/53  Pulse: 97    Lungs: Respirations unlabored;  Neurologic: Alert and oriented; speech intact;   Assessment:  1. Nausea     Plan:  Suspect food borne source; BRAT diet discussed; Rx for Zofran 8 mg q 8 hours as needed; follow up for in person visit if symptoms persist.  Time spent 11 minutes   No follow-ups on file.  No orders of the defined types were placed in this encounter.   Requested Prescriptions   Signed Prescriptions Disp Refills   ondansetron (ZOFRAN-ODT) 8 MG disintegrating tablet 20 tablet 0    Sig: Take 1 tablet (8 mg total) by mouth every 8 (eight) hours as needed for nausea or vomiting.

## 2022-05-19 ENCOUNTER — Telehealth: Payer: Self-pay | Admitting: Family Medicine

## 2022-05-19 ENCOUNTER — Other Ambulatory Visit (HOSPITAL_BASED_OUTPATIENT_CLINIC_OR_DEPARTMENT_OTHER): Payer: Self-pay

## 2022-05-19 ENCOUNTER — Other Ambulatory Visit: Payer: Self-pay | Admitting: Family Medicine

## 2022-05-19 NOTE — Telephone Encounter (Signed)
Patient would like to schedule her shingles shot. Please place order.

## 2022-05-20 ENCOUNTER — Other Ambulatory Visit (HOSPITAL_BASED_OUTPATIENT_CLINIC_OR_DEPARTMENT_OTHER): Payer: Self-pay

## 2022-05-20 MED ORDER — METFORMIN HCL 500 MG PO TABS
500.0000 mg | ORAL_TABLET | Freq: Every day | ORAL | 1 refills | Status: DC
  Filled 2022-05-20: qty 90, 90d supply, fill #0
  Filled 2022-09-06: qty 90, 90d supply, fill #1

## 2022-06-09 ENCOUNTER — Ambulatory Visit (INDEPENDENT_AMBULATORY_CARE_PROVIDER_SITE_OTHER): Payer: 59

## 2022-06-09 DIAGNOSIS — Z23 Encounter for immunization: Secondary | ICD-10-CM

## 2022-06-09 NOTE — Progress Notes (Deleted)
nu

## 2022-06-09 NOTE — Progress Notes (Signed)
Vickie Thompson is a 51 y.o. female presents to the office today for # 1 Shingles injections, per physician's orders. Original order: Dr. Charlett Blake Shingles (med),  (dose),   was administered Left Deltoid (location) today. Patient tolerated injection.  Date due: 2 -6 months, appt made Yes Patient next injection due:  [name]

## 2022-06-10 ENCOUNTER — Other Ambulatory Visit: Payer: Self-pay | Admitting: Family Medicine

## 2022-06-10 ENCOUNTER — Other Ambulatory Visit (HOSPITAL_BASED_OUTPATIENT_CLINIC_OR_DEPARTMENT_OTHER): Payer: Self-pay

## 2022-06-10 ENCOUNTER — Telehealth: Payer: Self-pay | Admitting: Family Medicine

## 2022-06-10 DIAGNOSIS — Z3009 Encounter for other general counseling and advice on contraception: Secondary | ICD-10-CM

## 2022-06-10 MED ORDER — DESOGESTREL-ETHINYL ESTRADIOL 0.15-0.02/0.01 MG (21/5) PO TABS
1.0000 | ORAL_TABLET | Freq: Every day | ORAL | 1 refills | Status: DC
  Filled 2022-06-10: qty 84, 84d supply, fill #0
  Filled 2022-09-06: qty 84, 84d supply, fill #1

## 2022-06-10 NOTE — Telephone Encounter (Signed)
Okay to refill? 

## 2022-06-10 NOTE — Telephone Encounter (Signed)
Medication: desogestrel-ethinyl estradiol (PIMTREA) 0.15-0.02/0.01 MG (21/5) tablet [[id number]]  Has the patient contacted their pharmacy? Yes.   Prescription denied  Preferred Pharmacy (with phone number or street name): [facility]  [address] [place], [place] [address]  Phone:  [phone]  Fax:  [phone]  DEA #:  [id number]  Agent: Please be advised that RX refills may take up to 3 business days. We ask that you follow-up with your pharmacy.

## 2022-06-11 ENCOUNTER — Other Ambulatory Visit (HOSPITAL_BASED_OUTPATIENT_CLINIC_OR_DEPARTMENT_OTHER): Payer: Self-pay

## 2022-06-15 ENCOUNTER — Ambulatory Visit: Payer: 59

## 2022-07-27 NOTE — Telephone Encounter (Signed)
Opened in error

## 2022-07-28 ENCOUNTER — Ambulatory Visit: Payer: 59 | Admitting: Cardiology

## 2022-08-03 ENCOUNTER — Other Ambulatory Visit (HOSPITAL_BASED_OUTPATIENT_CLINIC_OR_DEPARTMENT_OTHER): Payer: Self-pay

## 2022-08-03 ENCOUNTER — Telehealth: Payer: Self-pay | Admitting: Gastroenterology

## 2022-08-03 ENCOUNTER — Other Ambulatory Visit: Payer: Self-pay

## 2022-08-03 DIAGNOSIS — K649 Unspecified hemorrhoids: Secondary | ICD-10-CM

## 2022-08-03 MED ORDER — HYDROCORTISONE ACETATE 25 MG RE SUPP
25.0000 mg | Freq: Two times a day (BID) | RECTAL | 1 refills | Status: DC
  Filled 2022-08-03: qty 12, 6d supply, fill #0

## 2022-08-03 NOTE — Telephone Encounter (Signed)
Patient is requesting to have hemorrhoid banding, please advise.

## 2022-08-03 NOTE — Telephone Encounter (Signed)
Dr. Bryan Lemma, pt was last seen in 03/2021. She has a hx of constipation and hemorrhoids. OK to refill Anusol suppositories?

## 2022-08-03 NOTE — Telephone Encounter (Signed)
Patient scheduled for next available f/u 09/10/22 at 10:20 am . Patient is also inquiring if she can have a medication prescribed in the meantime to help with pain. Patient states to have the prescription sent into [facility] [place]. Please advise.  Thank you

## 2022-08-03 NOTE — Telephone Encounter (Signed)
RX for Anusol suppositories sent to [facility] on file. I left pt a detailed vm letting her know that I sent prescription, in the vm I mentioned [facility] but I actually sent it to [facility].   MyChart message sent to patient as well.

## 2022-08-03 NOTE — Telephone Encounter (Signed)
Returned call to patient. I left pt a detailed vm letting her know that we can get her scheduled for an office visit with Dr. Bryan Lemma for an evaluation and if hemorrhoid banding is appropriate he can do that in the office at the time of her appt. I advised pt to call back so that we can get her scheduled.

## 2022-08-04 ENCOUNTER — Other Ambulatory Visit (HOSPITAL_BASED_OUTPATIENT_CLINIC_OR_DEPARTMENT_OTHER): Payer: Self-pay

## 2022-08-04 NOTE — Telephone Encounter (Signed)
Patient reviewed and responded MyChart message. Last read by [name] at  5:07 PM on 08/03/2022.

## 2022-08-08 ENCOUNTER — Encounter: Payer: Self-pay | Admitting: *Deleted

## 2022-08-08 ENCOUNTER — Ambulatory Visit
Admission: EM | Admit: 2022-08-08 | Discharge: 2022-08-08 | Disposition: A | Discharge status: Home / Self Care | Payer: 59 | Attending: Urgent Care | Admitting: Urgent Care

## 2022-08-08 ENCOUNTER — Ambulatory Visit (INDEPENDENT_AMBULATORY_CARE_PROVIDER_SITE_OTHER): Payer: 59

## 2022-08-08 DIAGNOSIS — M545 Low back pain, unspecified: Secondary | ICD-10-CM

## 2022-08-08 DIAGNOSIS — M25552 Pain in left hip: Secondary | ICD-10-CM | POA: Diagnosis not present

## 2022-08-08 MED ORDER — NAPROXEN 500 MG PO TABS: 500.0000 mg | ORAL_TABLET | Freq: Two times a day (BID) | ORAL | 0 refills | Status: DC

## 2022-08-08 MED ORDER — TIZANIDINE HCL 4 MG PO TABS: 4.0000 mg | ORAL_TABLET | Freq: Every day | ORAL | 0 refills | Status: DC

## 2022-08-08 NOTE — ED Triage Notes (Signed)
Denies injury. C/O non-radiating low back/left upper buttock pain with any movements onset 3 days ago. Pain worse with breathing and ambulations. Has tried IBU without significant relief. Denies parasthesias.

## 2022-08-08 NOTE — ED Provider Notes (Signed)
[facility]  Note:  This document was prepared using Systems analyst and may include unintentional dictation errors.  MRN: [id number] DOB: Jun 29, 1971  Subjective:   Vickie Thompson is a 51 y.o. female presenting for 3-day history of persistent low back pain that radiates across the posterior hip.  Patient has pain with any kind of movement at that level of her hip and back.  No fall, trauma, numbness or tingling, saddle paresthesia, changes to bowel or urinary habits, radicular symptoms.  No rashes.  No history of musculoskeletal disorders.  Does not do a lot of heavy lifting and does not put a lot of strain on her back.  No current facility-administered medications for this encounter.  Current Outpatient Medications:    desogestrel-ethinyl estradiol (PIMTREA) 0.15-0.02/0.01 MG (21/5) tablet, Take 1 tablet by mouth daily., Disp: 84 tablet, Rfl: 1   ibuprofen (ADVIL) 800 MG tablet, Take 800 mg by mouth 2 (two) times daily as needed., Disp: , Rfl:    metFORMIN (GLUCOPHAGE) 500 MG tablet, Take 1 tablet (500 mg total) by mouth daily with breakfast., Disp: 90 tablet, Rfl: 1   metoprolol succinate (TOPROL-XL) 50 MG 24 hr tablet, Take 1 tablet (50 mg total) by mouth daily. Take with or immediately following a meal., Disp: 90 tablet, Rfl: 3   rosuvastatin (CRESTOR) 40 MG tablet, Take 1 tablet (40 mg total) by mouth daily., Disp: 90 tablet, Rfl: 1   Ascorbic Acid (VITAMIN C PO), Take by mouth., Disp: , Rfl:    famotidine (PEPCID) 40 MG tablet, Take 1 tablet (40 mg total) by mouth at bedtime., Disp: 90 tablet, Rfl: 0   hydrocortisone (ANUSOL-HC) 25 MG suppository, Unwrap and insert 1 suppository (25 mg total) rectally 2 (two) times daily., Disp: 12 suppository, Rfl: 1   meloxicam (MOBIC) 15 MG tablet, Take 1 tablet (15 mg total) by mouth daily as needed for pain., Disp: 30 tablet, Rfl: 0   ondansetron (ZOFRAN-ODT) 8 MG disintegrating tablet, Take 1 tablet (8 mg  total) by mouth every 8 (eight) hours as needed for nausea or vomiting., Disp: 20 tablet, Rfl: 0   OVER THE COUNTER MEDICATION, Take 1 tablet by mouth every other day. Magnesium powder nightly, Disp: , Rfl:    Zinc Acetate, Oral, (ZINC ACETATE PO), Take 1 tablet by mouth daily., Disp: , Rfl:    Allergies  Allergen Reactions   Retinoids Shortness Of Breath   Sudafed [Pseudoephedrine Hcl] Palpitations    Past Medical History:  Diagnosis Date   Contraceptive management 07/13/2015   Contraceptive management 07/13/2015   Contraceptive management 07/13/2015   Menarche at 17 Regular and moderate flow No history of abnormal pap in past G2P2, s/p 2 SVD No history of abnormal MGM No concerns today No gyn surgeries LMP 12/15/2015, normal, 5 days    GERD (gastroesophageal reflux disease)    H/O gestational diabetes mellitus, not currently pregnant    Hemorrhoid    Hyperglycemia 05/03/2017   Hyperlipidemia    Preventative health care 01/05/2016   Type 2 diabetes mellitus with complication, without long-term current use of insulin ([place]) 01/14/2022     Past Surgical History:  Procedure Laterality Date   NO PAST SURGERIES      Family History  Problem Relation Age of Onset   Mental illness Mother        depression   Diabetes Father    Hypertension Father    Hypertension Sister    Gout Brother  Cancer Maternal Aunt        lung   Colon cancer Neg Hx    Esophageal cancer Neg Hx    Rectal cancer Neg Hx    Stomach cancer Neg Hx     Social History   Tobacco Use   Smoking status: Never   Smokeless tobacco: Never  Vaping Use   Vaping Use: Never used  Substance Use Topics   Alcohol use: Yes    Comment: occasionally   Drug use: No    ROS   Objective:   Vitals: BP (!) 147/79   Pulse 75   Temp 98.3 F (36.8 C) (Oral)   Resp 18   LMP 07/24/2022 (Approximate)   SpO2 97%   Physical Exam Constitutional:      General: She is not in acute distress.    Appearance: Normal  appearance. She is well-developed. She is not ill-appearing, toxic-appearing or diaphoretic.  HENT:     Head: Normocephalic and atraumatic.     Nose: Nose normal.     Mouth/Throat:     Mouth: Mucous membranes are moist.  Eyes:     General: No scleral icterus.       Right eye: No discharge.        Left eye: No discharge.     Extraocular Movements: Extraocular movements intact.  Cardiovascular:     Rate and Rhythm: Normal rate.  Pulmonary:     Effort: Pulmonary effort is normal.  Musculoskeletal:     Lumbar back: Tenderness (over area outlined) present. No swelling, edema, deformity, signs of trauma, lacerations, spasms or bony tenderness. Normal range of motion. Negative right straight leg raise test and negative left straight leg raise test. No scoliosis.       Back:  Skin:    General: Skin is warm and dry.  Neurological:     General: No focal deficit present.     Mental Status: She is alert and oriented to person, place, and time.  Psychiatric:        Mood and Affect: Mood normal.        Behavior: Behavior normal.     DG Hip Unilat With Pelvis 2-3 Views Left  Result Date: 08/08/2022 CLINICAL DATA:  Nonradiating low back and left buttock pain. EXAM: DG HIP (WITH OR WITHOUT PELVIS) 2-3V LEFT COMPARISON:  None Available. FINDINGS: There is no evidence of hip fracture or dislocation. There is no evidence of arthropathy or other focal bone abnormality. IMPRESSION: Negative. Electronically Signed   By: Misty Stanley M.D.   On: 08/08/2022 09:55     Assessment and Plan :   PDMP not reviewed this encounter.  1. Left hip pain   2. Acute left-sided low back pain without sciatica     Recommended conservative management for inflammatory type musculoskeletal pain with naproxen, tizanidine. Counseled patient on potential for adverse effects with medications prescribed/recommended today, ER and return-to-clinic precautions discussed, patient verbalized understanding.    Jaynee Eagles,  PA-C 08/08/22 1043

## 2022-09-06 ENCOUNTER — Other Ambulatory Visit (HOSPITAL_BASED_OUTPATIENT_CLINIC_OR_DEPARTMENT_OTHER): Payer: Self-pay

## 2022-09-06 ENCOUNTER — Other Ambulatory Visit: Payer: Self-pay | Admitting: Family Medicine

## 2022-09-06 MED ORDER — ROSUVASTATIN CALCIUM 40 MG PO TABS
40.0000 mg | ORAL_TABLET | Freq: Every day | ORAL | 0 refills | Status: DC
  Filled 2022-09-06: qty 90, 90d supply, fill #0

## 2022-09-10 ENCOUNTER — Ambulatory Visit: Payer: 59 | Admitting: Gastroenterology

## 2022-10-06 ENCOUNTER — Other Ambulatory Visit (HOSPITAL_BASED_OUTPATIENT_CLINIC_OR_DEPARTMENT_OTHER): Payer: Self-pay

## 2022-10-06 MED ORDER — CYCLOBENZAPRINE HCL 10 MG PO TABS
10.0000 mg | ORAL_TABLET | Freq: Two times a day (BID) | ORAL | 0 refills | Status: DC | PRN
  Filled 2022-10-06: qty 14, 7d supply, fill #0

## 2022-10-10 IMAGING — MG MM DIGITAL SCREENING BILAT W/ TOMO AND CAD
8 series · 9 of 24 positions shown · non-contrast
Comparison: Previous exam(s).

CLINICAL DATA: Screening.

EXAM:
DIGITAL SCREENING BILATERAL MAMMOGRAM WITH TOMOSYNTHESIS AND CAD
TECHNIQUE: Bilateral screening digital craniocaudal and mediolateral oblique
mammograms were obtained. Bilateral screening digital breast
tomosynthesis was performed. The images were evaluated with
computer-aided detection.

[L CC synth-2D]
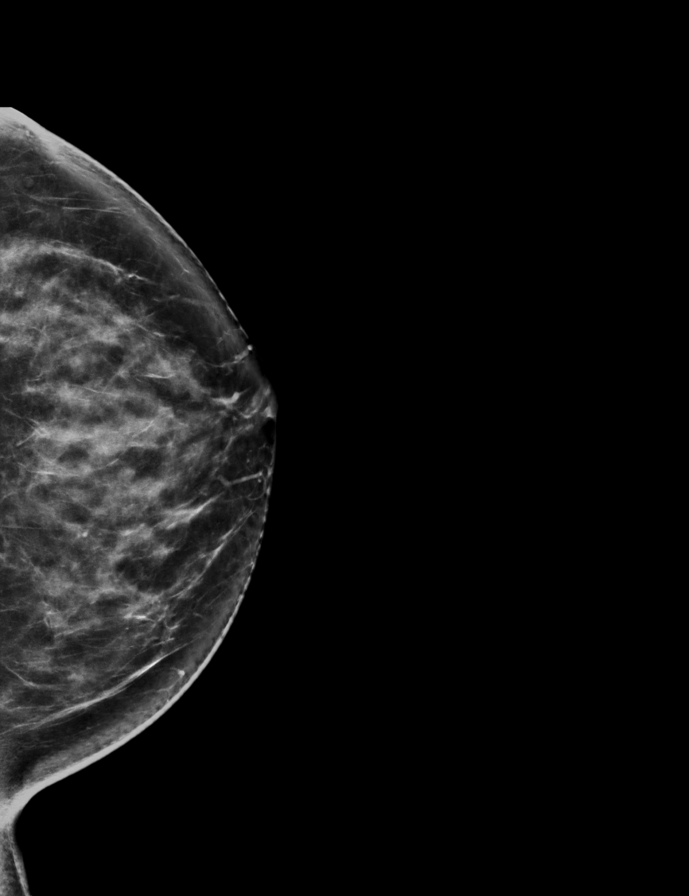

[L MLO synth-2D]
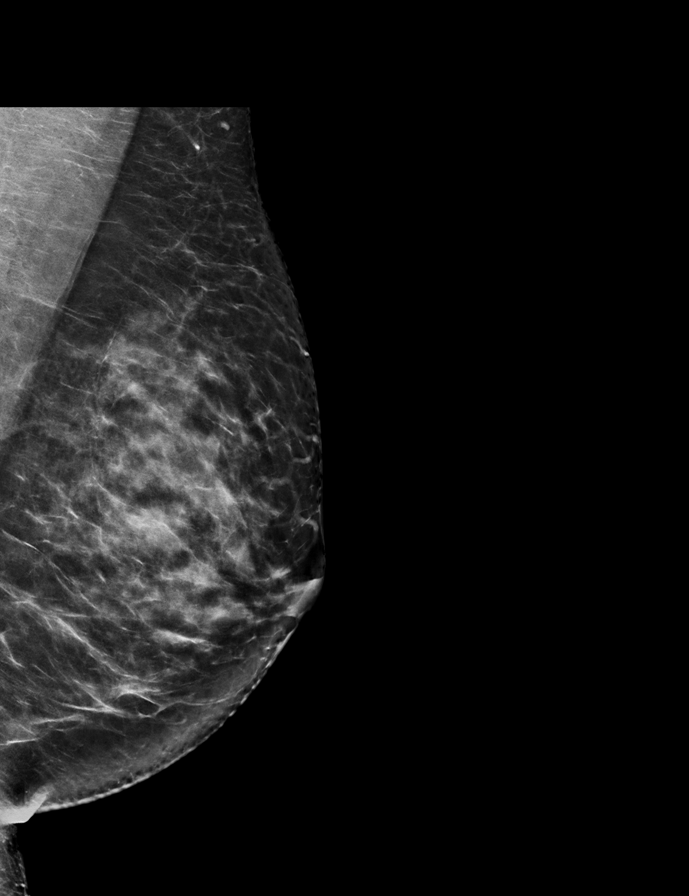

[R MLO synth-2D]
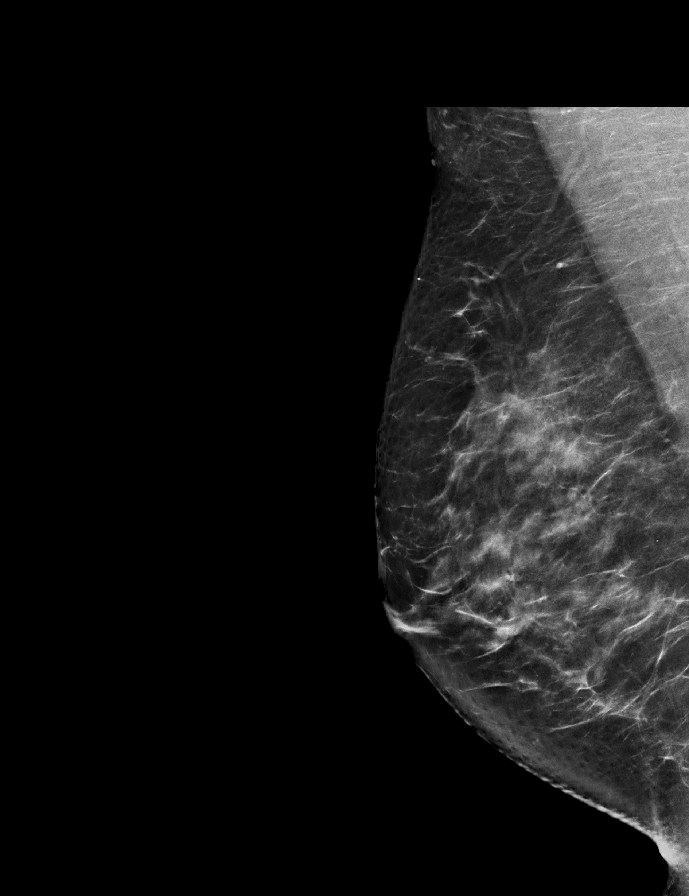

[R CC synth-2D]
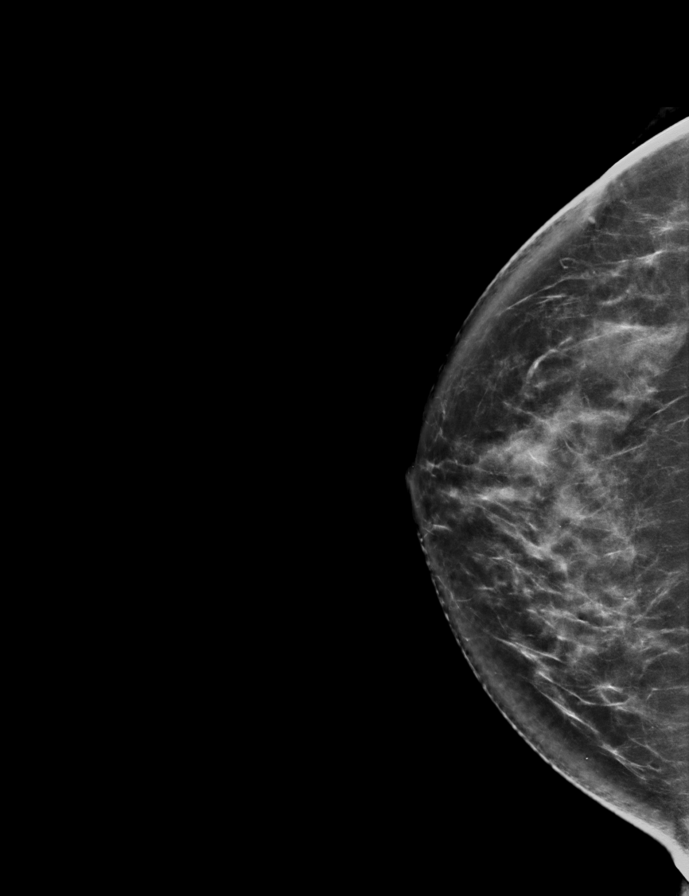

[L CC tomo · 2 of 75 frames shown]
[frame 25/75]
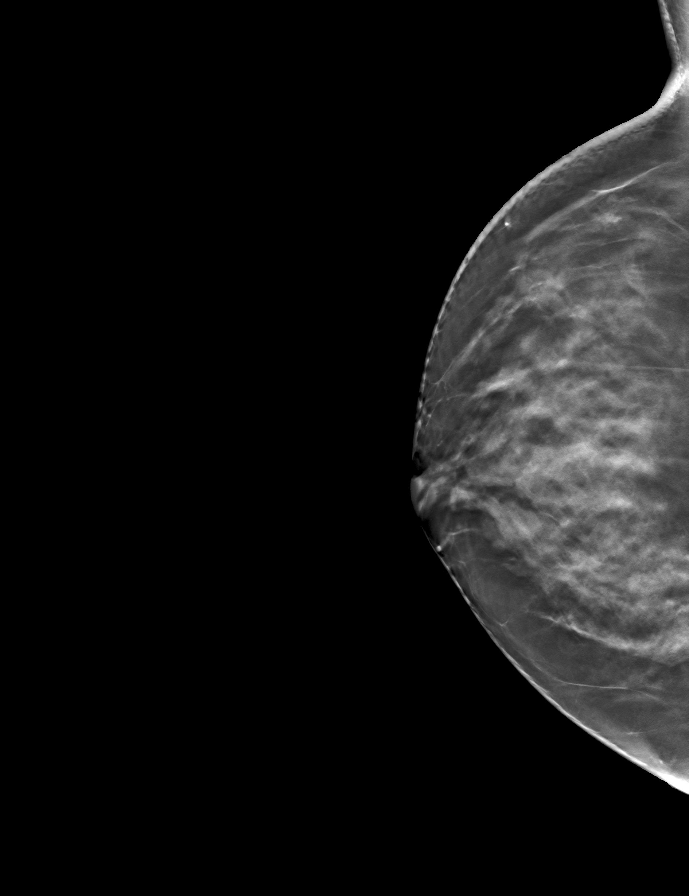
[frame 38/75]
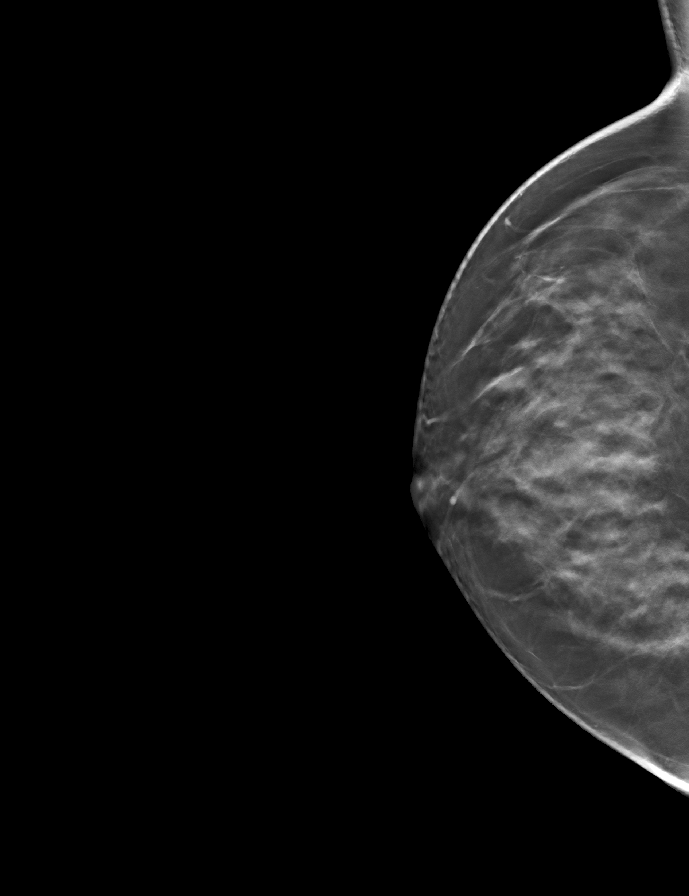

[L MLO tomo · tomo slice 38/75.0]
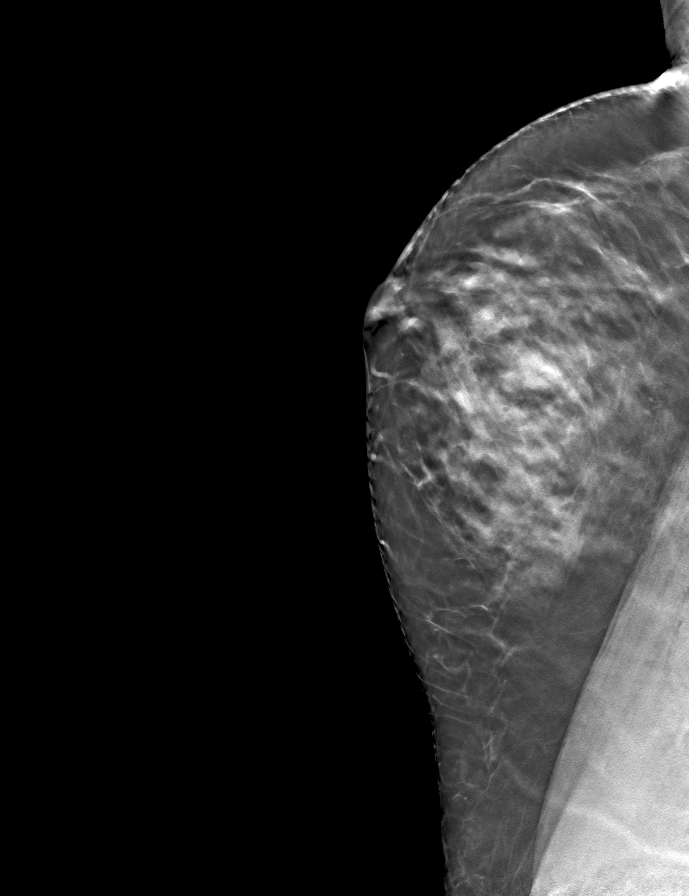

[R CC tomo · tomo slice 40/79.0]
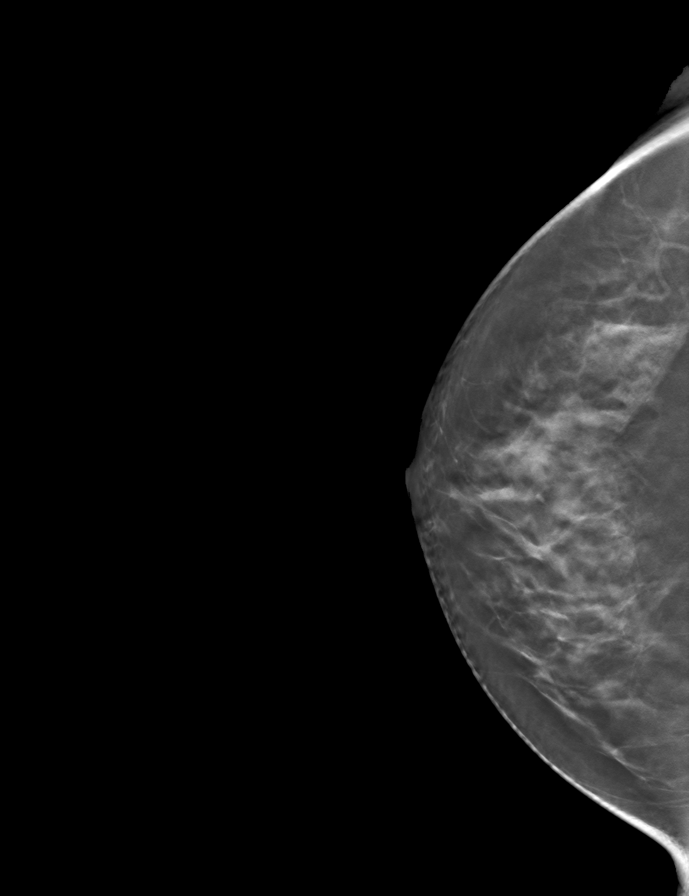

[R MLO tomo · tomo slice 39/78.0]
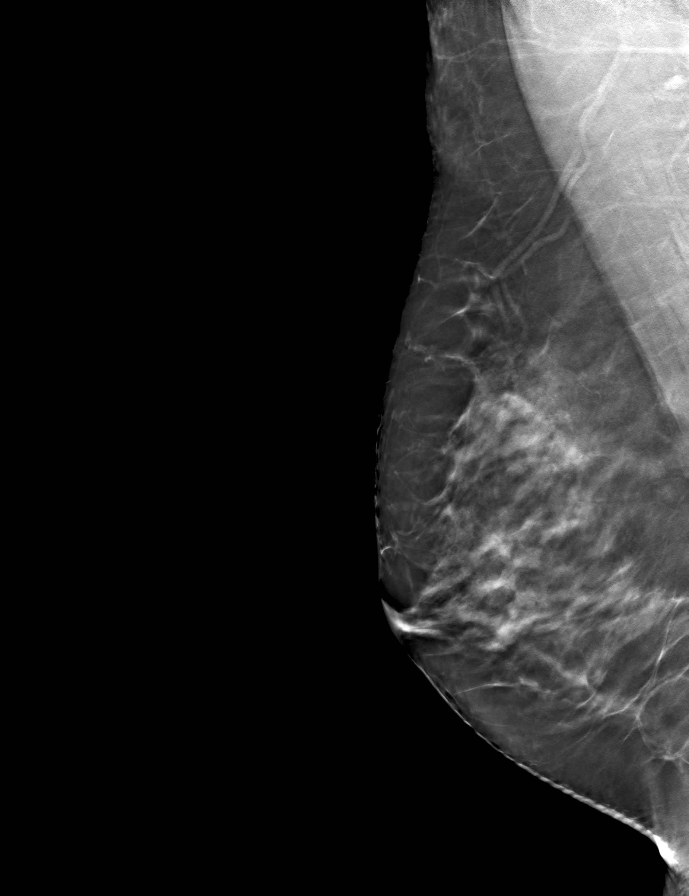

[9 of 24 positions shown; findings below may reference images not displayed]

ACR Breast Density Category c: The breast tissue is heterogeneously
dense, which may obscure small masses.
FINDINGS: There are no findings suspicious for malignancy.
IMPRESSION: No mammographic evidence of malignancy. A result letter of this
screening mammogram will be mailed directly to the patient.

RECOMMENDATION:
Screening mammogram in one year. (Code:Q3-W-BC3)

BI-RADS CATEGORY  1: Negative.

## 2022-11-02 ENCOUNTER — Encounter: Payer: Self-pay | Admitting: Gastroenterology

## 2022-11-02 ENCOUNTER — Ambulatory Visit: Payer: 59 | Admitting: Gastroenterology

## 2022-11-02 VITALS — BP 120/78 | HR 76 | Ht 64.0 in | Wt 144.0 lb

## 2022-11-02 DIAGNOSIS — K641 Second degree hemorrhoids: Secondary | ICD-10-CM

## 2022-11-02 DIAGNOSIS — Z860100 Personal history of colon polyps, unspecified: Secondary | ICD-10-CM

## 2022-11-02 DIAGNOSIS — M6289 Other specified disorders of muscle: Secondary | ICD-10-CM

## 2022-11-02 NOTE — Patient Instructions (Addendum)
We have placed a referral for Pelvic Floor therapy.  Follow up as needed.  HEMORRHOID BANDING PROCEDURE    FOLLOW-UP CARE   The procedure you have had should have been relatively painless since the banding of the area involved does not have nerve endings and there is no pain sensation.  The rubber band cuts off the blood supply to the hemorrhoid and the band may fall off as soon as 48 hours after the banding (the band may occasionally be seen in the toilet bowl following a bowel movement). You may notice a temporary feeling of fullness in the rectum which should respond adequately to plain Tylenol or Motrin.  Following the banding, avoid strenuous exercise that evening and resume full activity the next day.  A sitz bath (soaking in a warm tub) or bidet is soothing, and can be useful for cleansing the area after bowel movements.     To avoid constipation, take two tablespoons of natural wheat bran, natural oat bran, flax, Benefiber or any over the counter fiber supplement and increase your water intake to 7-8 glasses daily.    Unless you have been prescribed anorectal medication, do not put anything inside your rectum for two weeks: No suppositories, enemas, fingers, etc.  Occasionally, you may have more bleeding than usual after the banding procedure.  This is often from the untreated hemorrhoids rather than the treated one.  Don't be concerned if there is a tablespoon or so of blood.  If there is more blood than this, lie flat with your bottom higher than your head and apply an ice pack to the area. If the bleeding does not stop within a half an hour or if you feel faint, call our office at [phone] or go to the emergency room.  Problems are not common; however, if there is a substantial amount of bleeding, severe pain, chills, fever or difficulty passing urine (very rare) or other problems, you should call us at [phone] or report to the nearest emergency room.  Do not stay  seated continuously for more than 2-3 hours for a day or two after the procedure.  Tighten your buttock muscles 10-15 times every two hours and take 10-15 deep breaths every 1-2 hours.  Do not spend more than a few minutes on the toilet if you cannot empty your bowel; instead re-visit the toilet at a later time.     Thank you for choosing me and [facility].  Vito Cirigliano, D.O.

## 2022-11-02 NOTE — Progress Notes (Signed)
Chief Complaint:    Symptomatic Internal Hemorrhoids; Hemorrhoid Band Ligation  GI History: 52 year old female with symptomatic hemorrhoids since 2008, unresponsive to prior medical management.   - 08/25/18: Banded RA hemorrhoid - 09/19/18: Banded LL hemorrhoid - 10/04/18: Banded RP hemorrhoid - 04/02/2021: Banded RP hemorrhoid  She had clinical improvement after completion of hemorrhoid banding series in 2019, then recurrence of hemorrhoidal symptoms in 03/2021.   Endoscopic history: -Colonoscopy (07/28/2018, Dr. Bryan Lemma): 3 mm sessile serrated polyp in the cecum, 1 mm polyp in the rectum, medium-size internal hemorrhoids, normal terminal ileum.  Recommended repeat in 5 years surveillance.  HPI:     Patient is a 52 y.o. female with a history of symptomatic internal hemorrhoids presenting to the [facility]ology [facility] for follow-up.  Completed hemorrhoid banding series in 2019 as outlined above with excellent clinical response.  However, recurrence of hemorrhoidal symptoms in 03/2021 (episodic hemorrhoid prolapse with constipation/straining without bleeding).  Exam with single hemorrhoid column with prolapse and RP position that was again banded.  Had done well clinically until recent recurrence of symptoms in 07/2022.  Tried treating with Anusol, but presents today due to continued intermittent symptomatology (itching, irritation; no bleeding).  She would like reevaluation and repeat hemorrhoid banding today.  Had additionally suspected pelvic floor dyssynergia and placed referral for ARM (not completed).  Still has to strain to have BM, again described as "pushing against a brick wall".  No change in medical or surgical history, medications, allergies, social history since last appointment with me.   Review of systems:     No chest pain, no SOB, no fevers, no urinary sx   Past Medical History:  Diagnosis Date   Contraceptive management 07/13/2015   Contraceptive management 07/13/2015    Contraceptive management 07/13/2015   Menarche at 17 Regular and moderate flow No history of abnormal pap in past G2P2, s/p 2 SVD No history of abnormal MGM No concerns today No gyn surgeries LMP 12/15/2015, normal, 5 days    GERD (gastroesophageal reflux disease)    H/O gestational diabetes mellitus, not currently pregnant    Hemorrhoid    Hyperglycemia 05/03/2017   Hyperlipidemia    Preventative health care 01/05/2016   Type 2 diabetes mellitus with complication, without long-term current use of insulin ([place]) 01/14/2022    Patient's surgical history, family medical history, social history, medications and allergies were all reviewed in Epic    Current Outpatient Medications  Medication Sig Dispense Refill   Ascorbic Acid (VITAMIN C PO) Take by mouth.     cyclobenzaprine (FLEXERIL) 10 MG tablet Take 1 tablet (10 mg total) by mouth 2 (two) times daily as needed for pain. 14 tablet 0   desogestrel-ethinyl estradiol (PIMTREA) 0.15-0.02/0.01 MG (21/5) tablet Take 1 tablet by mouth daily. 84 tablet 1   famotidine (PEPCID) 40 MG tablet Take 1 tablet (40 mg total) by mouth at bedtime. 90 tablet 0   hydrocortisone (ANUSOL-HC) 25 MG suppository Unwrap and insert 1 suppository (25 mg total) rectally 2 (two) times daily. 12 suppository 1   ibuprofen (ADVIL) 800 MG tablet Take 800 mg by mouth 2 (two) times daily as needed.     meloxicam (MOBIC) 15 MG tablet Take 1 tablet (15 mg total) by mouth daily as needed for pain. 30 tablet 0   metFORMIN (GLUCOPHAGE) 500 MG tablet Take 1 tablet (500 mg total) by mouth daily with breakfast. 90 tablet 1   metoprolol succinate (TOPROL-XL) 50 MG 24 hr tablet Take 1 tablet (50 mg total)  by mouth daily. Take with or immediately following a meal. 90 tablet 3   naproxen (NAPROSYN) 500 MG tablet Take 1 tablet (500 mg total) by mouth 2 (two) times daily with a meal. 30 tablet 0   ondansetron (ZOFRAN-ODT) 8 MG disintegrating tablet Take 1 tablet (8 mg total) by mouth every 8  (eight) hours as needed for nausea or vomiting. 20 tablet 0   OVER THE COUNTER MEDICATION Take 1 tablet by mouth every other day. Magnesium powder nightly     rosuvastatin (CRESTOR) 40 MG tablet Take 1 tablet (40 mg total) by mouth daily. 90 tablet 0   tiZANidine (ZANAFLEX) 4 MG tablet Take 1 tablet (4 mg total) by mouth at bedtime. 30 tablet 0   Zinc Acetate, Oral, (ZINC ACETATE PO) Take 1 tablet by mouth daily.     No current facility-administered medications for this visit.    Physical Exam:     BP 120/78 (BP Location: Left Arm, Patient Position: Sitting, Cuff Size: Normal)   Pulse 76   Ht '5\' 4"'$  (1.626 m)   Wt 144 lb (65.3 kg)   SpO2 99%   BMI 24.72 kg/m   GENERAL:  Pleasant female in NAD PSYCH: : Cooperative, normal affect NEURO: Alert and oriented x 3, no focal neurologic deficits Rectal exam: Sensation intact and preserved anal wink.  Grade 1 hemorrhoid noted in RA position on anoscopy.  No external anal fissures noted. Normal sphincter tone. No palpable mass. No blood on the exam glove. (Chaperone: Renee Rival, CMA).   IMPRESSION and PLAN:    #1.  Symptomatic internal hemorrhoids: PROCEDURE NOTE: The patient presents with symptomatic grade 1 hemorrhoid, unresponsive to maximal medical therapy, requesting rubber band ligation of symptomatic hemorrhoidal disease.  All risks, benefits and alternative forms of therapy were described and informed consent was obtained.  In the Left Lateral Decubitus position, anoscopic examination revealed grade 1 hemorrhoids in the RA position(s).  The anorectum was pre-medicated with RectiCare. The decision was made to band the RA internal hemorrhoid, and the [place] was used to perform band ligation without complication.  Digital anorectal examination was then performed to assure proper positioning of the band, and to adjust the banded tissue as required.  The patient was discharged home without pain or other issues.  Dietary and  behavioral recommendations were given and along with follow-up instructions.     The following adjunctive treatments were recommended:  -Resume high-fiber diet with fiber supplement (i.e. Citrucel or Benefiber) with goal for soft stools without straining to have a BM. -Resume adequate fluid intake.  The patient will return as needed for follow-up and possible additional banding as required if recurrence of hemorrhoidal symptoms. No complications were encountered and the patient tolerated the procedure well.      #2.  Chronic constipation - Symptoms highly suspicious for pelvic floor dyssynergia. - Referral for pelvic floor PT  - Resume adequate fluid intake and high-fiber diet    #3  History of colon polyps - Repeat colonoscopy in 07/2023 for ongoing polyp surveillance  I spent an additional 20 minutes of nonprocedural time, including independent review of results as outlined above, communicating results with the patient directly, face-to-face time with the patient, coordinating care, ordering studies and medications as appropriate, and documentation.         [place] ,DO, FACG 11/02/2022, 4:00 PM

## 2022-11-14 ENCOUNTER — Other Ambulatory Visit: Payer: Self-pay | Admitting: Family Medicine

## 2022-11-15 ENCOUNTER — Other Ambulatory Visit (HOSPITAL_BASED_OUTPATIENT_CLINIC_OR_DEPARTMENT_OTHER): Payer: Self-pay

## 2022-11-15 MED ORDER — FAMOTIDINE 40 MG PO TABS
40.0000 mg | ORAL_TABLET | Freq: Every day | ORAL | 0 refills | Status: DC
  Filled 2022-11-15: qty 30, 30d supply, fill #0
  Filled 2023-01-21: qty 30, 30d supply, fill #1
  Filled 2023-05-09: qty 30, 30d supply, fill #2

## 2022-11-22 ENCOUNTER — Telehealth: Payer: Self-pay | Admitting: Family Medicine

## 2022-11-22 NOTE — Telephone Encounter (Signed)
Pt is requesting to get her second shringix shot. She needs it after 2. Please help pt get scheduled.

## 2022-11-23 ENCOUNTER — Other Ambulatory Visit (HOSPITAL_BASED_OUTPATIENT_CLINIC_OR_DEPARTMENT_OTHER): Payer: Self-pay | Admitting: Family Medicine

## 2022-11-23 DIAGNOSIS — Z1231 Encounter for screening mammogram for malignant neoplasm of breast: Secondary | ICD-10-CM

## 2022-11-23 NOTE — Telephone Encounter (Signed)
Called pt Lvm regarding needing shingrix shot   For NV call our office to make appt

## 2022-11-28 ENCOUNTER — Other Ambulatory Visit: Payer: Self-pay | Admitting: Family Medicine

## 2022-11-28 DIAGNOSIS — Z3009 Encounter for other general counseling and advice on contraception: Secondary | ICD-10-CM

## 2022-11-29 ENCOUNTER — Other Ambulatory Visit (HOSPITAL_BASED_OUTPATIENT_CLINIC_OR_DEPARTMENT_OTHER): Payer: Self-pay

## 2022-11-29 ENCOUNTER — Encounter: Payer: Self-pay | Admitting: *Deleted

## 2022-11-29 MED ORDER — DESOGESTREL-ETHINYL ESTRADIOL 0.15-0.02/0.01 MG (21/5) PO TABS
1.0000 | ORAL_TABLET | Freq: Every day | ORAL | 0 refills | Status: DC
  Filled 2022-11-29: qty 84, 84d supply, fill #0

## 2022-12-03 ENCOUNTER — Ambulatory Visit (INDEPENDENT_AMBULATORY_CARE_PROVIDER_SITE_OTHER): Payer: 59

## 2022-12-03 DIAGNOSIS — Z23 Encounter for immunization: Secondary | ICD-10-CM | POA: Diagnosis not present

## 2022-12-03 NOTE — Progress Notes (Addendum)
Pt here for 2nd Shingles vaccine per Dr. Charlett Blake. Vaccine given in left deltoid. Pt handled well.

## 2022-12-23 ENCOUNTER — Other Ambulatory Visit: Payer: Self-pay | Admitting: Family Medicine

## 2022-12-24 ENCOUNTER — Other Ambulatory Visit (HOSPITAL_BASED_OUTPATIENT_CLINIC_OR_DEPARTMENT_OTHER): Payer: Self-pay

## 2022-12-24 MED ORDER — METFORMIN HCL 500 MG PO TABS
500.0000 mg | ORAL_TABLET | Freq: Every day | ORAL | 1 refills | Status: DC
  Filled 2022-12-24: qty 30, 30d supply, fill #0
  Filled 2023-03-01: qty 30, 30d supply, fill #1
  Filled 2023-05-09: qty 30, 30d supply, fill #2
  Filled 2023-07-11 (×2): qty 30, 30d supply, fill #3
  Filled 2023-08-15: qty 30, 30d supply, fill #4
  Filled 2023-09-09: qty 30, 30d supply, fill #5

## 2022-12-24 MED ORDER — ROSUVASTATIN CALCIUM 40 MG PO TABS
40.0000 mg | ORAL_TABLET | Freq: Every day | ORAL | 0 refills | Status: DC
  Filled 2022-12-24: qty 30, 30d supply, fill #0
  Filled 2023-01-21: qty 30, 30d supply, fill #1
  Filled 2023-03-01: qty 30, 30d supply, fill #2

## 2022-12-28 ENCOUNTER — Ambulatory Visit (HOSPITAL_BASED_OUTPATIENT_CLINIC_OR_DEPARTMENT_OTHER)
Admission: RE | Admit: 2022-12-28 | Discharge: 2022-12-28 | Disposition: A | Discharge status: Home / Self Care | Payer: 59 | Source: Ambulatory Visit | Attending: Family Medicine | Admitting: Family Medicine

## 2022-12-28 ENCOUNTER — Encounter (HOSPITAL_BASED_OUTPATIENT_CLINIC_OR_DEPARTMENT_OTHER): Payer: Self-pay

## 2022-12-28 DIAGNOSIS — Z1231 Encounter for screening mammogram for malignant neoplasm of breast: Secondary | ICD-10-CM | POA: Diagnosis present

## 2023-01-11 ENCOUNTER — Other Ambulatory Visit: Payer: Self-pay

## 2023-01-11 ENCOUNTER — Ambulatory Visit: Payer: 59 | Attending: Gastroenterology | Admitting: Physical Therapy

## 2023-01-11 ENCOUNTER — Encounter: Payer: Self-pay | Admitting: Physical Therapy

## 2023-01-11 DIAGNOSIS — K641 Second degree hemorrhoids: Secondary | ICD-10-CM | POA: Insufficient documentation

## 2023-01-11 DIAGNOSIS — R252 Cramp and spasm: Secondary | ICD-10-CM | POA: Insufficient documentation

## 2023-01-11 DIAGNOSIS — Z86010 Personal history of colonic polyps: Secondary | ICD-10-CM | POA: Insufficient documentation

## 2023-01-11 DIAGNOSIS — R293 Abnormal posture: Secondary | ICD-10-CM | POA: Diagnosis not present

## 2023-01-11 DIAGNOSIS — M62838 Other muscle spasm: Secondary | ICD-10-CM

## 2023-01-11 NOTE — Therapy (Addendum)
OUTPATIENT PHYSICAL THERAPY FEMALE PELVIC EVALUATION   Patient Name: Vickie Thompson MRN: [id number] DOB:May 20, 1971, 52 y.o., female Today's Date: 01/11/2023  END OF SESSION:  PT End of Session - 01/11/23 1501     Visit Number 1    Date for PT Re-Evaluation 07/14/23    Authorization Type [employer]    PT Start Time 1448    PT Stop Time 1530    PT Time Calculation (min) 42 min    Activity Tolerance Patient tolerated treatment well    Behavior During Therapy [facility] for tasks assessed/performed             Past Medical History:  Diagnosis Date   Contraceptive management 07/13/2015   Contraceptive management 07/13/2015   Contraceptive management 07/13/2015   Menarche at 17 Regular and moderate flow No history of abnormal pap in past G2P2, s/p 2 SVD No history of abnormal MGM No concerns today No gyn surgeries LMP 12/15/2015, normal, 5 days    GERD (gastroesophageal reflux disease)    H/O gestational diabetes mellitus, not currently pregnant    Hemorrhoid    Hyperglycemia 05/03/2017   Hyperlipidemia    Preventative health care 01/05/2016   Type 2 diabetes mellitus with complication, without long-term current use of insulin ([place]) 01/14/2022   Past Surgical History:  Procedure Laterality Date   NO PAST SURGERIES     Patient Active Problem List   Diagnosis Date Noted   Palpitations 01/14/2022   Type 2 diabetes mellitus with complication, without long-term current use of insulin ([facility]) 01/14/2022   Acid reflux 09/16/2019   Left wrist pain 08/19/2019   Right elbow pain 08/19/2019   Rectal bleeding 06/04/2018   Knee pain 06/04/2018   Hyperglycemia 05/03/2017   Lateral epicondylitis, left elbow 01/12/2017   Preventative health care 01/05/2016   Contraceptive management 07/13/2015   Vitamin D deficiency 02/20/2012   Hyperlipidemia    H/O gestational diabetes mellitus, not currently pregnant     PCP: Mosie Lukes, MD   REFERRING PROVIDER: Lavena Bullion, DO   REFERRING DIAG:   K64.1 (ICD-10-CM) - Grade II internal hemorrhoids  M62.89 (ICD-10-CM) - Pelvic floor dysfunction  Z86.010 (ICD-10-CM) - History of colonic polyps    THERAPY DIAG:  Other muscle spasm  Abnormal posture  Rationale for Evaluation and Treatment: Rehabilitation  ONSET DATE: years  SUBJECTIVE:                                                                                                                                                                                           SUBJECTIVE STATEMENT: Pt has had constipation and it hurts to have  bowel movement.  The hemorrhoids can also come out when stressed. Fluid intake: Yes: water - a lot    PAIN:  Are you having pain? Yes (not now but just when it comes out) NPRS scale: 4-5/10/10 Pain location: Internal and Anal  Pain type: throbbing Pain description: intermittent   Aggravating factors: every time have bowel movement and sometimes when I pee it also comes;  Relieving factors: lying down, push it in and it depends how long it is out  PRECAUTIONS: None  WEIGHT BEARING RESTRICTIONS: No  FALLS:  Has patient fallen in last 6 months? No  LIVING ENVIRONMENT: Lives with: lives with their spouse and children Lives in: House/apartment   OCCUPATION: nurse and in an office now at [employer]: Independent  PATIENT GOALS: walking, bowel movement and empty bladder and hemorrhoid not come out  PERTINENT HISTORY:  Chronic constipation and multiple hemorrhoid banding surgeries Sexual abuse: No  BOWEL MOVEMENT: Pain with bowel movement: Yes Type of bowel movement:Type (Bristol Stool Scale) soft with mirilax or hard without mirilax, Frequency 1-2/day, and Strain Yes Fully empty rectum: No Leakage: No Pads: No Fiber supplement: No  URINATION: Pain with urination: No Fully empty bladder: Yes:   Stream: Strong Urgency: Yes:   Frequency: 30 min  Leakage: Exercise Pads: Yes: on liner  INTERCOURSE: Pain with intercourse:  no  pain PREGNANCY: Vaginal deliveries 2 Tearing Yes:     PROLAPSE:    OBJECTIVE:   DIAGNOSTIC FINDINGS:    PATIENT SURVEYS:    PFIQ-7   COGNITION: Overall cognitive status: Within functional limits for tasks assessed     SENSATION: Light touch:  Proprioception:   MUSCLE LENGTH: Hamstrings:90% Thomas test:   LUMBAR SPECIAL TESTS:  Straight leg raise test: Negative  FUNCTIONAL TESTS: ASLR easier with compression - core weakness   GAIT:  Comments: WFL   POSTURE: rounded shoulders, increased lumbar lordosis, and anterior pelvic tilt  PELVIC ALIGNMENT:  LUMBARAROM/PROM:  A/PROM A/PROM  eval  Flexion 75%  Extension   Right lateral flexion   Left lateral flexion   Right rotation   Left rotation    (Blank rows = not tested)  LOWER EXTREMITY ROM:  Passive ROM Right eval Left eval  Hip flexion 80% WFL   Hip extension    Hip abduction    Hip adduction    Hip internal rotation 75% WFL   Hip external rotation 75% 75%  Knee flexion    Knee extension    Ankle dorsiflexion    Ankle plantarflexion    Ankle inversion    Ankle eversion     (Blank rows = not tested)  LOWER EXTREMITY MMT:  MMT Right eval Left eval  Hip flexion    Hip extension    Hip abduction    Hip adduction    Hip internal rotation    Hip external rotation    Knee flexion    Knee extension    Ankle dorsiflexion    Ankle plantarflexion    Ankle inversion    Ankle eversion     PALPATION:   General  gluteals and lumbar paraspinals tight, chest breathing                External Perineal Exam  deferred - just palpating throughout clothes to cue bulging pelvic floor                             Internal Pelvic Floor NA  Patient confirms identification and approves PT to assess internal pelvic floor and treatment deferred  PELVIC MMT:  NA  MMT eval  Vaginal   Internal Anal Sphincter   External Anal Sphincter   Puborectalis   Diastasis Recti   (Blank rows = not  tested)        TONE: NA  PROLAPSE: NA  TODAY'S TREATMENT:                                                                                                                              DATE: 01/11/23  EVAL dry needling info, toileting techniques, and exercises with deep breathing happy baby, wall stretch, breathing Manual: lumbar and piriformis Trigger Point Dry-Needling  Treatment instructions: Expect mild to moderate muscle soreness. S/S of pneumothorax if dry needled over a lung field, and to seek immediate medical attention should they occur. Patient verbalized understanding of these instructions and education.  Patient Consent Given: Yes Education handout provided: Yes Muscles treated: lumbar piriformis Electrical stimulation performed: No Parameters: N/A Treatment response/outcome: increased soft tissue length and twitch    PATIENT EDUCATION:  Education details: Access Code: [email or web address] and toileting techniques, abdominal massage Person educated: Patient Education method: Explanation, Demonstration, Tactile cues, Verbal cues, and Handouts Education comprehension: verbalized understanding and returned demonstration  HOME EXERCISE PROGRAM: Access Code: [email or web address] on medbridge  ASSESSMENT:  CLINICAL IMPRESSION: Patient is a 52 y.o. female who was seen today for physical therapy evaluation and treatment for constipation and recurrent hemorrhoids. Pt was assessed with minimal time as she needed to have treatment included due to not knowing when she is able to get back in.  Pt does have decreased bulge of pelvic floor and tight low back and gluteal muscles.  Pt has some core weakness as noted with ASLR test.  Pt will benefit from skilled PT to address these impairments for improved pelvic floor length and possibly benefit from internal assessment and treatment of pelvic floor muscles as she is able to return to PT.  Pt was also given lifestyle tips today in order to address reduced  straining with toileting.  OBJECTIVE IMPAIRMENTS: decreased coordination, decreased ROM, decreased strength, increased muscle spasms, impaired flexibility, impaired tone, postural dysfunction, and pain.   ACTIVITY LIMITATIONS: continence and toileting  PARTICIPATION LIMITATIONS: community activity, occupation, and exercise  PERSONAL FACTORS: 3+ comorbidities: 2 vaginal deliveries, chronic hemorrhoid, constipation  are also affecting patient's functional outcome.   REHAB POTENTIAL: Excellent  CLINICAL DECISION MAKING: Evolving/moderate complexity  EVALUATION COMPLEXITY: Moderate   GOALS: Goals reviewed with patient? Yes  SHORT TERM GOALS: Target date: 02/22/23  Given initial HEP and toileting techniques Baseline: Goal status: MET   LONG TERM GOALS: Target date: 07/14/23  Pt will be independent with advanced HEP to maintain improvements made throughout therapy  Baseline:  Goal status: INITIAL  2.  Pt will report 80% reduction of pain due to improvements in posture, strength, and muscle length  Baseline:  Goal status:  INITIAL  3.  Pt will be able to lift at least 10 lb correctly for 10 reps without pain or hemorrhoid flare due to improved techniques  Baseline:  Goal status: INITIAL  4.  Pt will report her BMs are complete without straining due to improved bowel habits and evacuation techniques.  Baseline:  Goal status: INITIAL  5.  Pt will not have hemorrhoid coming out with walking or urinating due to improved strength and coordination Baseline:  Goal status: INITIAL   PLAN:  PT FREQUENCY: 1x/week reduced to monthly as needed  PT DURATION: 6 months  PLANNED INTERVENTIONS: Therapeutic exercises, Therapeutic activity, Neuromuscular re-education, Balance training, Gait training, Patient/Family education, Self Care, Joint mobilization, Dry Needling, Electrical stimulation, Cryotherapy, Moist heat, scar mobilization, Taping, Traction, Biofeedback, Manual therapy, and  Re-evaluation  PLAN FOR NEXT SESSION: f/u on initial handouts and dry needling; lumbar/piriformis dry needling #2, stretches and bulging and internal assessment rectally if tolerates or vaginally   Junious Silk, PT 01/11/2023, 5:16 PM  PHYSICAL THERAPY DISCHARGE SUMMARY  Visits from Start of Care: 1  Current functional level related to goals / functional outcomes: See above   Remaining deficits: See above only one time visit   Education / Equipment: 1 time visit as seen above   Patient agrees to discharge. Patient goals were not met. Patient is being discharged due to  pt unable to come to this clinic because of time/travel.  Russella Dar, PT, DPT 04/25/23 9:58 AM

## 2023-01-21 ENCOUNTER — Other Ambulatory Visit (HOSPITAL_BASED_OUTPATIENT_CLINIC_OR_DEPARTMENT_OTHER): Payer: Self-pay

## 2023-02-16 ENCOUNTER — Telehealth: Payer: Self-pay | Admitting: Family Medicine

## 2023-02-16 ENCOUNTER — Other Ambulatory Visit: Payer: Self-pay | Admitting: Family Medicine

## 2023-02-16 DIAGNOSIS — Z3009 Encounter for other general counseling and advice on contraception: Secondary | ICD-10-CM

## 2023-02-16 NOTE — Telephone Encounter (Signed)
Pt called stating that she is heading to the beach at the end of [place] and was wondering if there was something she could take to control her menstruation. She is currently on an oral contraceptive.

## 2023-02-17 ENCOUNTER — Other Ambulatory Visit (HOSPITAL_BASED_OUTPATIENT_CLINIC_OR_DEPARTMENT_OTHER): Payer: Self-pay

## 2023-02-17 MED ORDER — DESOGESTREL-ETHINYL ESTRADIOL 0.15-0.02/0.01 MG (21/5) PO TABS
1.0000 | ORAL_TABLET | Freq: Every day | ORAL | 0 refills | Status: DC
  Filled 2023-02-17: qty 84, 84d supply, fill #0

## 2023-02-17 NOTE — Telephone Encounter (Signed)
Called pt lvm per Dr.Blyth skip the placebo pills and start a  new pack immediately and that will keep her from bleeding. To call our office if have any question.

## 2023-02-28 ENCOUNTER — Telehealth: Payer: Self-pay | Admitting: Gastroenterology

## 2023-02-28 ENCOUNTER — Other Ambulatory Visit: Payer: Self-pay

## 2023-02-28 ENCOUNTER — Other Ambulatory Visit (HOSPITAL_BASED_OUTPATIENT_CLINIC_OR_DEPARTMENT_OTHER): Payer: Self-pay

## 2023-02-28 DIAGNOSIS — K649 Unspecified hemorrhoids: Secondary | ICD-10-CM

## 2023-02-28 MED ORDER — HYDROCORTISONE ACETATE 25 MG RE SUPP
25.0000 mg | Freq: Two times a day (BID) | RECTAL | 1 refills | Status: DC
  Filled 2023-02-28: qty 12, 6d supply, fill #0
  Filled 2023-07-14: qty 12, 6d supply, fill #1

## 2023-02-28 NOTE — Telephone Encounter (Signed)
PT is calling to get a refill on her hydrocortisone suppositories. She is leaving the country for 3 or more weeks and would need extra. It needs to be sent to the [facility] pharmacy in [place]. Please advise.

## 2023-02-28 NOTE — Telephone Encounter (Signed)
LVM stating that medication has been sent to patient's pharmacy.

## 2023-03-10 ENCOUNTER — Other Ambulatory Visit (HOSPITAL_BASED_OUTPATIENT_CLINIC_OR_DEPARTMENT_OTHER): Payer: Self-pay

## 2023-03-10 ENCOUNTER — Ambulatory Visit: Payer: 59 | Admitting: Family Medicine

## 2023-03-10 ENCOUNTER — Encounter: Payer: Self-pay | Admitting: Family Medicine

## 2023-03-10 ENCOUNTER — Ambulatory Visit (INDEPENDENT_AMBULATORY_CARE_PROVIDER_SITE_OTHER): Payer: 59 | Admitting: Family Medicine

## 2023-03-10 VITALS — BP 130/69 | HR 84 | Ht 64.0 in | Wt 136.0 lb

## 2023-03-10 DIAGNOSIS — L750 Bromhidrosis: Secondary | ICD-10-CM

## 2023-03-10 DIAGNOSIS — R21 Rash and other nonspecific skin eruption: Secondary | ICD-10-CM | POA: Diagnosis not present

## 2023-03-10 MED ORDER — CLOTRIMAZOLE-BETAMETHASONE 1-0.05 % EX CREA
1.0000 | TOPICAL_CREAM | Freq: Every day | CUTANEOUS | 0 refills | Status: DC
  Filled 2023-03-10: qty 30, 30d supply, fill #0

## 2023-03-10 MED ORDER — DRYSOL 20 % EX SOLN
Freq: Every day | CUTANEOUS | 0 refills | Status: DC
  Filled 2023-03-10: qty 35, 30d supply, fill #0

## 2023-03-10 NOTE — Patient Instructions (Signed)
You can try some Lotrisone cream to the rash on your chest for 1 week. If not improving or new symtpoms develop, let us know.    I will send in a prescription solution to use on affected underarm. Please also review the following supportive measures. If not improving, please let us know so we can try other options.  Hygiene adjustments -- Our typical approach to hygiene advice includes a discussion of bathing frequency, antibacterial cleansers, and clothing.  ?Daily washing/bathing - Affected areas should be washed daily. However, excessive washing should be discouraged to avoid skin irritation.  ?Use of antibacterial washes/soaps - Products that reduce bacterial colonization in the affected area can be expected to help with odor. Antibacterial washes and bar soaps are widely available.  ?Clothing selection and practices - Wearing of absorbent clothing, particularly cotton or materials that wick moisture away from the skin, may be helpful, particularly in the case of socks for foot odor. Perspiration-soaked clothing should be promptly removed, and clothing should be changed at least daily.  ?Other measures - Hair may contribute to bromhidrosis through trapping bacteria and odor. Removal of hair in the affected areas through means such as shaving, chemical epilation, or electrolysis may reduce odor, especially in apocrine bromhidrosis. Laser hair removal may be helpful; however, bromhidrosis has occurred as an adverse effect of laser hair removal [11].

## 2023-03-10 NOTE — Progress Notes (Signed)
Acute Office Visit  Subjective:     Patient ID: Vickie Thompson, female    DOB: 11-03-1970, 52 y.o.   MRN: [id number]  Chief Complaint  Patient presents with   Skin Problem    HPI Patient is in today for malodorous right axillary region and rash to chest/abdomen.    Right underarm has been odorous for the past 5 months or so. States she has noticed swelling a little more lately as well, primarily right axilla. She has tried Hibiclens which does help, but only temporarily. She uses Dove deodorant. She denies any pain, erythema, inflammation, lesions, drainage, or warmth.   Several weeks ago she also noticed a small red patch to her left lower chest, under breast. States it has slowly gotten a little bigger, but has not noticed any associated symptoms. She is wondering if it is some type of fungal infection. She denies any breast changes, itching pain, lesions, inflammation, induration, drainage. She had a normal mammogram in March.    ROS All review of systems negative except what is listed in the HPI      Objective:    BP 130/69   Pulse 84   Ht 5\' 4"  (1.626 m)   Wt 136 lb (61.7 kg)   SpO2 99%   BMI 23.34 kg/m    Physical Exam Vitals reviewed.  Constitutional:      Appearance: Normal appearance.  Musculoskeletal:        General: No swelling or tenderness. Normal range of motion.  Skin:    General: Skin is warm and dry.     Comments: Right axillary region: no skin changes or edema, no lymphadenopathy, no odor noted on exam  Left lower chest, below breast: small patch of erythematous skin, no inflammation, induration, papules/pustules, drainage, streaking   Neurological:     Mental Status: She is alert.  Psychiatric:        Mood and Affect: Mood normal.        Behavior: Behavior normal.        Thought Content: Thought content normal.        Judgment: Judgment normal.        No results found for any visits on 03/10/23.      Assessment & Plan:   Problem  List Items Addressed This Visit   None Visit Diagnoses     Rash    -  Primary   Relevant Medications   clotrimazole-betamethasone (LOTRISONE) cream   Bromhidrosis       Relevant Medications   aluminum chloride (DRYSOL) 20 % external solution     You can try some Lotrisone cream to the rash on your chest for 1 week. If not improving or new symtpoms develop, let us know.    I will send in a prescription solution to use on affected underarm. Please also review the following supportive measures. If not improving, please let us know so we can try other options.  Hygiene adjustments -- Our typical approach to hygiene advice includes a discussion of bathing frequency, antibacterial cleansers, and clothing.  ?Daily washing/bathing - Affected areas should be washed daily. However, excessive washing should be discouraged to avoid skin irritation.  ?Use of antibacterial washes/soaps - Products that reduce bacterial colonization in the affected area can be expected to help with odor. Antibacterial washes and bar soaps are widely available.  ?Clothing selection and practices - Wearing of absorbent clothing, particularly cotton or materials that wick moisture away from the skin, may be  helpful, particularly in the case of socks for foot odor. Perspiration-soaked clothing should be promptly removed, and clothing should be changed at least daily.  ?Other measures - Hair may contribute to bromhidrosis through trapping bacteria and odor. Removal of hair in the affected areas through means such as shaving, chemical epilation, or electrolysis may reduce odor, especially in apocrine bromhidrosis. Laser hair removal may be helpful; however, bromhidrosis has occurred as an adverse effect of laser hair removal  - Up to Date.    Please contact office for follow-up if symptoms do not improve or worsen. Seek emergency care if symptoms become severe.    Meds ordered this encounter  Medications    clotrimazole-betamethasone (LOTRISONE) cream    Sig: Apply 1 Application topically daily.    Dispense:  30 g    Refill:  0    Order Specific Question:   Supervising Provider    Answer:   Danise Edge A [[id number]]   aluminum chloride (DRYSOL) 20 % external solution    Sig: Apply topically at bedtime. Apply nightly for 2-3 days then use to 1-2 times per week.    Dispense:  35 mL    Refill:  0    Order Specific Question:   Supervising Provider    Answer:   Danise Edge A [[id number]]    Return if symptoms worsen or fail to improve.  Clayborne Dana, NP

## 2023-04-05 ENCOUNTER — Ambulatory Visit: Payer: 59 | Admitting: Physical Therapy

## 2023-04-11 ENCOUNTER — Other Ambulatory Visit (HOSPITAL_BASED_OUTPATIENT_CLINIC_OR_DEPARTMENT_OTHER): Payer: Self-pay

## 2023-04-11 ENCOUNTER — Other Ambulatory Visit: Payer: Self-pay | Admitting: Cardiology

## 2023-04-11 ENCOUNTER — Other Ambulatory Visit: Payer: Self-pay | Admitting: Internal Medicine

## 2023-04-11 MED ORDER — METOPROLOL SUCCINATE ER 50 MG PO TB24
50.0000 mg | ORAL_TABLET | Freq: Every day | ORAL | 3 refills | Status: DC
  Filled 2023-04-11: qty 30, 30d supply, fill #0
  Filled 2023-05-15: qty 30, 30d supply, fill #1
  Filled 2023-06-09: qty 30, 30d supply, fill #2
  Filled 2023-07-14: qty 30, 30d supply, fill #3
  Filled 2023-08-15: qty 30, 30d supply, fill #4
  Filled 2023-09-09: qty 30, 30d supply, fill #5
  Filled 2023-10-10: qty 30, 30d supply, fill #6
  Filled 2023-11-17: qty 30, 30d supply, fill #7
  Filled 2023-12-12: qty 30, 30d supply, fill #8
  Filled 2024-01-17: qty 30, 30d supply, fill #9
  Filled 2024-02-20: qty 30, 30d supply, fill #10
  Filled 2024-03-14: qty 30, 30d supply, fill #11

## 2023-04-11 MED ORDER — ROSUVASTATIN CALCIUM 40 MG PO TABS
40.0000 mg | ORAL_TABLET | Freq: Every day | ORAL | 0 refills | Status: DC
  Filled 2023-04-11: qty 30, 30d supply, fill #0
  Filled 2023-05-15: qty 30, 30d supply, fill #1
  Filled 2023-06-09: qty 30, 30d supply, fill #2

## 2023-04-12 ENCOUNTER — Other Ambulatory Visit (HOSPITAL_BASED_OUTPATIENT_CLINIC_OR_DEPARTMENT_OTHER): Payer: Self-pay

## 2023-05-03 ENCOUNTER — Encounter: Payer: 59 | Admitting: Physical Therapy

## 2023-05-10 ENCOUNTER — Other Ambulatory Visit (HOSPITAL_BASED_OUTPATIENT_CLINIC_OR_DEPARTMENT_OTHER): Payer: Self-pay

## 2023-05-10 ENCOUNTER — Other Ambulatory Visit: Payer: Self-pay | Admitting: Family Medicine

## 2023-05-10 DIAGNOSIS — Z3009 Encounter for other general counseling and advice on contraception: Secondary | ICD-10-CM

## 2023-05-10 MED ORDER — DESOGESTREL-ETHINYL ESTRADIOL 0.15-0.02/0.01 MG (21/5) PO TABS
1.0000 | ORAL_TABLET | Freq: Every day | ORAL | 0 refills | Status: DC
  Filled 2023-05-10: qty 84, 84d supply, fill #0

## 2023-05-12 ENCOUNTER — Encounter: Payer: 59 | Admitting: Family Medicine

## 2023-05-25 ENCOUNTER — Encounter (INDEPENDENT_AMBULATORY_CARE_PROVIDER_SITE_OTHER): Payer: Self-pay

## 2023-06-10 ENCOUNTER — Other Ambulatory Visit (HOSPITAL_BASED_OUTPATIENT_CLINIC_OR_DEPARTMENT_OTHER): Payer: Self-pay

## 2023-06-10 ENCOUNTER — Ambulatory Visit (INDEPENDENT_AMBULATORY_CARE_PROVIDER_SITE_OTHER): Payer: 59 | Admitting: Family

## 2023-06-10 ENCOUNTER — Encounter: Payer: Self-pay | Admitting: Family

## 2023-06-10 ENCOUNTER — Other Ambulatory Visit (HOSPITAL_COMMUNITY)
Admission: RE | Admit: 2023-06-10 | Discharge: 2023-06-10 | Disposition: A | Discharge status: Home / Self Care | Payer: 59 | Source: Ambulatory Visit | Attending: Family Medicine | Admitting: Family Medicine

## 2023-06-10 ENCOUNTER — Telehealth: Payer: Self-pay | Admitting: Family

## 2023-06-10 VITALS — BP 108/64 | HR 79 | Temp 98.0°F | Resp 16 | Ht 60.5 in | Wt 140.0 lb

## 2023-06-10 DIAGNOSIS — Z309 Encounter for contraceptive management, unspecified: Secondary | ICD-10-CM

## 2023-06-10 DIAGNOSIS — Z114 Encounter for screening for human immunodeficiency virus [HIV]: Secondary | ICD-10-CM

## 2023-06-10 DIAGNOSIS — Z0000 Encounter for general adult medical examination without abnormal findings: Secondary | ICD-10-CM

## 2023-06-10 DIAGNOSIS — Z1159 Encounter for screening for other viral diseases: Secondary | ICD-10-CM

## 2023-06-10 DIAGNOSIS — L74510 Primary focal hyperhidrosis, axilla: Secondary | ICD-10-CM

## 2023-06-10 DIAGNOSIS — H6123 Impacted cerumen, bilateral: Secondary | ICD-10-CM

## 2023-06-10 DIAGNOSIS — Z1211 Encounter for screening for malignant neoplasm of colon: Secondary | ICD-10-CM | POA: Diagnosis not present

## 2023-06-10 DIAGNOSIS — Z23 Encounter for immunization: Secondary | ICD-10-CM | POA: Diagnosis not present

## 2023-06-10 DIAGNOSIS — E118 Type 2 diabetes mellitus with unspecified complications: Secondary | ICD-10-CM | POA: Diagnosis not present

## 2023-06-10 DIAGNOSIS — Z01419 Encounter for gynecological examination (general) (routine) without abnormal findings: Secondary | ICD-10-CM | POA: Insufficient documentation

## 2023-06-10 DIAGNOSIS — E782 Mixed hyperlipidemia: Secondary | ICD-10-CM

## 2023-06-10 DIAGNOSIS — R059 Cough, unspecified: Secondary | ICD-10-CM | POA: Insufficient documentation

## 2023-06-10 DIAGNOSIS — E1169 Type 2 diabetes mellitus with other specified complication: Secondary | ICD-10-CM

## 2023-06-10 HISTORY — DX: Primary focal hyperhidrosis, axilla: L74.510

## 2023-06-10 HISTORY — DX: Cough, unspecified: R05.9

## 2023-06-10 HISTORY — DX: Impacted cerumen, bilateral: H61.23

## 2023-06-10 MED ORDER — BENZONATATE 100 MG PO CAPS
100.0000 mg | ORAL_CAPSULE | Freq: Two times a day (BID) | ORAL | 0 refills | Status: DC | PRN
  Filled 2023-06-10: qty 20, 10d supply, fill #0

## 2023-06-10 MED ORDER — ROSUVASTATIN CALCIUM 40 MG PO TABS
40.0000 mg | ORAL_TABLET | Freq: Every day | ORAL | 0 refills | Status: DC
  Filled 2023-06-10: qty 90, 90d supply, fill #0
  Filled 2023-07-14: qty 30, 30d supply, fill #0
  Filled 2023-08-15: qty 30, 30d supply, fill #1
  Filled 2023-09-09: qty 30, 30d supply, fill #2

## 2023-06-10 NOTE — Assessment & Plan Note (Signed)
On crestor, update lipid panel.

## 2023-06-10 NOTE — Patient Instructions (Signed)
VISIT SUMMARY:  During your recent visit, we discussed your ongoing issues with hypertension, hyperlipidemia, and diabetes, as well as a persistent cough and a skin concern following laser hair removal. We also discussed your current medications and your desire to continue using birth control.  YOUR PLAN:  -CHRONIC COUGH: You've had a cough for three weeks, but tested negative for COVID-19. I've prescribed Tessalon Perles to help with the cough. If it continues, please let me know.  -HYPERHIDROSIS: You've noticed an unusual odor and occasional itching after laser hair removal. I've examined the area and didn't find any signs of infection. Please continue your regular hygiene practices and monitor for any changes.  -HYPERLIPIDEMIA: Hyperlipidemia is a condition where there are high levels of fats in your blood. You're currently taking rosuvastatin for this. I've ordered a lipid panel to check your cholesterol levels.  -TYPE 2 DIABETES MELLITUS: You have type 2 diabetes and are taking metformin. I've ordered an A1c test to check your blood sugar levels over the past few months.  -HYPERTENSION: Hypertension is high blood pressure. Yours is well controlled with Toprol XL 50mg  daily.  -CONTRACEPTION: You're currently on oral contraceptive pills and wish to continue. I'll consult with Dr. Vedia Coffer about continuing these pills at your age.  INSTRUCTIONS:  For your general health, I've administered a Tetanus vaccine and performed a Pap smear. I've also ordered a one-time HIV and Hepatitis C screening, and referred you for a colonoscopy to be scheduled after the new year. I'll obtain your recent eye exam report from the [facility] in [facility]. I recommend getting a Flu shot and COVID booster in the fall, and using over-the-counter ear wax kits for ear wax removal.

## 2023-06-10 NOTE — Assessment & Plan Note (Addendum)
Lab Results  Component Value Date   HGBA1C 6.5 10/29/2021   HGBA1C 6.2 06/05/2021   HGBA1C 6.2 03/13/2020   Lab Results  Component Value Date   LDLCALC 73 08/10/2017   CREATININE 0.74 10/29/2021   Last A1C was at goal but has not been drawn since 2023. Labs as order. Obtain follow up A1C. Will request copy of DM eye exam.

## 2023-06-10 NOTE — Telephone Encounter (Signed)
Electronic request sent 

## 2023-06-10 NOTE — Assessment & Plan Note (Signed)
Pt is interested in discontinuing her OCP based on her age. Will defer to PCP.

## 2023-06-10 NOTE — Assessment & Plan Note (Signed)
Recommended otc wax removal kit and discontinuation of Q-Tips.

## 2023-06-10 NOTE — Progress Notes (Signed)
Subjective:     Patient ID: Vickie Thompson, female    DOB: 1971-07-24, 52 y.o.   MRN: [id number]  Chief Complaint  Patient presents with   Annual Exam         HPI  Discussed the use of AI scribe software for clinical note transcription with the patient, who gave verbal consent to proceed.  History of Present Illness   The patient, with a history of hypertension, hyperlipidemia, and diabetes, presents for a routine physical exam. She reports an intermittent cough for the past three weeks. States she tested negative for COVID-19 at home. She denies any associated symptoms suggestive of allergies. She also mentions a skin concern related to laser hair removal, describing an unusual odor and occasional itching in the right axilla. She denies any signs of skin infection such as redness but notes "a smell on the skin" despite use of drysol.  She is currently on metformin for diabetes, Toprol XL 50 for hypertension, and rosuvastatin for hyperlipidemia. She also takes  Pepcid regularly for her gastrointestinal symptoms, which she reports are well controlled. She is sexually active and on birth control, expressing a desire to discontinue  due to her age.  She denies any concerns about depression or anxiety.   Health Maintenance Due  Topic Date Due   FOOT EXAM  Never done   OPHTHALMOLOGY EXAM  Never done   HIV Screening  Never done   Diabetic kidney evaluation - Urine ACR  Never done   Hepatitis C Screening  Never done   HEMOGLOBIN A1C  04/28/2022   Diabetic kidney evaluation - eGFR measurement  10/29/2022   INFLUENZA VACCINE  05/26/2023   Colonoscopy  07/29/2023    Past Medical History:  Diagnosis Date   Contraceptive management 07/13/2015   Contraceptive management 07/13/2015   Contraceptive management 07/13/2015   Menarche at 17 Regular and moderate flow No history of abnormal pap in past G2P2, s/p 2 SVD No history of abnormal MGM No concerns today No gyn surgeries LMP 12/15/2015, normal,  5 days    GERD (gastroesophageal reflux disease)    H/O gestational diabetes mellitus, not currently pregnant    Hemorrhoid    Hyperglycemia 05/03/2017   Hyperlipidemia    Preventative health care 01/05/2016   Type 2 diabetes mellitus with complication, without long-term current use of insulin (HCC) 01/14/2022    Past Surgical History:  Procedure Laterality Date   NO PAST SURGERIES      Family History  Problem Relation Age of Onset   Mental illness Mother        depression   Diabetes Father    Hypertension Father    Hypertension Sister    Gout Brother    Cancer Maternal Aunt        lung   Colon cancer Neg Hx    Esophageal cancer Neg Hx    Rectal cancer Neg Hx    Stomach cancer Neg Hx     Social History   Socioeconomic History   Marital status: Married    Spouse name: Not on file   Number of children: 2   Years of education: Not on file   Highest education level: Not on file  Occupational History   Not on file  Tobacco Use   Smoking status: Never   Smokeless tobacco: Never  Vaping Use   Vaping status: Never Used  Substance and Sexual Activity   Alcohol use: Yes    Comment: occasionally   Drug use:  No   Sexual activity: Yes    Partners: Male    Birth control/protection: Pill  Other Topics Concern   Not on file  Social History Narrative   Not on file   Social Determinants of Health   Financial Resource Strain: Not on file  Food Insecurity: Not on file  Transportation Needs: Not on file  Physical Activity: Not on file  Stress: Not on file  Social Connections: Not on file  Intimate Partner Violence: Not on file    Outpatient Medications Prior to Visit  Medication Sig Dispense Refill   Ascorbic Acid (VITAMIN C PO) Take by mouth.     desogestrel-ethinyl estradiol (PIMTREA) 0.15-0.02/0.01 MG (21/5) tablet Take 1 tablet by mouth daily. 84 tablet 0   famotidine (PEPCID) 40 MG tablet Take 1 tablet (40 mg total) by mouth at bedtime. 90 tablet 0    hydrocortisone (ANUSOL-HC) 25 MG suppository Unwrap and insert 1 suppository (25 mg total) rectally 2 (two) times daily. 12 suppository 1   ibuprofen (ADVIL) 800 MG tablet Take 800 mg by mouth 2 (two) times daily as needed.     metFORMIN (GLUCOPHAGE) 500 MG tablet Take 1 tablet (500 mg total) by mouth daily with breakfast. 90 tablet 1   metoprolol succinate (TOPROL-XL) 50 MG 24 hr tablet Take 1 tablet (50 mg total) by mouth daily. Take with or immediately following a meal. 90 tablet 3   ondansetron (ZOFRAN-ODT) 8 MG disintegrating tablet Take 1 tablet (8 mg total) by mouth every 8 (eight) hours as needed for nausea or vomiting. 20 tablet 0   OVER THE COUNTER MEDICATION Take 1 tablet by mouth every other day. Magnesium powder nightly     Zinc Acetate, Oral, (ZINC ACETATE PO) Take 1 tablet by mouth daily.     rosuvastatin (CRESTOR) 40 MG tablet Take 1 tablet (40 mg total) by mouth daily. 90 tablet 0   aluminum chloride (DRYSOL) 20 % external solution Apply topically at bedtime. Apply nightly for 2-3 days then use to 1-2 times per week. 35 mL 0   clotrimazole-betamethasone (LOTRISONE) cream Apply 1 Application topically daily. 30 g 0   cyclobenzaprine (FLEXERIL) 10 MG tablet Take 1 tablet (10 mg total) by mouth 2 (two) times daily as needed for pain. 14 tablet 0   naproxen (NAPROSYN) 500 MG tablet Take 1 tablet (500 mg total) by mouth 2 (two) times daily with a meal. 30 tablet 0   tiZANidine (ZANAFLEX) 4 MG tablet Take 1 tablet (4 mg total) by mouth at bedtime. 30 tablet 0   No facility-administered medications prior to visit.    Allergies  Allergen Reactions   Retinoids Shortness Of Breath   Sudafed [Pseudoephedrine Hcl] Palpitations    Review of Systems  Constitutional:  Negative for weight loss.  HENT:  Positive for congestion (x 3 weeks.). Negative for hearing loss.   Eyes:  Negative for blurred vision.  Respiratory:  Positive for cough.   Cardiovascular:  Negative for leg swelling.   Gastrointestinal:  Negative for constipation and diarrhea.  Genitourinary:  Negative for dysuria and frequency.  Musculoskeletal:  Negative for joint pain and myalgias.  Skin:  Negative for rash.  Neurological:  Negative for headaches.  Psychiatric/Behavioral:         Denies depression/anxiety       Objective:    Physical Exam   BP 108/64 (BP Location: Right Arm, Patient Position: Sitting, Cuff Size: Small)   Pulse 79   Temp 98 F (36.7 C) (Oral)  Resp 16   Ht 5' 0.5" (1.537 m)   Wt 140 lb (63.5 kg)   SpO2 98%   BMI 26.89 kg/m  Wt Readings from Last 3 Encounters:  06/10/23 140 lb (63.5 kg)  03/10/23 136 lb (61.7 kg)  11/02/22 144 lb (65.3 kg)   Physical Exam  Constitutional: She is oriented to person, place, and time. She appears well-developed and well-nourished. No distress.  HENT:  Head: Normocephalic and atraumatic.  Right Ear: Tympanic membrane and ear canal normal.  Left Ear: Tympanic membrane and ear canal normal.  Mouth/Throat: Oropharynx is clear and moist.  Eyes: Pupils are equal, round, and reactive to light. No scleral icterus.  Neck: Normal range of motion. No thyromegaly present.  Cardiovascular: Normal rate and regular rhythm.   No murmur heard. Pulmonary/Chest: Effort normal and breath sounds normal. No respiratory distress. He has no wheezes. She has no rales. She exhibits no tenderness.  Abdominal: Soft. Bowel sounds are normal. She exhibits no distension and no mass. There is no tenderness. There is no rebound and no guarding.  Musculoskeletal: She exhibits no edema.  Lymphadenopathy:    She has no cervical adenopathy.  Neurological: She is alert and oriented to person, place, and time. She has normal patellar reflexes. She exhibits normal muscle tone. Coordination normal.  Skin: Skin is warm and dry.  Psychiatric: She has a normal mood and affect. Her behavior is normal. Judgment and thought content normal.  Breasts: Examined lying (normal  axilla bilaterally) Right: Without masses, retractions, discharge or axillary adenopathy.  Left: Without masses, retractions, discharge or axillary adenopathy.  Inguinal/mons: Normal without inguinal adenopathy  External genitalia: Normal  BUS/Urethra/Skene's glands: Normal  Bladder: Normal  Vagina: Normal  Cervix: Normal  Uterus: normal in size, shape and contour. Midline and mobile  Adnexa/parametria:  Rt: Without masses or tenderness.  Lt: Without masses or tenderness.  Anus and perineum: Normal            Assessment & Plan:       Assessment & Plan:   Problem List Items Addressed This Visit       Unprioritized   Type 2 diabetes mellitus with complication, without long-term current use of insulin (HCC)    Lab Results  Component Value Date   HGBA1C 6.5 10/29/2021   HGBA1C 6.2 06/05/2021   HGBA1C 6.2 03/13/2020   Lab Results  Component Value Date   LDLCALC 73 08/10/2017   CREATININE 0.74 10/29/2021   Last A1C was at goal but has not been drawn since 2023. Labs as order. Obtain follow up A1C. Will request copy of DM eye exam.       Relevant Medications   rosuvastatin (CRESTOR) 40 MG tablet   Preventative health care - Primary    Continue work on healthy diet and regular exercise. Mammo up to date. Pap performed today. Continue healthy diet and regular exercise. Due for colo after 07/29/23. Referral placed. Tdap updated today. Recommended covid/flu boosters in the fall.       Hyperlipidemia    On crestor, update lipid panel.       Relevant Medications   rosuvastatin (CRESTOR) 40 MG tablet   Other Relevant Orders   Lipid panel   Hyperhidrosis of axilla    New.  No odor appreciated today.  Recommended trial of otc antibiotic ointment to right Axilla nightly to see if this helps with any subacute skin infection.       Cough    Present x 3 weeks.  Likely due to resolving URI. Requesting rx for cough- rx sent for tessalon. She is advised to let me know if her  symptoms don't improve over the next few weeks.      Contraceptive management    Pt is interested in discontinuing her OCP based on her age. Will defer to PCP.      Ceruminosis, bilateral    Recommended otc wax removal kit and discontinuation of Q-Tips.       Other Visit Diagnoses     Screening for colon cancer       Relevant Orders   Ambulatory referral to Gastroenterology   Encounter for screening for HIV       Relevant Orders   HIV antibody (with reflex)   Encounter for hepatitis C screening test for low risk patient       Relevant Orders   Hepatitis C Antibody   Type 2 diabetes mellitus with hyperlipidemia (HCC)       Relevant Medications   rosuvastatin (CRESTOR) 40 MG tablet   Other Relevant Orders   Urine Microalbumin w/creat. ratio   HgB A1c   Comp Met (CMET)   Need for Tdap vaccination       Relevant Orders   Tdap vaccine greater than or equal to 7yo IM (Completed)   Encounter for routine gynecological examination with Papanicolaou smear of cervix       Relevant Orders   Cytology - PAP( [place])       I have discontinued Kyilee Coplin. Whiten's naproxen, tiZANidine, cyclobenzaprine, clotrimazole-betamethasone, and Drysol. I am also having her start on benzonatate. Additionally, I am having her maintain her Ascorbic Acid (VITAMIN C PO), (Zinc Acetate, Oral, (ZINC ACETATE PO)), OVER THE COUNTER MEDICATION, ibuprofen, ondansetron, famotidine, metFORMIN, hydrocortisone, metoprolol succinate, desogestrel-ethinyl estradiol, and rosuvastatin.  Meds ordered this encounter  Medications   benzonatate (TESSALON) 100 MG capsule    Sig: Take 1 capsule (100 mg total) by mouth 2 (two) times daily as needed for cough.    Dispense:  20 capsule    Refill:  0    Order Specific Question:   Supervising Provider    Answer:   Danise Edge A [[id number]]   rosuvastatin (CRESTOR) 40 MG tablet    Sig: Take 1 tablet (40 mg total) by mouth daily.    Dispense:  90 tablet    Refill:  0     Order Specific Question:   Supervising Provider    Answer:   Danise Edge A [[id number]]

## 2023-06-10 NOTE — Assessment & Plan Note (Addendum)
Continue work on [employer] and regular exercise. Mammo up to date. Pap performed today. Continue healthy diet and regular exercise. Due for colo after 07/29/23. Referral placed. Tdap updated today. Recommended covid/flu boosters in the fall.

## 2023-06-10 NOTE — Assessment & Plan Note (Signed)
Present x 3 weeks.  Likely due to resolving URI. Requesting rx for cough- rx sent for tessalon. She is advised to let me know if her symptoms don't improve over the next few weeks.

## 2023-06-10 NOTE — Telephone Encounter (Signed)
Please request copy of DM eye from [facility] point, [facility] dr.

## 2023-06-10 NOTE — Assessment & Plan Note (Signed)
New.  No odor appreciated today.  Recommended trial of otc antibiotic ointment to right Axilla nightly to see if this helps with any subacute skin infection.

## 2023-06-11 LAB — MICROALBUMIN / CREATININE URINE RATIO
Creatinine, Urine: 92 mg/dL (ref 20–275)
Microalb Creat Ratio: 2 mg/g{creat} (ref <30)
Microalb, Ur: 0.2 mg/dL

## 2023-06-11 LAB — COMPREHENSIVE METABOLIC PANEL
AG Ratio: 1.5 (calc) (ref 1.0–2.5)
ALT: 18 U/L (ref 6–29)
AST: 20 U/L (ref 10–35)
Albumin: 4.3 g/dL (ref 3.6–5.1)
Alkaline phosphatase (APISO): 63 U/L (ref 37–153)
BUN/Creatinine Ratio: 8 (calc) (ref 6–22)
BUN: 9 mg/dL (ref 7–25)
CO2: 25 mmol/L (ref 20–32)
Calcium: 9.5 mg/dL (ref 8.6–10.4)
Chloride: 106 mmol/L (ref 98–110)
Creat: 1.07 mg/dL — ABNORMAL HIGH (ref 0.50–1.03)
Globulin: 2.9 g/dL (ref 1.9–3.7)
Glucose, Bld: 139 mg/dL — ABNORMAL HIGH (ref 65–99)
Potassium: 3.6 mmol/L (ref 3.5–5.3)
Sodium: 140 mmol/L (ref 135–146)
Total Bilirubin: 0.5 mg/dL (ref 0.2–1.2)
Total Protein: 7.2 g/dL (ref 6.1–8.1)

## 2023-06-11 LAB — HEMOGLOBIN A1C
Hgb A1c MFr Bld: 6.4 %{Hb} — ABNORMAL HIGH (ref <5.7)
Mean Plasma Glucose: 137 mg/dL
eAG (mmol/L): 7.6 mmol/L

## 2023-06-11 LAB — LIPID PANEL
Cholesterol: 165 mg/dL (ref <200)
HDL: 64 mg/dL (ref >=50)
LDL Cholesterol (Calc): 67 mg/dL
Non-HDL Cholesterol (Calc): 101 mg/dL (ref <130)
Total CHOL/HDL Ratio: 2.6 (calc) (ref <5.0)
Triglycerides: 247 mg/dL — ABNORMAL HIGH (ref <150)

## 2023-06-11 LAB — HEPATITIS C ANTIBODY: Hepatitis C Ab: NONREACTIVE

## 2023-06-11 LAB — HIV ANTIBODY (ROUTINE TESTING W REFLEX): HIV 1&2 Ab, 4th Generation: NONREACTIVE

## 2023-06-14 ENCOUNTER — Encounter: Payer: 59 | Admitting: Physical Therapy

## 2023-06-14 LAB — CYTOLOGY - PAP
Comment: NEGATIVE
Diagnosis: NEGATIVE
High risk HPV: NEGATIVE

## 2023-06-15 ENCOUNTER — Other Ambulatory Visit: Payer: Self-pay | Admitting: Family Medicine

## 2023-06-15 DIAGNOSIS — Z3009 Encounter for other general counseling and advice on contraception: Secondary | ICD-10-CM

## 2023-06-16 ENCOUNTER — Other Ambulatory Visit (HOSPITAL_BASED_OUTPATIENT_CLINIC_OR_DEPARTMENT_OTHER): Payer: Self-pay

## 2023-06-16 MED ORDER — DESOGESTREL-ETHINYL ESTRADIOL 0.15-0.02/0.01 MG (21/5) PO TABS
1.0000 | ORAL_TABLET | Freq: Every day | ORAL | 0 refills | Status: DC
  Filled 2023-06-16 – 2023-07-19 (×4): qty 84, 84d supply, fill #0

## 2023-07-11 ENCOUNTER — Other Ambulatory Visit (HOSPITAL_BASED_OUTPATIENT_CLINIC_OR_DEPARTMENT_OTHER): Payer: Self-pay

## 2023-07-12 ENCOUNTER — Other Ambulatory Visit: Payer: Self-pay

## 2023-07-14 ENCOUNTER — Other Ambulatory Visit (HOSPITAL_BASED_OUTPATIENT_CLINIC_OR_DEPARTMENT_OTHER): Payer: Self-pay

## 2023-07-14 ENCOUNTER — Other Ambulatory Visit: Payer: Self-pay

## 2023-07-15 ENCOUNTER — Other Ambulatory Visit (HOSPITAL_BASED_OUTPATIENT_CLINIC_OR_DEPARTMENT_OTHER): Payer: Self-pay

## 2023-07-18 ENCOUNTER — Other Ambulatory Visit (HOSPITAL_BASED_OUTPATIENT_CLINIC_OR_DEPARTMENT_OTHER): Payer: Self-pay

## 2023-07-19 ENCOUNTER — Other Ambulatory Visit (HOSPITAL_BASED_OUTPATIENT_CLINIC_OR_DEPARTMENT_OTHER): Payer: Self-pay

## 2023-08-09 ENCOUNTER — Other Ambulatory Visit: Payer: Self-pay | Admitting: Gastroenterology

## 2023-08-09 ENCOUNTER — Other Ambulatory Visit (HOSPITAL_BASED_OUTPATIENT_CLINIC_OR_DEPARTMENT_OTHER): Payer: Self-pay

## 2023-08-09 DIAGNOSIS — K649 Unspecified hemorrhoids: Secondary | ICD-10-CM

## 2023-08-09 MED ORDER — HYDROCORTISONE ACETATE 25 MG RE SUPP
25.0000 mg | Freq: Two times a day (BID) | RECTAL | 1 refills | Status: DC
  Filled 2023-08-09: qty 11, 6d supply, fill #0
  Filled 2023-09-01: qty 11, 6d supply, fill #1
  Filled 2023-09-08: qty 2, 1d supply, fill #2

## 2023-08-12 ENCOUNTER — Other Ambulatory Visit: Payer: Self-pay

## 2023-08-12 DIAGNOSIS — K219 Gastro-esophageal reflux disease without esophagitis: Secondary | ICD-10-CM | POA: Insufficient documentation

## 2023-08-12 DIAGNOSIS — K649 Unspecified hemorrhoids: Secondary | ICD-10-CM | POA: Insufficient documentation

## 2023-08-15 ENCOUNTER — Ambulatory Visit: Payer: 59 | Attending: Cardiology | Admitting: Cardiology

## 2023-08-15 ENCOUNTER — Encounter: Payer: Self-pay | Admitting: Cardiology

## 2023-08-15 VITALS — BP 100/62 | HR 66 | Ht 60.6 in | Wt 142.0 lb

## 2023-08-15 DIAGNOSIS — R002 Palpitations: Secondary | ICD-10-CM | POA: Diagnosis not present

## 2023-08-15 DIAGNOSIS — E782 Mixed hyperlipidemia: Secondary | ICD-10-CM

## 2023-08-15 DIAGNOSIS — E118 Type 2 diabetes mellitus with unspecified complications: Secondary | ICD-10-CM | POA: Diagnosis not present

## 2023-08-15 NOTE — Progress Notes (Signed)
Cardiology Office Note:    Date:  08/15/2023   ID:  Vickie Thompson, DOB 07-21-1971, MRN [id number]  PCP:  Bradd Canary, MD  Cardiologist:  Garwin Brothers, MD   Referring MD: Bradd Canary, MD    ASSESSMENT:    1. Palpitations   2. Type 2 diabetes mellitus with complication, without long-term current use of insulin (HCC)   3. Mixed hyperlipidemia    PLAN:    In order of problems listed above:  Primary prevention stressed with the patient.  Importance of compliance with diet medication stressed and patient verbalized standing. She was advised to walk at least half an hour a day 5 days a week and she promises to do better. Mixed dyslipidemia: On lipid-lowering medications followed by primary care.  Lipids are at goal.  Triglycerides are elevated I told her to be careful and diet was emphasized.  Including low-carb diet. Diabetes mellitus: Managed by primary care.  Diet emphasized.  She is going to see a primary care for better guidance about getting her A1c lower.  Diet exercise stressed.  Patient agrees to do better with lifestyle modification. Patient will be seen in follow-up appointment in 6 months or earlier if the patient has any concerns.    Medication Adjustments/Labs and Tests Ordered: Current medicines are reviewed at length with the patient today.  Concerns regarding medicines are outlined above.  Orders Placed This Encounter  Procedures   EKG 12-Lead   No orders of the defined types were placed in this encounter.    No chief complaint on file.    History of Present Illness:    Vickie Thompson is a 52 y.o. female.  Patient has past medical history of mixed dyslipidemia and diabetes mellitus.  She denies any problems at this time and takes care of activities of daily living.  No chest pain orthopnea or PND.  At the time of my evaluation, the patient is alert awake oriented and in no distress.  She has been living a sedentary lifestyle in the past several  months.  Her hemoglobin A1c is also elevated.  Past Medical History:  Diagnosis Date   Acid reflux 09/16/2019   Ceruminosis, bilateral 06/10/2023   Contraceptive management 07/13/2015   Contraceptive management 07/13/2015   Contraceptive management 07/13/2015   Menarche at 17 Regular and moderate flow No history of abnormal pap in past G2P2, s/p 2 SVD No history of abnormal MGM No concerns today No gyn surgeries LMP 12/15/2015, normal, 5 days    Cough 06/10/2023   GERD (gastroesophageal reflux disease)    H/O gestational diabetes mellitus, not currently pregnant    Hemorrhoid    Hyperglycemia 05/03/2017   Hyperhidrosis of axilla 06/10/2023   Hyperlipidemia    Knee pain 06/04/2018   Lateral epicondylitis, left elbow 01/12/2017   Left wrist pain 08/19/2019   Palpitations 01/14/2022   Preventative health care 01/05/2016   Rectal bleeding 06/04/2018   Right elbow pain 08/19/2019   Type 2 diabetes mellitus with complication, without long-term current use of insulin (HCC) 01/14/2022   Vitamin D deficiency 02/20/2012    Past Surgical History:  Procedure Laterality Date   NO PAST SURGERIES      Current Medications: Current Meds  Medication Sig   Ascorbic Acid (VITAMIN C PO) Take 1 capsule by mouth daily.   benzonatate (TESSALON) 100 MG capsule Take 1 capsule (100 mg total) by mouth 2 (two) times daily as needed for cough.   desogestrel-ethinyl estradiol (PIMTREA)  0.15-0.02/0.01 MG (21/5) tablet Take 1 tablet by mouth daily.   famotidine (PEPCID) 40 MG tablet Take 1 tablet (40 mg total) by mouth at bedtime.   hydrocortisone (ANUSOL-HC) 25 MG suppository Unwrap and insert 1 suppository (25 mg total) rectally 2 (two) times daily.   ibuprofen (ADVIL) 800 MG tablet Take 800 mg by mouth 2 (two) times daily as needed for headache or mild pain (pain score 1-3).   metFORMIN (GLUCOPHAGE) 500 MG tablet Take 1 tablet (500 mg total) by mouth daily with breakfast.   metoprolol succinate  (TOPROL-XL) 50 MG 24 hr tablet Take 1 tablet (50 mg total) by mouth daily. Take with or immediately following a meal.   ondansetron (ZOFRAN-ODT) 8 MG disintegrating tablet Take 1 tablet (8 mg total) by mouth every 8 (eight) hours as needed for nausea or vomiting.   OVER THE COUNTER MEDICATION Take 1 tablet by mouth every other day. Magnesium powder nightly   rosuvastatin (CRESTOR) 40 MG tablet Take 1 tablet (40 mg total) by mouth daily.   Zinc Acetate, Oral, (ZINC ACETATE PO) Take 1 tablet by mouth daily.     Allergies:   Retinoids and Sudafed [pseudoephedrine hcl]   Social History   Socioeconomic History   Marital status: Married    Spouse name: Not on file   Number of children: 2   Years of education: Not on file   Highest education level: Not on file  Occupational History   Not on file  Tobacco Use   Smoking status: Never   Smokeless tobacco: Never  Vaping Use   Vaping status: Never Used  Substance and Sexual Activity   Alcohol use: Yes    Comment: occasionally   Drug use: No   Sexual activity: Yes    Partners: Male    Birth control/protection: Pill  Other Topics Concern   Not on file  Social History Narrative   Not on file   Social Determinants of Health   Financial Resource Strain: Not on file  Food Insecurity: Not on file  Transportation Needs: Not on file  Physical Activity: Not on file  Stress: Not on file  Social Connections: Not on file     Family History: The patient's family history includes Cancer in her maternal aunt; Diabetes in her father; Gout in her brother; Hypertension in her father and sister; Mental illness in her mother. There is no history of Colon cancer, Esophageal cancer, Rectal cancer, or Stomach cancer.  ROS:   Please see the history of present illness.    All other systems reviewed and are negative.  EKGs/Labs/Other Studies Reviewed:    The following studies were reviewed today: .Marland KitchenEKG Interpretation Date/Time:  Monday August 15 2023 14:27:34 EDT Ventricular Rate:  66 PR Interval:  130 QRS Duration:  84 QT Interval:  414 QTC Calculation: 434 R Axis:   77  Text Interpretation: Normal sinus rhythm Normal ECG When compared with ECG of 04-Sep-2019 18:26, No significant change was found Confirmed by Belva Crome [phone]) on 08/15/2023 2:57:29 PM     Recent Labs: 06/10/2023: ALT 18; BUN 9; Creat 1.07; Potassium 3.6; Sodium 140  Recent Lipid Panel    Component Value Date/Time   CHOL 165 06/10/2023 1503   TRIG 247 (H) 06/10/2023 1503   HDL 64 06/10/2023 1503   CHOLHDL 2.6 06/10/2023 1503   VLDL 40.6 (H) 10/29/2021 0914   LDLCALC 67 06/10/2023 1503   LDLDIRECT 99.0 10/29/2021 0914    Physical Exam:    VS:  BP 100/62   Pulse 66   Ht 5' 0.6" (1.539 m)   Wt 142 lb 0.6 oz (64.4 kg)   SpO2 96%   BMI 27.19 kg/m     Wt Readings from Last 3 Encounters:  08/15/23 142 lb 0.6 oz (64.4 kg)  06/10/23 140 lb (63.5 kg)  03/10/23 136 lb (61.7 kg)     GEN: Patient is in no acute distress HEENT: Normal NECK: No JVD; No carotid bruits LYMPHATICS: No lymphadenopathy CARDIAC: Hear sounds regular, 2/6 systolic murmur at the apex. RESPIRATORY:  Clear to auscultation without rales, wheezing or rhonchi  ABDOMEN: Soft, non-tender, non-distended MUSCULOSKELETAL:  No edema; No deformity  SKIN: Warm and dry NEUROLOGIC:  Alert and oriented x 3 PSYCHIATRIC:  Normal affect   Signed, Garwin Brothers, MD  08/15/2023 2:59 PM    [facility]

## 2023-08-15 NOTE — Patient Instructions (Signed)

## 2023-09-01 ENCOUNTER — Other Ambulatory Visit (HOSPITAL_BASED_OUTPATIENT_CLINIC_OR_DEPARTMENT_OTHER): Payer: Self-pay

## 2023-09-01 ENCOUNTER — Other Ambulatory Visit: Payer: Self-pay | Admitting: Family

## 2023-09-01 ENCOUNTER — Other Ambulatory Visit: Payer: Self-pay

## 2023-09-07 ENCOUNTER — Telehealth: Payer: Self-pay | Admitting: Gastroenterology

## 2023-09-07 NOTE — Telephone Encounter (Signed)
Inbound call from patient stating she is experiencing severe pain due to hemorrhoids. Patient is requesting a call to discuss options for pain before 11/19 office visit. Please advise, thank you

## 2023-09-08 ENCOUNTER — Other Ambulatory Visit (HOSPITAL_BASED_OUTPATIENT_CLINIC_OR_DEPARTMENT_OTHER): Payer: Self-pay

## 2023-09-08 ENCOUNTER — Other Ambulatory Visit: Payer: Self-pay | Admitting: Gastroenterology

## 2023-09-08 ENCOUNTER — Telehealth: Payer: Self-pay | Admitting: Family Medicine

## 2023-09-08 ENCOUNTER — Other Ambulatory Visit: Payer: Self-pay | Admitting: Family Medicine

## 2023-09-08 DIAGNOSIS — K649 Unspecified hemorrhoids: Secondary | ICD-10-CM

## 2023-09-08 MED ORDER — HYDROCORTISONE ACETATE 25 MG RE SUPP
25.0000 mg | Freq: Two times a day (BID) | RECTAL | 1 refills | Status: AC
  Filled 2023-09-08: qty 12, 6d supply, fill #0

## 2023-09-08 MED ORDER — HYDROCORTISONE (PERIANAL) 2.5 % EX CREA
1.0000 | TOPICAL_CREAM | Freq: Two times a day (BID) | CUTANEOUS | 1 refills | Status: AC
  Filled 2023-09-08: qty 30, 10d supply, fill #0

## 2023-09-08 NOTE — Telephone Encounter (Signed)
Left message for patient to call back  

## 2023-09-08 NOTE — Telephone Encounter (Signed)
Patient advised to follow with plans as discussed earlier today; go to urgent care/ER if pain worsens, fever, drainage etc. She verbalizes understanding.

## 2023-09-08 NOTE — Telephone Encounter (Signed)
Patient with hx internal hemorrhoids; banded.  Patient calls with complaints of prolapsing hemorrhoid and rectal pain over the last several days. States her pain started on Saturday and is often times sharp. It is pretty constant and ranges in intensity on a pain scale between 6 and 8/10. Pain is worse with sitting and worsens throughout the day. Patient states that she can typically push the hemorrhoid back inside but has been unable to do so this time. The hemorrhoid is somewhat hard, although patient denies any discoloration. She denies any drainage.  Has been using anusol suppositories which have been unhelpful and is also using lidocaine spray.  Patient is scheduled to see Alcide Evener on 09/13/23 (also first availability).  I suggested sitz baths/tub soaks 15-20 minutes up to four times daily for discomfort. Advised on the use of moistened toilet tissue following bowel movements and advised against vigorous wiping. Instructed to increase water intake to at least 64 oz daily. Patient states that she does do this and also tells me that she takes fiber, eats high fiber diet. No constipation. Advised the usre of Recticare when needed as well (since this may be more effective than lidocaine spray). Asked patient to keep upcoming 09/13/23 appointment and seek more emergent care if she develops continued worsening pain, drainage, fever.

## 2023-09-08 NOTE — Telephone Encounter (Signed)
Pt called to see if Dr. Abner Greenspan can call pain medication for her "grade 4 hemorrhoid." Pt said she called her GI doctor but has not heard back yet. Pt was offered the 3:40 pm opening today but patient said she cannot get off work. She requested a message be sent to provider to see if something can be called in. Please call to advise.

## 2023-09-09 ENCOUNTER — Other Ambulatory Visit (HOSPITAL_BASED_OUTPATIENT_CLINIC_OR_DEPARTMENT_OTHER): Payer: Self-pay

## 2023-09-09 ENCOUNTER — Other Ambulatory Visit: Payer: Self-pay

## 2023-09-09 ENCOUNTER — Other Ambulatory Visit: Payer: Self-pay | Admitting: Family Medicine

## 2023-09-09 DIAGNOSIS — Z3009 Encounter for other general counseling and advice on contraception: Secondary | ICD-10-CM

## 2023-09-09 MED ORDER — DESOGESTREL-ETHINYL ESTRADIOL 0.15-0.02/0.01 MG (21/5) PO TABS
1.0000 | ORAL_TABLET | Freq: Every day | ORAL | 0 refills | Status: DC
  Filled 2023-09-09 – 2023-10-11 (×5): qty 84, 84d supply, fill #0

## 2023-09-09 MED ORDER — FAMOTIDINE 40 MG PO TABS
40.0000 mg | ORAL_TABLET | Freq: Every day | ORAL | 0 refills | Status: DC
  Filled 2023-09-09: qty 30, 30d supply, fill #0
  Filled 2023-10-03: qty 30, 30d supply, fill #1
  Filled 2023-11-17: qty 30, 30d supply, fill #2

## 2023-09-12 NOTE — Telephone Encounter (Signed)
Called and followed up with patient. She picked up Protocare from the [employer]

## 2023-09-13 ENCOUNTER — Ambulatory Visit: Payer: 59 | Admitting: Nurse Practitioner

## 2023-09-13 ENCOUNTER — Encounter: Payer: Self-pay | Admitting: Nurse Practitioner

## 2023-09-13 VITALS — BP 110/60 | HR 67 | Ht 60.6 in | Wt 142.0 lb

## 2023-09-13 DIAGNOSIS — K644 Residual hemorrhoidal skin tags: Secondary | ICD-10-CM

## 2023-09-13 DIAGNOSIS — K648 Other hemorrhoids: Secondary | ICD-10-CM | POA: Diagnosis not present

## 2023-09-13 DIAGNOSIS — Z860100 Personal history of colon polyps, unspecified: Secondary | ICD-10-CM

## 2023-09-13 NOTE — Progress Notes (Unsigned)
     09/13/2023 Vickie Thompson [id number] [birth year]   Chief Complaint:  History of Present Illness:   She has hemorrhoids, popped out since Saturday. No rectal  bleeding. Itching and burning.  She takes Miralax daily.   Colonoscopy 07/28/2018:  - One 3 mm polyp in the cecum, removed with a cold snare. Resected and retrieved. - One 1 mm polyp in the rectum, removed with a cold snare. Complete resection. Polyp tissue not retrieved. - Non-bleeding internal hemorrhoids. - The examined portion of the ileum was normal. - 5 year recall colonoscopy  - SESSILE SERRATED POLYP WITHOUT DYSPLASIA (ONE). - NO EVIDENCE OF MALIGNANCY.      Current Medications, Allergies, Past Medical History, Past Surgical History, Family History and Social History were reviewed in [name] record.   Review of Systems:   Constitutional: Negative for fever, sweats, chills or weight loss.  Respiratory: Negative for shortness of breath.   Cardiovascular: Negative for chest pain, palpitations and leg swelling.  Gastrointestinal: See HPI.  Musculoskeletal: Negative for back pain or muscle aches.  Neurological: Negative for dizziness, headaches or paresthesias.    Physical Exam: BP 110/60 (BP Location: Right Arm, Patient Position: Sitting, Cuff Size: Normal)   Pulse 67   Ht 5' 0.6" (1.539 m)   Wt 142 lb (64.4 kg)   SpO2 (!) 85%   BMI 27.19 kg/m  General: i6 year old female in no acute distress. Head: Normocephalic and atraumatic. Eyes: No scleral icterus. Conjunctiva pink . Ears: Normal auditory acuity. Mouth: Dentition intact. No ulcers or lesions.  Lungs: Clear throughout to auscultation. Heart: Regular rate and rhythm, no murmur. Abdomen: Soft, nontender and nondistended. No masses or hepatomegaly. Normal bowel sounds x 4 quadrants.  Rectal: Inflamed edematous posterior anal hemorrhoid without bleeding. Internal hemorrhoids. Rovanda CMA present during exam.   Musculoskeletal: Symmetrical with no gross deformities. Extremities: No edema. Neurological: Alert oriented x 4. No focal deficits.  Psychological: Alert and cooperative. Normal mood and affect  Assessment and Recommendations:

## 2023-09-13 NOTE — Patient Instructions (Addendum)
It has been recommended to you by your physician that you have a(n) Colonoscopy completed. Per your request, we did not schedule the procedure(s) today. Please contact our office at [phone] should you decide to have the procedure completed. You will be scheduled for a pre-visit and procedure at that time.  We have sent over your referral to [facility]. You may receive a call within the next few weeks regarding an appointment.   Miralax- every night as tolerated  Benefiber- take 1 tablespoon daily (over the counter)  Apply a small amount of Desitin inside the anal opening and to the external anal area three times daily as needed for anal or hemorrhoidal irritation/bleeding.   Sitz bath- warm water for 10 minutes twice daily for 7 days  Due to recent changes in healthcare laws, you may see the results of your imaging and laboratory studies on MyChart before your provider has had a chance to review them.  We understand that in some cases there may be results that are confusing or concerning to you. Not all laboratory results come back in the same time frame and the provider may be waiting for multiple results in order to interpret others.  Please give [place] 48 hours in order for your provider to thoroughly review all the results before contacting the office for clarification of your results.   Thank you for trusting me with your gastrointestinal care!   Alcide Evener, CRNP

## 2023-09-15 ENCOUNTER — Encounter: Payer: Self-pay | Admitting: Family Medicine

## 2023-09-15 NOTE — Telephone Encounter (Signed)
 Care team updated and letter sent for eye exam notes.

## 2023-09-27 NOTE — Progress Notes (Signed)
Agree with the assessment and plan as outlined by Colleen Kennedy-Smith, NP.   Trask Vosler, DO, FACG [facility]   

## 2023-09-30 ENCOUNTER — Other Ambulatory Visit (HOSPITAL_BASED_OUTPATIENT_CLINIC_OR_DEPARTMENT_OTHER): Payer: Self-pay

## 2023-10-03 ENCOUNTER — Other Ambulatory Visit (HOSPITAL_BASED_OUTPATIENT_CLINIC_OR_DEPARTMENT_OTHER): Payer: Self-pay

## 2023-10-03 NOTE — Assessment & Plan Note (Signed)
Supplement and monitor 

## 2023-10-03 NOTE — Assessment & Plan Note (Signed)
hgba1c acceptable, minimize simple carbs. Increase exercise as tolerated.  

## 2023-10-03 NOTE — Assessment & Plan Note (Signed)
Encourage heart healthy diet such as MIND or DASH diet, increase exercise, avoid trans fats, simple carbohydrates and processed foods, consider a krill or fish or flaxseed oil cap daily.  °

## 2023-10-04 ENCOUNTER — Ambulatory Visit: Payer: 59 | Admitting: Family Medicine

## 2023-10-04 ENCOUNTER — Other Ambulatory Visit (HOSPITAL_BASED_OUTPATIENT_CLINIC_OR_DEPARTMENT_OTHER): Payer: Self-pay

## 2023-10-04 ENCOUNTER — Ambulatory Visit (INDEPENDENT_AMBULATORY_CARE_PROVIDER_SITE_OTHER): Payer: 59 | Admitting: Family Medicine

## 2023-10-04 VITALS — BP 116/72 | HR 93 | Temp 98.1°F | Resp 18 | Ht 61.0 in | Wt 143.6 lb

## 2023-10-04 DIAGNOSIS — R739 Hyperglycemia, unspecified: Secondary | ICD-10-CM

## 2023-10-04 DIAGNOSIS — E782 Mixed hyperlipidemia: Secondary | ICD-10-CM

## 2023-10-04 DIAGNOSIS — K2100 Gastro-esophageal reflux disease with esophagitis, without bleeding: Secondary | ICD-10-CM

## 2023-10-04 DIAGNOSIS — E559 Vitamin D deficiency, unspecified: Secondary | ICD-10-CM

## 2023-10-04 MED ORDER — OZEMPIC (0.25 OR 0.5 MG/DOSE) 2 MG/3ML ~~LOC~~ SOPN
0.2500 mg | PEN_INJECTOR | SUBCUTANEOUS | 1 refills | Status: DC
  Filled 2023-10-04: qty 3, 30d supply, fill #0
  Filled 2023-10-30: qty 3, 30d supply, fill #1

## 2023-10-04 NOTE — Patient Instructions (Signed)
 Semaglutide Injection What is this medication? SEMAGLUTIDE (SEM a GLOO tide) treats type 2 diabetes. It works by increasing insulin levels in your body, which decreases your blood sugar (glucose).   It also reduces the amount of sugar released into your blood and slows down your digestion. It may also be used to lower the risk of heart attack and stroke in people with type 2 diabetes. Changes to diet and exercise are often combined with this medication. This medicine may be used for other purposes; ask your health care provider or pharmacist if you have questions. COMMON BRAND NAME(S): OZEMPIC What should I tell my care team before I take this medication? They need to know if you have any of these conditions: Endocrine tumors (MEN 2) or if someone in your family had these tumors Eye disease, vision problems History of pancreatitis Kidney disease Stomach problems Thyroid cancer or if someone in your family had thyroid cancer An unusual or allergic reaction to semaglutide, other medications, foods, dyes, or preservatives Pregnant or trying to get pregnant Breast-feeding How should I use this medication? This medication is for injection under the skin of your upper leg (thigh), stomach area, or upper arm. It is given once every week (every 7 days). You will be taught how to prepare and give this medication. Use exactly as directed. Take your medication at regular intervals. Do not take it more often than directed. If you use this medication with insulin, you should inject this medication and the insulin separately. Do not mix them together. Do not give the injections right next to each other. Change (rotate) injection sites with each injection. It is important that you put your used needles and syringes in a special sharps container. Do not put them in a trash can. If you do not have a sharps container, call your pharmacist or care team to get one. A special MedGuide will be given to you by the  pharmacist with each prescription and refill. Be sure to read this information carefully each time. This medication comes with INSTRUCTIONS FOR USE. Ask your pharmacist for directions on how to use this medication. Read the information carefully. Talk to your pharmacist or care team if you have questions. Talk to your care team about the use of this medication in children. Special care may be needed. Overdosage: If you think you have taken too much of this medicine contact a poison control center or emergency room at once. NOTE: This medicine is only for you. Do not share this medicine with others. What if I miss a dose? If you miss a dose, take it as soon as you can within 5 days after the missed dose. Then take your next dose at your regular weekly time. If it has been longer than 5 days after the missed dose, do not take the missed dose. Take the next dose at your regular time. Do not take double or extra doses. If you have questions about a missed dose, contact your care team for advice. What may interact with this medication? Other medications for diabetes Many medications may cause changes in blood sugar, these include: Alcohol containing beverages Antiviral medications for HIV or AIDS Aspirin and aspirin-like medications Certain medications for blood pressure, heart disease, irregular heart beat Chromium Diuretics Female hormones, such as estrogens or progestins, birth control pills Fenofibrate Gemfibrozil Isoniazid Lanreotide Female hormones or anabolic steroids MAOIs like Carbex, Eldepryl, Marplan, Nardil, and Parnate Medications for weight loss Medications for allergies, asthma, cold, or cough Medications  for depression, anxiety, or psychotic disturbances Niacin Nicotine NSAIDs, medications for pain and inflammation, like ibuprofen or naproxen Octreotide Pasireotide Pentamidine Phenytoin Probenecid Quinolone antibiotics such as ciprofloxacin, levofloxacin, ofloxacin Some  herbal dietary supplements Steroid medications such as prednisone or cortisone Sulfamethoxazole; trimethoprim Thyroid hormones Some medications can hide the warning symptoms of low blood sugar (hypoglycemia). You may need to monitor your blood sugar more closely if you are taking one of these medications. These include: Beta-blockers, often used for high blood pressure or heart problems (examples include atenolol, metoprolol, propranolol) Clonidine Guanethidine Reserpine This list may not describe all possible interactions. Give your health care provider a list of all the medicines, herbs, non-prescription drugs, or dietary supplements you use. Also tell them if you smoke, drink alcohol, or use illegal drugs. Some items may interact with your medicine. What should I watch for while using this medication? Visit your care team for regular checks on your progress. Drink plenty of fluids while taking this medication. Check with your care team if you get an attack of severe diarrhea, nausea, and vomiting. The loss of too much body fluid can make it dangerous for you to take this medication. A test called the HbA1C (A1C) will be monitored. This is a simple blood test. It measures your blood sugar control over the last 2 to 3 months. You will receive this test every 3 to 6 months. Learn how to check your blood sugar. Learn the symptoms of low and high blood sugar and how to manage them. Always carry a quick-source of sugar with you in case you have symptoms of low blood sugar. Examples include hard sugar candy or glucose tablets. Make sure others know that you can choke if you eat or drink when you develop serious symptoms of low blood sugar, such as seizures or unconsciousness. They must get medical help at once. Tell your care team if you have high blood sugar. You might need to change the dose of your medication. If you are sick or exercising more than usual, you might need to change the dose of your  medication. Do not skip meals. Ask your care team if you should avoid alcohol. Many nonprescription cough and cold products contain sugar or alcohol. These can affect blood sugar. Pens should never be shared. Even if the needle is changed, sharing may result in passing of viruses like hepatitis or HIV. Wear a medical ID bracelet or chain, and carry a card that describes your disease and details of your medication and dosage times. What side effects may I notice from receiving this medication? Side effects that you should report to your care team as soon as possible: Allergic reactions--skin rash, itching, hives, swelling of the face, lips, tongue, or throat Change in vision Dehydration--increased thirst, dry mouth, feeling faint or lightheaded, headache, dark yellow or brown urine Gallbladder problems--severe stomach pain, nausea, vomiting, fever Heart palpitations--rapid, pounding, or irregular heartbeat Kidney injury--decrease in the amount of urine, swelling of the ankles, hands, or feet Pancreatitis--severe stomach pain that spreads to your back or gets worse after eating or when touched, fever, nausea, vomiting Thoughts of suicide or self-harm, worsening mood, feelings of depression Thyroid cancer--new mass or lump in the neck, pain or trouble swallowing, trouble breathing, hoarseness Side effects that usually do not require medical attention (report these to your care team if they continue or are bothersome): Diarrhea Loss of appetite Nausea Upset stomach This list may not describe all possible side effects. Call your doctor for medical  advice about side effects. You may report side effects to FDA at 1-800-FDA-1088. Where should I keep my medication? Keep out of the reach of children. Store unopened pens in a refrigerator between 2 and 8 degrees C (36 and 46 degrees F). Do not freeze. Protect from light and heat. After you first use the pen, it can be stored for 56 days at room  temperature between 15 and 30 degrees C (59 and 86 degrees F) or in a refrigerator. Throw away your used pen after 56 days or after the expiration date, whichever comes first. Do not store your pen with the needle attached. If the needle is left on, medication may leak from the pen. NOTE: This sheet is a summary. It may not cover all possible information. If you have questions about this medicine, talk to your doctor, pharmacist, or health care provider.  2024 Elsevier/Gold Standard (2023-01-17 00:00:00)

## 2023-10-05 ENCOUNTER — Other Ambulatory Visit: Payer: Self-pay | Admitting: *Deleted

## 2023-10-05 ENCOUNTER — Encounter: Payer: Self-pay | Admitting: Family Medicine

## 2023-10-05 ENCOUNTER — Other Ambulatory Visit (HOSPITAL_BASED_OUTPATIENT_CLINIC_OR_DEPARTMENT_OTHER): Payer: Self-pay

## 2023-10-05 ENCOUNTER — Telehealth: Payer: Self-pay | Admitting: Family Medicine

## 2023-10-05 DIAGNOSIS — E785 Hyperlipidemia, unspecified: Secondary | ICD-10-CM

## 2023-10-05 LAB — CBC WITH DIFFERENTIAL/PLATELET
Basophils Absolute: 0 10*3/uL (ref 0.0–0.1)
Basophils Relative: 0.5 % (ref 0.0–3.0)
Eosinophils Absolute: 0.2 10*3/uL (ref 0.0–0.7)
Eosinophils Relative: 2.7 % (ref 0.0–5.0)
HCT: 41.3 % (ref 36.0–46.0)
Hemoglobin: 13.4 g/dL (ref 12.0–15.0)
Lymphocytes Relative: 33.9 % (ref 12.0–46.0)
Lymphs Abs: 2.1 10*3/uL (ref 0.7–4.0)
MCHC: 32.4 g/dL (ref 30.0–36.0)
MCV: 93.3 fL (ref 78.0–100.0)
Monocytes Absolute: 0.6 10*3/uL (ref 0.1–1.0)
Monocytes Relative: 10 % (ref 3.0–12.0)
Neutro Abs: 3.2 10*3/uL (ref 1.4–7.7)
Neutrophils Relative %: 52.9 % (ref 43.0–77.0)
Platelets: 317 10*3/uL (ref 150.0–400.0)
RBC: 4.43 Mil/uL (ref 3.87–5.11)
RDW: 13.3 % (ref 11.5–15.5)
WBC: 6.1 10*3/uL (ref 4.0–10.5)

## 2023-10-05 LAB — COMPREHENSIVE METABOLIC PANEL
ALT: 19 U/L (ref 0–35)
AST: 23 U/L (ref 0–37)
Albumin: 4.4 g/dL (ref 3.5–5.2)
Alkaline Phosphatase: 77 U/L (ref 39–117)
BUN: 11 mg/dL (ref 6–23)
CO2: 31 meq/L (ref 19–32)
Calcium: 9.4 mg/dL (ref 8.4–10.5)
Chloride: 101 meq/L (ref 96–112)
Creatinine, Ser: 0.64 mg/dL (ref 0.40–1.20)
GFR: 102.14 mL/min (ref >60.00)
Glucose, Bld: 124 mg/dL — ABNORMAL HIGH (ref 70–99)
Potassium: 3.5 meq/L (ref 3.5–5.1)
Sodium: 140 meq/L (ref 135–145)
Total Bilirubin: 0.5 mg/dL (ref 0.2–1.2)
Total Protein: 7.3 g/dL (ref 6.0–8.3)

## 2023-10-05 LAB — LIPID PANEL
Cholesterol: 201 mg/dL — ABNORMAL HIGH (ref 0–200)
HDL: 61.4 mg/dL (ref >39.00)
LDL Cholesterol: 63 mg/dL (ref 0–99)
NonHDL: 140.06
Total CHOL/HDL Ratio: 3
Triglycerides: 383 mg/dL — ABNORMAL HIGH (ref 0.0–149.0)
VLDL: 76.6 mg/dL — ABNORMAL HIGH (ref 0.0–40.0)

## 2023-10-05 LAB — TSH: TSH: 1.54 u[IU]/mL (ref 0.35–5.50)

## 2023-10-05 LAB — HEMOGLOBIN A1C: Hgb A1c MFr Bld: 6.7 % — ABNORMAL HIGH (ref 4.6–6.5)

## 2023-10-05 LAB — VITAMIN D 25 HYDROXY (VIT D DEFICIENCY, FRACTURES): VITD: 49.41 ng/mL (ref 30.00–100.00)

## 2023-10-05 MED ORDER — ONETOUCH VERIO VI STRP
ORAL_STRIP | 1 refills | Status: AC
  Filled 2023-10-05: qty 50, 30d supply, fill #0

## 2023-10-05 MED ORDER — ONETOUCH VERIO FLEX SYSTEM W/DEVICE KIT
PACK | 0 refills | Status: AC
  Filled 2023-10-05: qty 1, 30d supply, fill #0

## 2023-10-05 MED ORDER — ONETOUCH DELICA LANCETS 33G MISC
1 refills | Status: AC
  Filled 2023-10-05: qty 100, 30d supply, fill #0

## 2023-10-05 NOTE — Telephone Encounter (Signed)
Thank you :)

## 2023-10-05 NOTE — Telephone Encounter (Signed)
Pt states she was told to provide the name and number of the eye dr she sees.   [facility] in [place] [phone]

## 2023-10-05 NOTE — Progress Notes (Signed)
Subjective:    Patient ID: Vickie Thompson, female    DOB: 04-Aug-1971, 52 y.o.   MRN: [id number]  Chief Complaint  Patient presents with  . Follow-up    3 month    HPI Discussed the use of AI scribe software for clinical note transcription with the patient, who gave verbal consent to proceed.  History of Present Illness   The patient, with a history of diabetes, hypertension, and hyperlipidemia, presents with concerns about their A1c levels. They report no recent acute illnesses or ER visits. The patient mentions an issue with their right armpit emitting an odor, which started after laser hair removal. Despite using Hibiclens and other treatments, the issue persists, causing discomfort.  The patient also reports hair loss, which they suspect might be due to their metoprolol medication. They note that their hair feels thinner and they observe more hair loss when blow-drying.  The patient has a history of plantar fasciitis and external hemorrhoids. They mention a recent visit to a surgeon for the hemorrhoids and a scheduled follow-up in January. They also report a previous treatment for plantar fasciitis, which has since improved.  The patient is currently on metformin for their diabetes and has been monitoring their blood sugar levels at work. They report a recent postprandial blood sugar level of 179 and a heart rate of 120, which they attribute to work stress.        Past Medical History:  Diagnosis Date  . Acid reflux 09/16/2019  . Ceruminosis, bilateral 06/10/2023  . Contraceptive management 07/13/2015  . Contraceptive management 07/13/2015  . Contraceptive management 07/13/2015   Menarche at 17 Regular and moderate flow No history of abnormal pap in past G2P2, s/p 2 SVD No history of abnormal MGM No concerns today No gyn surgeries LMP 12/15/2015, normal, 5 days   . Cough 06/10/2023  . GERD (gastroesophageal reflux disease)   . H/O gestational diabetes mellitus, not currently  pregnant   . Hemorrhoid   . Hyperglycemia 05/03/2017  . Hyperhidrosis of axilla 06/10/2023  . Hyperlipidemia   . Knee pain 06/04/2018  . Lateral epicondylitis, left elbow 01/12/2017  . Left wrist pain 08/19/2019  . Palpitations 01/14/2022  . Preventative health care 01/05/2016  . Rectal bleeding 06/04/2018  . Right elbow pain 08/19/2019  . Type 2 diabetes mellitus with complication, without long-term current use of insulin (HCC) 01/14/2022  . Vitamin D deficiency 02/20/2012    Past Surgical History:  Procedure Laterality Date  . NO PAST SURGERIES      Family History  Problem Relation Age of Onset  . Mental illness Mother        depression  . Diabetes Father   . Hypertension Father   . Hypertension Sister   . Gout Brother   . Cancer Maternal Aunt        lung  . Colon cancer Neg Hx   . Esophageal cancer Neg Hx   . Rectal cancer Neg Hx   . Stomach cancer Neg Hx     Social History   Socioeconomic History  . Marital status: Married    Spouse name: Not on file  . Number of children: 2  . Years of education: Not on file  . Highest education level: Not on file  Occupational History  . Not on file  Tobacco Use  . Smoking status: Never  . Smokeless tobacco: Never  Vaping Use  . Vaping status: Never Used  Substance and Sexual Activity  . Alcohol use:  Yes    Comment: occasionally  . Drug use: No  . Sexual activity: Yes    Partners: Male    Birth control/protection: Pill  Other Topics Concern  . Not on file  Social History Narrative  . Not on file   Social Determinants of Health   Financial Resource Strain: Not on file  Food Insecurity: Not on file  Transportation Needs: Not on file  Physical Activity: Not on file  Stress: Not on file  Social Connections: Not on file  Intimate Partner Violence: Not on file    Outpatient Medications Prior to Visit  Medication Sig Dispense Refill  . Ascorbic Acid (VITAMIN C PO) Take 1 capsule by mouth daily.    .  benzonatate (TESSALON) 100 MG capsule Take 1 capsule (100 mg total) by mouth 2 (two) times daily as needed for cough. 20 capsule 0  . desogestrel-ethinyl estradiol (PIMTREA) 0.15-0.02/0.01 MG (21/5) tablet Take 1 tablet by mouth daily. 84 tablet 0  . famotidine (PEPCID) 40 MG tablet Take 1 tablet (40 mg total) by mouth at bedtime. 90 tablet 0  . hydrocortisone (ANUSOL-HC) 25 MG suppository Unwrap and insert 1 suppository (25 mg total) rectally 2 (two) times daily. *Due for colonoscopy. Please call our office at [phone].* 12 suppository 1  . hydrocortisone (PROCTOCARE-HC) 2.5 % rectal cream Place 1 Application rectally 2 (two) times daily. 30 g 1  . ibuprofen (ADVIL) 800 MG tablet Take 800 mg by mouth 2 (two) times daily as needed for headache or mild pain (pain score 1-3).    . metFORMIN (GLUCOPHAGE) 500 MG tablet Take 1 tablet (500 mg total) by mouth daily with breakfast. 90 tablet 1  . metoprolol succinate (TOPROL-XL) 50 MG 24 hr tablet Take 1 tablet (50 mg total) by mouth daily. Take with or immediately following a meal. 90 tablet 3  . ondansetron (ZOFRAN-ODT) 8 MG disintegrating tablet Take 1 tablet (8 mg total) by mouth every 8 (eight) hours as needed for nausea or vomiting. 20 tablet 0  . OVER THE COUNTER MEDICATION Take 1 tablet by mouth every other day. Magnesium powder nightly    . rosuvastatin (CRESTOR) 40 MG tablet Take 1 tablet (40 mg total) by mouth daily. 90 tablet 0  . Zinc Acetate, Oral, (ZINC ACETATE PO) Take 1 tablet by mouth daily.     No facility-administered medications prior to visit.    Allergies  Allergen Reactions  . Retinoids Shortness Of Breath  . Sudafed [Pseudoephedrine Hcl] Palpitations    Review of Systems  Constitutional:  Negative for fever and malaise/fatigue.  HENT:  Negative for congestion.   Eyes:  Negative for blurred vision.  Respiratory:  Negative for cough and shortness of breath.   Cardiovascular:  Negative for chest pain, palpitations and leg  swelling.  Gastrointestinal:  Negative for abdominal pain, blood in stool and nausea.  Genitourinary:  Negative for dysuria and frequency.  Musculoskeletal:  Positive for joint pain. Negative for falls.  Skin:  Negative for rash.  Neurological:  Negative for dizziness, loss of consciousness and headaches.  Endo/Heme/Allergies:  Negative for environmental allergies.  Psychiatric/Behavioral:  Negative for depression. The patient is not nervous/anxious.        Objective:    Physical Exam Constitutional:      General: She is not in acute distress.    Appearance: Normal appearance. She is well-developed. She is not toxic-appearing.  HENT:     Head: Normocephalic and atraumatic.     Right Ear: External ear  normal.     Left Ear: External ear normal.     Nose: Nose normal.  Eyes:     General:        Right eye: No discharge.        Left eye: No discharge.     Conjunctiva/sclera: Conjunctivae normal.  Neck:     Thyroid: No thyromegaly.  Cardiovascular:     Rate and Rhythm: Normal rate and regular rhythm.     Heart sounds: Normal heart sounds. No murmur heard. Pulmonary:     Effort: Pulmonary effort is normal. No respiratory distress.     Breath sounds: Normal breath sounds.  Abdominal:     General: Bowel sounds are normal.     Palpations: Abdomen is soft.     Tenderness: There is no abdominal tenderness. There is no guarding.  Musculoskeletal:        General: Normal range of motion.     Cervical back: Neck supple.  Lymphadenopathy:     Cervical: No cervical adenopathy.  Skin:    General: Skin is warm and dry.  Neurological:     Mental Status: She is alert and oriented to person, place, and time.  Psychiatric:        Mood and Affect: Mood normal.        Behavior: Behavior normal.        Thought Content: Thought content normal.        Judgment: Judgment normal.    BP 116/72 (BP Location: Right Arm, Patient Position: Sitting, Cuff Size: Normal)   Pulse 93   Temp 98.1 F  (36.7 C) (Oral)   Resp 18   Ht 5\' 1"  (1.549 m)   Wt 143 lb 9.6 oz (65.1 kg)   SpO2 96%   BMI 27.13 kg/m  Wt Readings from Last 3 Encounters:  10/04/23 143 lb 9.6 oz (65.1 kg)  09/13/23 142 lb (64.4 kg)  08/15/23 142 lb 0.6 oz (64.4 kg)    Diabetic Foot Exam - Simple   No data filed    Lab Results  Component Value Date   WBC 6.1 10/04/2023   HGB 13.4 10/04/2023   HCT 41.3 10/04/2023   PLT 317.0 10/04/2023   GLUCOSE 124 (H) 10/04/2023   CHOL 201 (H) 10/04/2023   TRIG 383.0 (H) 10/04/2023   HDL 61.40 10/04/2023   LDLDIRECT 99.0 10/29/2021   LDLCALC 63 10/04/2023   ALT 19 10/04/2023   AST 23 10/04/2023   NA 140 10/04/2023   K 3.5 10/04/2023   CL 101 10/04/2023   CREATININE 0.64 10/04/2023   BUN 11 10/04/2023   CO2 31 10/04/2023   TSH 1.54 10/04/2023   HGBA1C 6.7 (H) 10/04/2023   MICROALBUR 0.2 06/10/2023    Lab Results  Component Value Date   TSH 1.54 10/04/2023   Lab Results  Component Value Date   WBC 6.1 10/04/2023   HGB 13.4 10/04/2023   HCT 41.3 10/04/2023   MCV 93.3 10/04/2023   PLT 317.0 10/04/2023   Lab Results  Component Value Date   NA 140 10/04/2023   K 3.5 10/04/2023   CO2 31 10/04/2023   GLUCOSE 124 (H) 10/04/2023   BUN 11 10/04/2023   CREATININE 0.64 10/04/2023   BILITOT 0.5 10/04/2023   ALKPHOS 77 10/04/2023   AST 23 10/04/2023   ALT 19 10/04/2023   PROT 7.3 10/04/2023   ALBUMIN 4.4 10/04/2023   CALCIUM 9.4 10/04/2023   GFR 102.14 10/04/2023   Lab Results  Component Value  Date   CHOL 201 (H) 10/04/2023   Lab Results  Component Value Date   HDL 61.40 10/04/2023   Lab Results  Component Value Date   LDLCALC 63 10/04/2023   Lab Results  Component Value Date   TRIG 383.0 (H) 10/04/2023   Lab Results  Component Value Date   CHOLHDL 3 10/04/2023   Lab Results  Component Value Date   HGBA1C 6.7 (H) 10/04/2023       Assessment & Plan:  Hyperglycemia -     Comprehensive metabolic panel -     Hemoglobin A1c -      TSH  Mixed hyperlipidemia -     Lipid panel -     TSH  Gastroesophageal reflux disease with esophagitis, unspecified whether hemorrhage -     CBC with Differential/Platelet  Vitamin D deficiency -     VITAMIN D 25 Hydroxy (Vit-D Deficiency, Fractures)  Other orders -     Ozempic (0.25 or 0.5 MG/DOSE); Inject 0.25 mg into the skin once a week.  Dispense: 3 mL; Refill: 1    Assessment and Plan    Axillary Odor Likely bacterial overgrowth post laser hair removal. Previous treatment with Hibiclens was ineffective. -Use Cetaphil antibacterial soap daily. -Apply witch hazel astringent or hydrogen peroxide to the area daily. -Consider weekly baths with 1-2 tablespoons of bleach. -Consider dermatology referral if no improvement.  Type 2 Diabetes Mellitus Recent A1c of 6.5 on Metformin. Discussed the benefits of Ozempic (Semaglutide) and potential side effects. -Discontinue Metformin. -Start Ozempic 0.25mg  weekly with a plan to titrate up as tolerated. -Check blood sugars at least twice weekly (fasting and postprandial). -Repeat labs in 3-4 months.  Hair Thinning Likely due to aging and hormonal changes. -Continue current regimen of fish oil or krill oil and multivitamin. -Consider adding biotin supplement.  Vitamin D Deficiency Currently taking Vitamin D supplement. -Continue current regimen. -Check Vitamin D level.  Colonoscopy Due for screening. -Plan to schedule with Dr. Rico Junker.  Foot Exam No abnormalities noted on today's exam. -Continue regular foot care.  Tachycardia Recent episode of heart rate in the 120s. No associated chest pain or shortness of breath. -If pattern continues, consider 2-week Zio monitor.  General Health Maintenance -Received flu shot. -Schedule follow-up visit in 3-4 months. -Schedule annual physical in August.         Danise Edge, MD

## 2023-10-06 ENCOUNTER — Other Ambulatory Visit (HOSPITAL_BASED_OUTPATIENT_CLINIC_OR_DEPARTMENT_OTHER): Payer: Self-pay

## 2023-10-07 ENCOUNTER — Telehealth: Payer: Self-pay | Admitting: Family Medicine

## 2023-10-07 ENCOUNTER — Other Ambulatory Visit: Payer: Self-pay | Admitting: Emergency Medicine

## 2023-10-07 DIAGNOSIS — E782 Mixed hyperlipidemia: Secondary | ICD-10-CM

## 2023-10-07 DIAGNOSIS — R739 Hyperglycemia, unspecified: Secondary | ICD-10-CM

## 2023-10-07 DIAGNOSIS — K2100 Gastro-esophageal reflux disease with esophagitis, without bleeding: Secondary | ICD-10-CM

## 2023-10-07 DIAGNOSIS — E559 Vitamin D deficiency, unspecified: Secondary | ICD-10-CM

## 2023-10-07 NOTE — Telephone Encounter (Signed)
Pt called to be scheduled for repeat labs in 3 months per her last labs comments. After reviewing chart, noted no future orders were placed and advised that a note would be sent back to get those placed before scheduling. Advised we would call her once orders were placed.

## 2023-10-07 NOTE — Telephone Encounter (Signed)
Called patient. Lab orders were put in and appointment made

## 2023-10-10 ENCOUNTER — Other Ambulatory Visit: Payer: Self-pay | Admitting: Family

## 2023-10-10 DIAGNOSIS — E782 Mixed hyperlipidemia: Secondary | ICD-10-CM

## 2023-10-11 ENCOUNTER — Other Ambulatory Visit (HOSPITAL_BASED_OUTPATIENT_CLINIC_OR_DEPARTMENT_OTHER): Payer: Self-pay

## 2023-10-11 ENCOUNTER — Other Ambulatory Visit: Payer: Self-pay

## 2023-10-11 MED ORDER — ROSUVASTATIN CALCIUM 40 MG PO TABS
40.0000 mg | ORAL_TABLET | Freq: Every day | ORAL | 0 refills | Status: DC
  Filled 2023-10-11 (×2): qty 30, 30d supply, fill #0
  Filled 2023-11-17: qty 30, 30d supply, fill #1
  Filled 2023-12-14: qty 30, 30d supply, fill #2

## 2023-10-12 ENCOUNTER — Other Ambulatory Visit: Payer: Self-pay

## 2023-11-08 ENCOUNTER — Other Ambulatory Visit: Payer: Self-pay | Admitting: Family Medicine

## 2023-11-08 ENCOUNTER — Encounter: Payer: Self-pay | Admitting: Family Medicine

## 2023-11-08 ENCOUNTER — Other Ambulatory Visit (HOSPITAL_BASED_OUTPATIENT_CLINIC_OR_DEPARTMENT_OTHER): Payer: Self-pay

## 2023-11-08 MED ORDER — OZEMPIC (0.25 OR 0.5 MG/DOSE) 2 MG/3ML ~~LOC~~ SOPN
0.5000 mg | PEN_INJECTOR | SUBCUTANEOUS | 1 refills | Status: DC
Start: 2023-11-08 — End: 2023-12-20
  Filled 2023-11-08: qty 3, fill #0
  Filled 2023-12-05: qty 3, 28d supply, fill #0

## 2023-11-11 ENCOUNTER — Other Ambulatory Visit (HOSPITAL_BASED_OUTPATIENT_CLINIC_OR_DEPARTMENT_OTHER): Payer: Self-pay

## 2023-11-11 MED ORDER — OSELTAMIVIR PHOSPHATE 75 MG PO CAPS
75.0000 mg | ORAL_CAPSULE | Freq: Two times a day (BID) | ORAL | 0 refills | Status: DC
  Filled 2023-11-11: qty 10, 5d supply, fill #0

## 2023-11-17 ENCOUNTER — Other Ambulatory Visit (HOSPITAL_BASED_OUTPATIENT_CLINIC_OR_DEPARTMENT_OTHER): Payer: Self-pay

## 2023-12-05 ENCOUNTER — Other Ambulatory Visit: Payer: Self-pay

## 2023-12-05 ENCOUNTER — Other Ambulatory Visit: Payer: Self-pay | Admitting: Family Medicine

## 2023-12-05 ENCOUNTER — Other Ambulatory Visit (HOSPITAL_BASED_OUTPATIENT_CLINIC_OR_DEPARTMENT_OTHER): Payer: Self-pay

## 2023-12-05 DIAGNOSIS — Z3009 Encounter for other general counseling and advice on contraception: Secondary | ICD-10-CM

## 2023-12-05 MED ORDER — DESOGESTREL-ETHINYL ESTRADIOL 0.15-0.02/0.01 MG (21/5) PO TABS
1.0000 | ORAL_TABLET | Freq: Every day | ORAL | 0 refills | Status: DC
  Filled 2023-12-05 – 2023-12-30 (×4): qty 84, 84d supply, fill #0

## 2023-12-08 ENCOUNTER — Other Ambulatory Visit (HOSPITAL_BASED_OUTPATIENT_CLINIC_OR_DEPARTMENT_OTHER): Payer: Self-pay

## 2023-12-09 ENCOUNTER — Ambulatory Visit
Admission: EM | Admit: 2023-12-09 | Discharge: 2023-12-09 | Disposition: A | Discharge status: Home / Self Care | Payer: 59 | Attending: Family Medicine | Admitting: Family Medicine

## 2023-12-09 ENCOUNTER — Other Ambulatory Visit: Payer: Self-pay

## 2023-12-09 ENCOUNTER — Other Ambulatory Visit (HOSPITAL_BASED_OUTPATIENT_CLINIC_OR_DEPARTMENT_OTHER): Payer: Self-pay

## 2023-12-09 DIAGNOSIS — M26622 Arthralgia of left temporomandibular joint: Secondary | ICD-10-CM

## 2023-12-09 MED ORDER — METAXALONE 400 MG PO TABS
400.0000 mg | ORAL_TABLET | Freq: Three times a day (TID) | ORAL | 9 refills | Status: DC
  Filled 2023-12-09: qty 20, 7d supply, fill #0

## 2023-12-09 NOTE — Discharge Instructions (Addendum)
Take Ibuprofen 200mg , 4 tabs every 8 hours with food.  Put ice on the painful area. To do this: Put ice in a plastic bag. Place a towel between your skin and the bag. Leave the ice on for 20 minutes, 2-3 times a day. Remove the ice if your skin turns bright red. This is very important. If you cannot feel pain, heat, or cold, you have a greater risk of damage to the area.

## 2023-12-09 NOTE — ED Provider Notes (Signed)
[facility]    CSN: [id number] Arrival date & time: 12/09/23  4098      History   Chief Complaint Chief Complaint  Patient presents with   Jaw Pain    LT sided    HPI Vickie Thompson is a 53 y.o. female.   Patient awoke yesterday with pain in her left jaw and ear.  She denies facial swelling, toothache, sore throat, and fever.  She believes that she has had TMJ pain in the past.  Ibuprofen has been some what helpful.     Past Medical History:  Diagnosis Date   Acid reflux 09/16/2019   Ceruminosis, bilateral 06/10/2023   Contraceptive management 07/13/2015   Contraceptive management 07/13/2015   Contraceptive management 07/13/2015   Menarche at 17 Regular and moderate flow No history of abnormal pap in past G2P2, s/p 2 SVD No history of abnormal MGM No concerns today No gyn surgeries LMP 12/15/2015, normal, 5 days    Cough 06/10/2023   GERD (gastroesophageal reflux disease)    H/O gestational diabetes mellitus, not currently pregnant    Hemorrhoid    Hyperglycemia 05/03/2017   Hyperhidrosis of axilla 06/10/2023   Hyperlipidemia    Knee pain 06/04/2018   Lateral epicondylitis, left elbow 01/12/2017   Left wrist pain 08/19/2019   Palpitations 01/14/2022   Preventative health care 01/05/2016   Rectal bleeding 06/04/2018   Right elbow pain 08/19/2019   Type 2 diabetes mellitus with complication, without long-term current use of insulin (HCC) 01/14/2022   Vitamin D deficiency 02/20/2012    Patient Active Problem List   Diagnosis Date Noted   GERD (gastroesophageal reflux disease)    Hemorrhoid    Ceruminosis, bilateral 06/10/2023   Cough 06/10/2023   Hyperhidrosis of axilla 06/10/2023   Palpitations 01/14/2022   Type 2 diabetes mellitus with complication, without long-term current use of insulin (HCC) 01/14/2022   Acid reflux 09/16/2019   Left wrist pain 08/19/2019   Right elbow pain 08/19/2019   Rectal bleeding 06/04/2018   Knee pain 06/04/2018    Hyperglycemia 05/03/2017   Lateral epicondylitis, left elbow 01/12/2017   Preventative health care 01/05/2016   Contraceptive management 07/13/2015   Vitamin D deficiency 02/20/2012   Hyperlipidemia    H/O gestational diabetes mellitus, not currently pregnant     Past Surgical History:  Procedure Laterality Date   NO PAST SURGERIES      OB History     Gravida  2   Para  2   Term  2   Preterm      AB      Living         SAB      IAB      Ectopic      Multiple      Live Births               Home Medications    Prior to Admission medications   Medication Sig Start Date End Date Taking? Authorizing Provider  metaxalone (SKELAXIN) 400 MG tablet Take 1 tablet (400 mg total) by mouth 3 (three) times daily. 12/09/23  Yes Lattie Haw, MD  Ascorbic Acid (VITAMIN C PO) Take 1 capsule by mouth daily.    [provider]  benzonatate (TESSALON) 100 MG capsule Take 1 capsule (100 mg total) by mouth 2 (two) times daily as needed for cough. 06/10/23   Sandford Craze, NP  Blood Glucose Monitoring Suppl (ONETOUCH VERIO FLEX SYSTEM) w/Device KIT Use to  check blood sugar once a day.  Dx code E11.8 10/05/23   Bradd Canary, MD  desogestrel-ethinyl estradiol (PIMTREA) 0.15-0.02/0.01 MG (21/5) tablet Take 1 tablet by mouth daily. 12/05/23   Bradd Canary, MD  famotidine (PEPCID) 40 MG tablet Take 1 tablet (40 mg total) by mouth at bedtime. 09/09/23   Bradd Canary, MD  glucose blood (ONETOUCH VERIO) test strip Use to check blood sugar once a day. 10/05/23   Bradd Canary, MD  hydrocortisone (ANUSOL-HC) 25 MG suppository Unwrap and insert 1 suppository (25 mg total) rectally 2 (two) times daily. *Due for colonoscopy. Please call our office at [phone].* 09/08/23   Cirigliano, Vito V, DO  hydrocortisone (PROCTOCARE-HC) 2.5 % rectal cream Place 1 Application rectally 2 (two) times daily. 09/08/23   Bradd Canary, MD  ibuprofen (ADVIL) 800 MG tablet Take  800 mg by mouth 2 (two) times daily as needed for headache or mild pain (pain score 1-3).    [provider]  metFORMIN (GLUCOPHAGE) 500 MG tablet Take 1 tablet (500 mg total) by mouth daily with breakfast. 12/24/22   Wanda Plump, MD  metoprolol succinate (TOPROL-XL) 50 MG 24 hr tablet Take 1 tablet (50 mg total) by mouth daily. Take with or immediately following a meal. 04/11/23   Georgeanna Lea, MD  ondansetron (ZOFRAN-ODT) 8 MG disintegrating tablet Take 1 tablet (8 mg total) by mouth every 8 (eight) hours as needed for nausea or vomiting. 05/06/22   Olive Bass, FNP  OneTouch Delica Lancets 33G MISC Use to check blood sugar once a day.  Dx code E11.8 10/05/23   Bradd Canary, MD  oseltamivir (TAMIFLU) 75 MG capsule Take 1 capsule (75 mg total) by mouth 2 (two) times daily. Take in the morning and in the evening for 5 days 11/11/23     OVER THE COUNTER MEDICATION Take 1 tablet by mouth every other day. Magnesium powder nightly    [provider]  rosuvastatin (CRESTOR) 40 MG tablet Take 1 tablet (40 mg total) by mouth daily. 10/11/23   Eulis Foster, FNP  Semaglutide,0.25 or 0.5MG /DOS, (OZEMPIC, 0.25 OR 0.5 MG/DOSE,) 2 MG/3ML SOPN Inject 0.5 mg into the skin once a week. 11/08/23   Bradd Canary, MD  Zinc Acetate, Oral, (ZINC ACETATE PO) Take 1 tablet by mouth daily.    [provider]    Family History Family History  Problem Relation Age of Onset   Mental illness Mother        depression   Diabetes Father    Hypertension Father    Hypertension Sister    Gout Brother    Cancer Maternal Aunt        lung   Colon cancer Neg Hx    Esophageal cancer Neg Hx    Rectal cancer Neg Hx    Stomach cancer Neg Hx     Social History Social History   Tobacco Use   Smoking status: Never   Smokeless tobacco: Never  Vaping Use   Vaping status: Never Used  Substance Use Topics   Alcohol use: Yes    Comment: occasionally   Drug use: No      Allergies   Retinoids and Sudafed [pseudoephedrine hcl]   Review of Systems Review of Systems  Constitutional:  Negative for chills, diaphoresis, fatigue and fever.  HENT:  Negative for congestion, dental problem, ear pain, facial swelling, sinus pain, sore throat and trouble swallowing.   Eyes: Negative.  Respiratory: Negative.    Cardiovascular: Negative.   Gastrointestinal: Negative.   Genitourinary: Negative.   Musculoskeletal: Negative.   Neurological: Negative.   Hematological: Negative.      Physical Exam Triage Vital Signs ED Triage Vitals [12/09/23 1016]  Encounter Vitals Group     BP 127/80     Systolic BP Percentile      Diastolic BP Percentile      Pulse Rate 81     Resp 17     Temp 97.8 F (36.6 C)     Temp Source Oral     SpO2 97 %     Weight      Height      Head Circumference      Peak Flow      Pain Score 4     Pain Loc      Pain Education      Exclude from Growth Chart    No data found.  Updated Vital Signs BP 127/80 (BP Location: Left Arm)   Pulse 81   Temp 97.8 F (36.6 C) (Oral)   Resp 17   SpO2 97%   Visual Acuity Right Eye Distance:   Left Eye Distance:   Bilateral Distance:    Right Eye Near:   Left Eye Near:    Bilateral Near:     Physical Exam Vitals and nursing note reviewed.  Constitutional:      General: She is not in acute distress.    Appearance: Normal appearance. She is not ill-appearing.  HENT:     Head: Normocephalic.     Right Ear: Tympanic membrane, ear canal and external ear normal.     Left Ear: Tympanic membrane, ear canal and external ear normal.     Ears:     Comments: There is distinct tenderness to palpation over the left TMJ.  Palpation there recreates her pain There is no mandible tenderness to palpation.    Mouth/Throat:     Mouth: Mucous membranes are moist.     Pharynx: Oropharynx is clear.  Eyes:     Pupils: Pupils are equal, round, and reactive to light.  Cardiovascular:     Rate  and Rhythm: Normal rate.  Pulmonary:     Effort: Pulmonary effort is normal.  Musculoskeletal:     Cervical back: Neck supple.  Lymphadenopathy:     Cervical: No cervical adenopathy.  Skin:    General: Skin is warm and dry.     Findings: No rash.  Neurological:     Mental Status: She is alert and oriented to person, place, and time.      UC Treatments / Results  Labs (all labs ordered are listed, but only abnormal results are displayed) Labs Reviewed - No data to display  EKG   Radiology No results found.  Procedures Procedures (including critical care time)  Medications Ordered in UC Medications - No data to display  Initial Impression / Assessment and Plan / UC Course  I have reviewed the triage vital signs and the nursing notes.  Pertinent labs & imaging results that were available during my care of the patient were reviewed by me and considered in my medical decision making (see chart for details).    Rx for Skelaxin. Followup with dentist.  Final Clinical Impressions(s) / UC Diagnoses   Final diagnoses:  Arthralgia of left temporomandibular joint     Discharge Instructions      Take Ibuprofen 200mg , 4 tabs every 8 hours with food.  Put ice on the painful area. To do this: Put ice in a plastic bag. Place a towel between your skin and the bag. Leave the ice on for 20 minutes, 2-3 times a day. Remove the ice if your skin turns bright red. This is very important. If you cannot feel pain, heat, or cold, you have a greater risk of damage to the area.    ED Prescriptions     Medication Sig Dispense Auth. Provider   metaxalone (SKELAXIN) 400 MG tablet Take 1 tablet (400 mg total) by mouth 3 (three) times daily. 20 tablet Lattie Haw, MD         Lattie Haw, MD 12/11/23 [phone]

## 2023-12-09 NOTE — ED Triage Notes (Signed)
Pt c/o LT sided jaw pain since yesterday. Possible hx of TMJ. Ibuprofen prn.

## 2023-12-12 ENCOUNTER — Other Ambulatory Visit (HOSPITAL_BASED_OUTPATIENT_CLINIC_OR_DEPARTMENT_OTHER): Payer: Self-pay

## 2023-12-12 MED ORDER — METHYLPREDNISOLONE 4 MG PO TBPK
ORAL_TABLET | ORAL | 0 refills | Status: DC
  Filled 2023-12-12: qty 21, 6d supply, fill #0

## 2023-12-15 ENCOUNTER — Other Ambulatory Visit (HOSPITAL_BASED_OUTPATIENT_CLINIC_OR_DEPARTMENT_OTHER): Payer: Self-pay

## 2023-12-20 ENCOUNTER — Other Ambulatory Visit: Payer: Self-pay | Admitting: Family Medicine

## 2023-12-20 ENCOUNTER — Encounter: Payer: Self-pay | Admitting: Family Medicine

## 2023-12-20 MED ORDER — SEMAGLUTIDE (1 MG/DOSE) 4 MG/3ML ~~LOC~~ SOPN
1.0000 mg | PEN_INJECTOR | SUBCUTANEOUS | 1 refills | Status: DC
  Filled 2023-12-20 – 2023-12-26 (×2): qty 3, 28d supply, fill #0
  Filled 2024-01-17: qty 3, 28d supply, fill #1

## 2023-12-20 NOTE — Telephone Encounter (Signed)
**Note De-identified Hilliary Jock Obfuscation** Please advise 

## 2023-12-21 ENCOUNTER — Other Ambulatory Visit (HOSPITAL_BASED_OUTPATIENT_CLINIC_OR_DEPARTMENT_OTHER): Payer: Self-pay

## 2023-12-21 ENCOUNTER — Other Ambulatory Visit: Payer: Self-pay

## 2023-12-26 ENCOUNTER — Other Ambulatory Visit: Payer: Self-pay | Admitting: Family Medicine

## 2023-12-26 ENCOUNTER — Other Ambulatory Visit: Payer: Self-pay

## 2023-12-26 ENCOUNTER — Other Ambulatory Visit (HOSPITAL_BASED_OUTPATIENT_CLINIC_OR_DEPARTMENT_OTHER): Payer: Self-pay

## 2023-12-26 MED ORDER — FAMOTIDINE 40 MG PO TABS
40.0000 mg | ORAL_TABLET | Freq: Every day | ORAL | 0 refills | Status: DC
  Filled 2023-12-26: qty 90, 90d supply, fill #0

## 2023-12-30 ENCOUNTER — Other Ambulatory Visit: Payer: Self-pay

## 2023-12-30 ENCOUNTER — Other Ambulatory Visit (HOSPITAL_BASED_OUTPATIENT_CLINIC_OR_DEPARTMENT_OTHER): Payer: Self-pay

## 2024-01-06 ENCOUNTER — Other Ambulatory Visit: Payer: 59

## 2024-01-12 ENCOUNTER — Other Ambulatory Visit: Payer: Self-pay | Admitting: Internal Medicine

## 2024-01-12 ENCOUNTER — Encounter: Payer: Self-pay | Admitting: *Deleted

## 2024-01-12 ENCOUNTER — Encounter: Payer: Self-pay | Admitting: Gastroenterology

## 2024-01-12 ENCOUNTER — Other Ambulatory Visit (HOSPITAL_BASED_OUTPATIENT_CLINIC_OR_DEPARTMENT_OTHER): Payer: Self-pay

## 2024-01-12 MED ORDER — METFORMIN HCL 500 MG PO TABS
500.0000 mg | ORAL_TABLET | Freq: Every day | ORAL | 0 refills | Status: DC
  Filled 2024-01-12: qty 30, 30d supply, fill #0

## 2024-01-13 ENCOUNTER — Other Ambulatory Visit

## 2024-01-13 DIAGNOSIS — R739 Hyperglycemia, unspecified: Secondary | ICD-10-CM

## 2024-01-13 DIAGNOSIS — E559 Vitamin D deficiency, unspecified: Secondary | ICD-10-CM

## 2024-01-13 DIAGNOSIS — K2100 Gastro-esophageal reflux disease with esophagitis, without bleeding: Secondary | ICD-10-CM

## 2024-01-13 DIAGNOSIS — E782 Mixed hyperlipidemia: Secondary | ICD-10-CM

## 2024-01-14 LAB — COMPREHENSIVE METABOLIC PANEL
AG Ratio: 1.4 (calc) (ref 1.0–2.5)
ALT: 24 U/L (ref 6–29)
AST: 25 U/L (ref 10–35)
Albumin: 4.4 g/dL (ref 3.6–5.1)
Alkaline phosphatase (APISO): 63 U/L (ref 37–153)
BUN: 10 mg/dL (ref 7–25)
CO2: 24 mmol/L (ref 20–32)
Calcium: 9.8 mg/dL (ref 8.6–10.4)
Chloride: 103 mmol/L (ref 98–110)
Creat: 0.67 mg/dL (ref 0.50–1.03)
Globulin: 3.2 g/dL (ref 1.9–3.7)
Glucose, Bld: 124 mg/dL — ABNORMAL HIGH (ref 65–99)
Potassium: 3.6 mmol/L (ref 3.5–5.3)
Sodium: 139 mmol/L (ref 135–146)
Total Bilirubin: 0.5 mg/dL (ref 0.2–1.2)
Total Protein: 7.6 g/dL (ref 6.1–8.1)
eGFR: 106 mL/min/{1.73_m2} (ref >=60)

## 2024-01-14 LAB — CBC WITH DIFFERENTIAL/PLATELET
Absolute Lymphocytes: 2135 {cells}/uL (ref 850–3900)
Absolute Monocytes: 604 {cells}/uL (ref 200–950)
Basophils Absolute: 43 {cells}/uL (ref 0–200)
Basophils Relative: 0.7 %
Eosinophils Absolute: 140 {cells}/uL (ref 15–500)
Eosinophils Relative: 2.3 %
HCT: 41.9 % (ref 35.0–45.0)
Hemoglobin: 13.7 g/dL (ref 11.7–15.5)
MCH: 29.5 pg (ref 27.0–33.0)
MCHC: 32.7 g/dL (ref 32.0–36.0)
MCV: 90.3 fL (ref 80.0–100.0)
MPV: 9.5 fL (ref 7.5–12.5)
Monocytes Relative: 9.9 %
Neutro Abs: 3178 {cells}/uL (ref 1500–7800)
Neutrophils Relative %: 52.1 %
Platelets: 303 10*3/uL (ref 140–400)
RBC: 4.64 10*6/uL (ref 3.80–5.10)
RDW: 12.7 % (ref 11.0–15.0)
Total Lymphocyte: 35 %
WBC: 6.1 10*3/uL (ref 3.8–10.8)

## 2024-01-14 LAB — TSH: TSH: 2.11 m[IU]/L

## 2024-01-14 LAB — LIPID PANEL
Cholesterol: 183 mg/dL (ref <200)
HDL: 60 mg/dL (ref >=50)
LDL Cholesterol (Calc): 89 mg/dL
Non-HDL Cholesterol (Calc): 123 mg/dL (ref <130)
Total CHOL/HDL Ratio: 3.1 (calc) (ref <5.0)
Triglycerides: 245 mg/dL — ABNORMAL HIGH (ref <150)

## 2024-01-14 LAB — VITAMIN D 25 HYDROXY (VIT D DEFICIENCY, FRACTURES): Vit D, 25-Hydroxy: 43 ng/mL (ref 30–100)

## 2024-01-14 LAB — HEMOGLOBIN A1C
Hgb A1c MFr Bld: 7 %{Hb} — ABNORMAL HIGH (ref <5.7)
Mean Plasma Glucose: 154 mg/dL
eAG (mmol/L): 8.5 mmol/L

## 2024-01-15 ENCOUNTER — Encounter: Payer: Self-pay | Admitting: Family Medicine

## 2024-01-18 ENCOUNTER — Other Ambulatory Visit: Payer: Self-pay | Admitting: Family

## 2024-01-18 DIAGNOSIS — E782 Mixed hyperlipidemia: Secondary | ICD-10-CM

## 2024-01-27 ENCOUNTER — Encounter: Payer: Self-pay | Admitting: Family

## 2024-01-27 ENCOUNTER — Other Ambulatory Visit (HOSPITAL_BASED_OUTPATIENT_CLINIC_OR_DEPARTMENT_OTHER): Payer: Self-pay

## 2024-01-27 ENCOUNTER — Telehealth: Payer: Self-pay | Admitting: Family

## 2024-01-27 ENCOUNTER — Ambulatory Visit: Admitting: Family

## 2024-01-27 VITALS — BP 116/78 | HR 104 | Temp 98.6°F | Resp 16 | Ht 60.6 in | Wt 138.8 lb

## 2024-01-27 DIAGNOSIS — K219 Gastro-esophageal reflux disease without esophagitis: Secondary | ICD-10-CM

## 2024-01-27 DIAGNOSIS — Z7984 Long term (current) use of oral hypoglycemic drugs: Secondary | ICD-10-CM

## 2024-01-27 DIAGNOSIS — Z1231 Encounter for screening mammogram for malignant neoplasm of breast: Secondary | ICD-10-CM

## 2024-01-27 DIAGNOSIS — E118 Type 2 diabetes mellitus with unspecified complications: Secondary | ICD-10-CM | POA: Diagnosis not present

## 2024-01-27 DIAGNOSIS — E782 Mixed hyperlipidemia: Secondary | ICD-10-CM

## 2024-01-27 DIAGNOSIS — I10 Essential (primary) hypertension: Secondary | ICD-10-CM | POA: Diagnosis not present

## 2024-01-27 DIAGNOSIS — R110 Nausea: Secondary | ICD-10-CM

## 2024-01-27 MED ORDER — ROSUVASTATIN CALCIUM 40 MG PO TABS
40.0000 mg | ORAL_TABLET | Freq: Every day | ORAL | 0 refills | Status: DC
  Filled 2024-01-27: qty 30, 30d supply, fill #0
  Filled 2024-02-24: qty 30, 30d supply, fill #1
  Filled 2024-03-14 – 2024-03-22 (×2): qty 30, 30d supply, fill #2

## 2024-01-27 MED ORDER — ONDANSETRON 8 MG PO TBDP
8.0000 mg | ORAL_TABLET | Freq: Three times a day (TID) | ORAL | 1 refills | Status: AC | PRN
  Filled 2024-01-27: qty 30, 10d supply, fill #0

## 2024-01-27 NOTE — Assessment & Plan Note (Signed)
Stable on pepcid

## 2024-01-27 NOTE — Patient Instructions (Signed)
 VISIT SUMMARY:  Today, we discussed your blood sugar management, cholesterol levels, and general health maintenance. We reviewed your current medications and made some adjustments to help manage your symptoms and improve your overall health.  YOUR PLAN:  -TYPE 2 DIABETES MELLITUS: Type 2 diabetes is a condition where your body does not use insulin properly, leading to high blood sugar levels. Your A1c has increased to 7.0%, so we recommend continuing with semaglutide, making lifestyle changes such as regular exercise and dietary modifications, and rechecking your A1c at the end of June.  -HYPERLIPIDEMIA: Hyperlipidemia means you have high levels of fats in your blood, which can increase your risk of heart disease. Your triglycerides are elevated, likely due to your blood sugar levels. We recommend continuing Crestor and making lifestyle changes, including regular exercise and dietary modifications.  -GASTROESOPHAGEAL REFLUX DISEASE (GERD): GERD is a condition where stomach acid frequently flows back into the tube connecting your mouth and stomach, causing discomfort. Your GERD is well-controlled with Pepcid, but you are experiencing nausea likely due to dietary reasons. We have prescribed Zofran to help manage your nausea as needed.  -GENERAL HEALTH MAINTENANCE: General health maintenance includes routine check-ups and screenings to ensure overall well-being. You are due for a mammogram, which you should schedule soon. Your colonoscopy is scheduled for Mar 16, 2024, and your annual eye exam has already been completed this year.  INSTRUCTIONS:  Please schedule a follow-up appointment in 8 weeks to update your A1c and review your health status. Make sure to schedule your mammogram and confirm your colonoscopy appointment for Mar 16, 2024.

## 2024-01-27 NOTE — Telephone Encounter (Signed)
 Please call [facility] to request copy of DM eye exam.

## 2024-01-27 NOTE — Assessment & Plan Note (Signed)
 Stable on metoprolol.

## 2024-01-27 NOTE — Progress Notes (Signed)
 Subjective:     Patient ID: Vickie Thompson, female    DOB: 17-Dec-1970, 53 y.o.   MRN: [id number]  Chief Complaint  Patient presents with   Diabetes    Here to discuss rising Hemoglobin A1c    Diabetes    Discussed the use of AI scribe software for clinical note transcription with the patient, who gave verbal consent to proceed.  History of Present Illness  Vickie Thompson is a 53 year old female with type 2 diabetes who presents for blood sugar management.  She is managing her blood sugar levels with semaglutide. Morning blood sugar readings are around 121 mg/dL, and two hours postprandial readings in the afternoon are approximately 117 mg/dL. Her hemoglobin A1c was 7% as of January 13, 2024, which had increased from previous values of 6.4% and 6.7%.  She experiences nausea, which she attributes to consuming greasy foods. No associated abdominal pain. She is taking Pepcid daily for reflux, which is well-controlled. She requests a refill of Zofran to manage her nausea.  She is on Crestor for cholesterol management. Her cholesterol levels were last checked in March, and while most values were satisfactory, her triglycerides were elevated. She engages in Pilates twice a week but finds it challenging to maintain a consistent exercise routine due to fatigue from work.  She is due for a mammogram and plans to schedule it soon. She had a colonoscopy in 2019 and is scheduled for a follow-up on Mar 16, 2024. She has an annual eye exam and has already completed it this year.     Health Maintenance Due  Topic Date Due   Pneumococcal Vaccine 50-19 Years old (1 of 2 - PCV) Never done   OPHTHALMOLOGY EXAM  Never done   Colonoscopy  07/29/2023   MAMMOGRAM  12/28/2023    Past Medical History:  Diagnosis Date   Acid reflux 09/16/2019   Ceruminosis, bilateral 06/10/2023   Contraceptive management 07/13/2015   Contraceptive management 07/13/2015   Contraceptive management 07/13/2015    Menarche at 17 Regular and moderate flow No history of abnormal pap in past G2P2, s/p 2 SVD No history of abnormal MGM No concerns today No gyn surgeries LMP 12/15/2015, normal, 5 days    Cough 06/10/2023   GERD (gastroesophageal reflux disease)    H/O gestational diabetes mellitus, not currently pregnant    Hemorrhoid    Hyperglycemia 05/03/2017   Hyperhidrosis of axilla 06/10/2023   Hyperlipidemia    Hypertension    Knee pain 06/04/2018   Lateral epicondylitis, left elbow 01/12/2017   Left wrist pain 08/19/2019   Palpitations 01/14/2022   Preventative health care 01/05/2016   Rectal bleeding 06/04/2018   Right elbow pain 08/19/2019   Type 2 diabetes mellitus with complication, without long-term current use of insulin (HCC) 01/14/2022   Vitamin D deficiency 02/20/2012    Past Surgical History:  Procedure Laterality Date   NO PAST SURGERIES      Family History  Problem Relation Age of Onset   Mental illness Mother        depression   Diabetes Father    Hypertension Father    Hypertension Sister    Gout Brother    Cancer Maternal Aunt        lung   Colon cancer Neg Hx    Esophageal cancer Neg Hx    Rectal cancer Neg Hx    Stomach cancer Neg Hx     Social History   Socioeconomic History  Marital status: Married    Spouse name: Not on file   Number of children: 2   Years of education: Not on file   Highest education level: Not on file  Occupational History   Not on file  Tobacco Use   Smoking status: Never   Smokeless tobacco: Never  Vaping Use   Vaping status: Never Used  Substance and Sexual Activity   Alcohol use: Yes    Comment: occasionally   Drug use: No   Sexual activity: Yes    Partners: Male    Birth control/protection: Pill  Other Topics Concern   Not on file  Social History Narrative   Not on file   Social Drivers of Health   Financial Resource Strain: Not on file  Food Insecurity: Not on file  Transportation Needs: Not on file   Physical Activity: Not on file  Stress: Not on file  Social Connections: Not on file  Intimate Partner Violence: Not on file    Outpatient Medications Prior to Visit  Medication Sig Dispense Refill   Ascorbic Acid (VITAMIN C PO) Take 1 capsule by mouth daily.     Blood Glucose Monitoring Suppl (ONETOUCH VERIO FLEX SYSTEM) w/Device KIT Use to check blood sugar once a day.  Dx code E11.8 1 kit 0   desogestrel-ethinyl estradiol (PIMTREA) 0.15-0.02/0.01 MG (21/5) tablet Take 1 tablet by mouth daily. 84 tablet 0   famotidine (PEPCID) 40 MG tablet Take 1 tablet (40 mg total) by mouth at bedtime. *Need appointment for future refills.* 90 tablet 0   glucose blood (ONETOUCH VERIO) test strip Use to check blood sugar once a day. 100 each 1   hydrocortisone (ANUSOL-HC) 25 MG suppository Unwrap and insert 1 suppository (25 mg total) rectally 2 (two) times daily. *Due for colonoscopy. Please call our office at [phone].* 12 suppository 1   hydrocortisone (PROCTOCARE-HC) 2.5 % rectal cream Place 1 Application rectally 2 (two) times daily. 30 g 1   ibuprofen (ADVIL) 800 MG tablet Take 800 mg by mouth 2 (two) times daily as needed for headache or mild pain (pain score 1-3).     metFORMIN (GLUCOPHAGE) 500 MG tablet Take 1 tablet (500 mg total) by mouth daily with breakfast. 90 tablet 0   metoprolol succinate (TOPROL-XL) 50 MG 24 hr tablet Take 1 tablet (50 mg total) by mouth daily. Take with or immediately following a meal. 90 tablet 3   OneTouch Delica Lancets 33G MISC Use to check blood sugar once a day.  Dx code E11.8 100 each 1   oseltamivir (TAMIFLU) 75 MG capsule Take 1 capsule (75 mg total) by mouth 2 (two) times daily. Take in the morning and in the evening for 5 days 10 capsule 0   OVER THE COUNTER MEDICATION Take 1 tablet by mouth every other day. Magnesium powder nightly     Semaglutide, 1 MG/DOSE, 4 MG/3ML SOPN Inject 1 mg as directed once a week. 3 mL 1   Zinc Acetate, Oral, (ZINC ACETATE PO)  Take 1 tablet by mouth daily.     ondansetron (ZOFRAN-ODT) 8 MG disintegrating tablet Take 1 tablet (8 mg total) by mouth every 8 (eight) hours as needed for nausea or vomiting. 20 tablet 0   rosuvastatin (CRESTOR) 40 MG tablet Take 1 tablet (40 mg total) by mouth daily. 90 tablet 0   benzonatate (TESSALON) 100 MG capsule Take 1 capsule (100 mg total) by mouth 2 (two) times daily as needed for cough. 20 capsule 0  metaxalone (SKELAXIN) 400 MG tablet Take 1 tablet (400 mg total) by mouth 3 (three) times daily. 20 tablet 9   methylPREDNISolone (MEDROL DOSEPAK) 4 MG TBPK tablet Take as directed on package. 21 tablet 0   No facility-administered medications prior to visit.    Allergies  Allergen Reactions   Retinoids Shortness Of Breath   Sudafed [Pseudoephedrine Hcl] Palpitations    ROS    See HPI Objective:    Physical Exam Constitutional:      General: She is not in acute distress.    Appearance: Normal appearance. She is well-developed.  HENT:     Head: Normocephalic and atraumatic.     Right Ear: External ear normal.     Left Ear: External ear normal.  Eyes:     General: No scleral icterus. Neck:     Thyroid: No thyromegaly.  Cardiovascular:     Rate and Rhythm: Normal rate and regular rhythm.     Heart sounds: Normal heart sounds. No murmur heard. Pulmonary:     Effort: Pulmonary effort is normal. No respiratory distress.     Breath sounds: Normal breath sounds. No wheezing.  Musculoskeletal:     Cervical back: Neck supple.  Skin:    General: Skin is warm and dry.  Neurological:     Mental Status: She is alert and oriented to person, place, and time.  Psychiatric:        Mood and Affect: Mood normal.        Behavior: Behavior normal.        Thought Content: Thought content normal.        Judgment: Judgment normal.      BP 116/78 (BP Location: Right Arm, Patient Position: Sitting, Cuff Size: Small)   Pulse (!) 104   Temp 98.6 F (37 C) (Oral)   Resp 16    Ht 5' 0.6" (1.539 m)   Wt 138 lb 12.8 oz (63 kg)   SpO2 99%   BMI 26.57 kg/m  Wt Readings from Last 3 Encounters:  01/27/24 138 lb 12.8 oz (63 kg)  10/04/23 143 lb 9.6 oz (65.1 kg)  09/13/23 142 lb (64.4 kg)       Assessment & Plan:   Problem List Items Addressed This Visit       Unprioritized   Type 2 diabetes mellitus with complication, without long-term current use of insulin (HCC) - Primary   Lab Results  Component Value Date   HGBA1C 7.0 (H) 01/13/2024   HGBA1C 6.7 (H) 10/04/2023   HGBA1C 6.4 (H) 06/10/2023   Lab Results  Component Value Date   MICROALBUR 0.2 06/10/2023   LDLCALC 89 01/13/2024   CREATININE 0.67 01/13/2024    A1c increased to 7.0%. Blood glucose levels within range. On semaglutide. Emphasized DM diet and lifestyle modifications for glycemic control. - Continue semaglutide. - Encourage regular exercise and dietary modifications. - Recheck A1c at the end of June.       Relevant Medications   rosuvastatin (CRESTOR) 40 MG tablet   Hypertension   Stable on metoprolol.       Relevant Medications   rosuvastatin (CRESTOR) 40 MG tablet   Hyperlipidemia   On crestor.  Lab Results  Component Value Date   CHOL 183 01/13/2024   HDL 60 01/13/2024   LDLCALC 89 01/13/2024   LDLDIRECT 99.0 10/29/2021   TRIG 245 (H) 01/13/2024   CHOLHDL 3.1 01/13/2024   LDL at goal. Continue same.       Relevant Medications  rosuvastatin (CRESTOR) 40 MG tablet   GERD (gastroesophageal reflux disease)   Stable on pepcid.        Relevant Medications   ondansetron (ZOFRAN-ODT) 8 MG disintegrating tablet   Other Visit Diagnoses       Breast cancer screening by mammogram       Relevant Orders   MM 3D SCREENING MAMMOGRAM BILATERAL BREAST     Nausea       Relevant Medications   ondansetron (ZOFRAN-ODT) 8 MG disintegrating tablet       I have discontinued Vickie Thompson's benzonatate, metaxalone, and methylPREDNISolone. I am also having her maintain her  Ascorbic Acid (VITAMIN C PO), (Zinc Acetate, Oral, (ZINC ACETATE PO)), OVER THE COUNTER MEDICATION, ibuprofen, metoprolol succinate, hydrocortisone, hydrocortisone, OneTouch Delica Lancets 33G, OneTouch Verio, OneTouch Verio Flex System, oseltamivir, desogestrel-ethinyl estradiol, Semaglutide (1 MG/DOSE), famotidine, metFORMIN, rosuvastatin, and ondansetron.  Meds ordered this encounter  Medications   rosuvastatin (CRESTOR) 40 MG tablet    Sig: Take 1 tablet (40 mg total) by mouth daily.    Dispense:  90 tablet    Refill:  0   ondansetron (ZOFRAN-ODT) 8 MG disintegrating tablet    Sig: Take 1 tablet (8 mg total) by mouth every 8 (eight) hours as needed for nausea or vomiting.    Dispense:  30 tablet    Refill:  1

## 2024-01-27 NOTE — Assessment & Plan Note (Addendum)
 Lab Results  Component Value Date   HGBA1C 7.0 (H) 01/13/2024   HGBA1C 6.7 (H) 10/04/2023   HGBA1C 6.4 (H) 06/10/2023   Lab Results  Component Value Date   MICROALBUR 0.2 06/10/2023   LDLCALC 89 01/13/2024   CREATININE 0.67 01/13/2024    A1c increased to 7.0%. Blood glucose levels within range. On semaglutide. Emphasized DM diet and lifestyle modifications for glycemic control. - Continue semaglutide. - Encourage regular exercise and dietary modifications. - Recheck A1c at the end of June.

## 2024-01-27 NOTE — Assessment & Plan Note (Addendum)
 On crestor.  Lab Results  Component Value Date   CHOL 183 01/13/2024   HDL 60 01/13/2024   LDLCALC 89 01/13/2024   LDLDIRECT 99.0 10/29/2021   TRIG 245 (H) 01/13/2024   CHOLHDL 3.1 01/13/2024   LDL at goal. Continue same.

## 2024-01-31 NOTE — Telephone Encounter (Signed)
 Electronic request made

## 2024-02-07 ENCOUNTER — Other Ambulatory Visit (HOSPITAL_BASED_OUTPATIENT_CLINIC_OR_DEPARTMENT_OTHER): Payer: Self-pay

## 2024-02-07 ENCOUNTER — Other Ambulatory Visit: Payer: Self-pay | Admitting: Family Medicine

## 2024-02-07 MED ORDER — OZEMPIC (1 MG/DOSE) 4 MG/3ML ~~LOC~~ SOPN
1.0000 mg | PEN_INJECTOR | SUBCUTANEOUS | 1 refills | Status: DC
  Filled 2024-02-07: qty 3, 28d supply, fill #0
  Filled 2024-03-14: qty 3, 28d supply, fill #1

## 2024-02-22 ENCOUNTER — Telehealth (HOSPITAL_BASED_OUTPATIENT_CLINIC_OR_DEPARTMENT_OTHER): Payer: Self-pay

## 2024-02-24 ENCOUNTER — Ambulatory Visit (AMBULATORY_SURGERY_CENTER)

## 2024-02-24 ENCOUNTER — Other Ambulatory Visit: Payer: Self-pay

## 2024-02-24 VITALS — Ht 60.0 in | Wt 139.0 lb

## 2024-02-24 DIAGNOSIS — Z1211 Encounter for screening for malignant neoplasm of colon: Secondary | ICD-10-CM

## 2024-02-24 MED ORDER — NA SULFATE-K SULFATE-MG SULF 17.5-3.13-1.6 GM/177ML PO SOLN: 1.0000 | Freq: Once | ORAL | 0 refills | Status: AC

## 2024-02-24 NOTE — Progress Notes (Signed)
 Denies allergies to eggs or soy products. Denies complication of anesthesia or sedation. Denies use of weight loss medication. Denies use of O2.   Emmi instructions given for colonoscopy.

## 2024-02-29 ENCOUNTER — Telehealth (HOSPITAL_BASED_OUTPATIENT_CLINIC_OR_DEPARTMENT_OTHER): Payer: Self-pay

## 2024-03-02 ENCOUNTER — Encounter: Payer: Self-pay | Admitting: Gastroenterology

## 2024-03-11 ENCOUNTER — Other Ambulatory Visit: Payer: Self-pay | Admitting: Family Medicine

## 2024-03-11 DIAGNOSIS — Z3009 Encounter for other general counseling and advice on contraception: Secondary | ICD-10-CM

## 2024-03-12 ENCOUNTER — Other Ambulatory Visit (HOSPITAL_BASED_OUTPATIENT_CLINIC_OR_DEPARTMENT_OTHER): Payer: Self-pay

## 2024-03-12 MED ORDER — DESOGESTREL-ETHINYL ESTRADIOL 0.15-0.02/0.01 MG (21/5) PO TABS
1.0000 | ORAL_TABLET | Freq: Every day | ORAL | 0 refills | Status: DC
  Filled 2024-03-12 – 2024-03-21 (×2): qty 84, 84d supply, fill #0

## 2024-03-14 ENCOUNTER — Telehealth: Payer: Self-pay | Admitting: Nurse Practitioner

## 2024-03-14 NOTE — Telephone Encounter (Signed)
 Inbound call from patient, states she is experieincing some constipation and would like to know if there is anything she can take prior to her upcoming procedure 5/23.

## 2024-03-14 NOTE — Telephone Encounter (Signed)
 Returned patient call.   Patient having constipation and colonoscopy is in 2 days.  Advised patient to do the 2 day prep and take 7 caps of Miralax  ithis evening n 32 ouncesc of beverage tonight after dinner, then progress to clear liquids. Patient verbalize understanding.

## 2024-03-14 NOTE — Telephone Encounter (Signed)
 Spoke with patient and discussed taking 7 capfuls of Miralax  in 32oz of beverage tonight and begin prep instructions and liquid diet tomorrow.

## 2024-03-15 ENCOUNTER — Other Ambulatory Visit: Payer: Self-pay

## 2024-03-15 ENCOUNTER — Other Ambulatory Visit (HOSPITAL_BASED_OUTPATIENT_CLINIC_OR_DEPARTMENT_OTHER): Payer: Self-pay

## 2024-03-16 ENCOUNTER — Encounter: Admitting: Gastroenterology

## 2024-03-16 ENCOUNTER — Encounter: Payer: Self-pay | Admitting: Gastroenterology

## 2024-03-16 ENCOUNTER — Ambulatory Visit (AMBULATORY_SURGERY_CENTER): Admitting: Gastroenterology

## 2024-03-16 VITALS — BP 104/68 | HR 90 | Temp 98.1°F | Resp 18 | Ht 60.5 in | Wt 139.0 lb

## 2024-03-16 DIAGNOSIS — K640 First degree hemorrhoids: Secondary | ICD-10-CM | POA: Diagnosis not present

## 2024-03-16 DIAGNOSIS — K644 Residual hemorrhoidal skin tags: Secondary | ICD-10-CM

## 2024-03-16 DIAGNOSIS — Z1211 Encounter for screening for malignant neoplasm of colon: Secondary | ICD-10-CM | POA: Diagnosis present

## 2024-03-16 MED ORDER — SODIUM CHLORIDE 0.9 % IV SOLN: 500.0000 mL | Freq: Once | INTRAVENOUS | Status: DC

## 2024-03-16 NOTE — Patient Instructions (Signed)

## 2024-03-16 NOTE — Progress Notes (Signed)
 GASTROENTEROLOGY PROCEDURE H&P NOTE   Primary Care Physician: Neda Balk, MD    Reason for Procedure:  Colon polyp surveillance  Plan:    Colonoscopy  Patient is appropriate for endoscopic procedure(s) in the ambulatory ([facility]) setting.  The nature of the procedure, as well as the risks, benefits, and alternatives were carefully and thoroughly reviewed with the patient. Ample time for discussion and questions allowed. The patient understood, was satisfied, and agreed to proceed.     HPI: Vickie Thompson is a 53 y.o. female who presents for colonoscopy for ongoing colon polyp surveillance and colon cancer screening.  No active GI symptoms.  No known family history of colon cancer or related malignancy.  Patient is otherwise without complaints or active issues today.  Last colonoscopy was 07/2018 and notable for 3 mm cecal sessile serrated polyp, 1 mm rectal hyperplastic polyp, internal hemorrhoids, with recommendation to repeat in 5 years.  Since then, has undergone hemorrhoid banding series.  Past Medical History:  Diagnosis Date   Acid reflux 09/16/2019   Allergy    Ceruminosis, bilateral 06/10/2023   Contraceptive management 07/13/2015   Contraceptive management 07/13/2015   Contraceptive management 07/13/2015   Menarche at 17 Regular and moderate flow No history of abnormal pap in past G2P2, s/p 2 SVD No history of abnormal MGM No concerns today No gyn surgeries LMP 12/15/2015, normal, 5 days    Cough 06/10/2023   GERD (gastroesophageal reflux disease)    H/O gestational diabetes mellitus, not currently pregnant    Hemorrhoid    Hyperglycemia 05/03/2017   Hyperhidrosis of axilla 06/10/2023   Hyperlipidemia    Hypertension    Knee pain 06/04/2018   Lateral epicondylitis, left elbow 01/12/2017   Left wrist pain 08/19/2019   Palpitations 01/14/2022   Preventative health care 01/05/2016   Rectal bleeding 06/04/2018   Right elbow pain 08/19/2019   Type 2 diabetes  mellitus with complication, without long-term current use of insulin (HCC) 01/14/2022   Vitamin D  deficiency 02/20/2012    Past Surgical History:  Procedure Laterality Date   NO PAST SURGERIES      Prior to Admission medications   Medication Sig Start Date End Date Taking? Authorizing Provider  Ascorbic Acid (VITAMIN C PO) Take 1 capsule by mouth daily.    [provider]  Blood Glucose Monitoring Suppl (ONETOUCH VERIO FLEX SYSTEM) w/Device KIT Use to check blood sugar once a day.  Dx code E11.8 10/05/23   Neda Balk, MD  desogestrel -ethinyl estradiol  (PIMTREA ) 0.15-0.02/0.01 MG (21/5) tablet Take 1 tablet by mouth daily. 03/12/24   Neda Balk, MD  famotidine  (PEPCID ) 40 MG tablet Take 1 tablet (40 mg total) by mouth at bedtime. *Need appointment for future refills.* 12/26/23   Neda Balk, MD  glucose blood (ONETOUCH VERIO) test strip Use to check blood sugar once a day. 10/05/23   Neda Balk, MD  hydrocortisone  (ANUSOL -HC) 25 MG suppository Unwrap and insert 1 suppository (25 mg total) rectally 2 (two) times daily. *Due for colonoscopy. Please call our office at [phone].* 09/08/23   Davaughn Hillyard V, DO  hydrocortisone  (PROCTOCARE-HC) 2.5 % rectal cream Place 1 Application rectally 2 (two) times daily. 09/08/23   Neda Balk, MD  ibuprofen  (ADVIL ) 800 MG tablet Take 800 mg by mouth 2 (two) times daily as needed for headache or mild pain (pain score 1-3).    [provider]  metFORMIN  (GLUCOPHAGE ) 500 MG tablet Take 1 tablet (500 mg total)  by mouth daily with breakfast. Patient not taking: Reported on 02/24/2024 01/12/24   Neda Balk, MD  metoprolol  succinate (TOPROL -XL) 50 MG 24 hr tablet Take 1 tablet (50 mg total) by mouth daily. Take with or immediately following a meal. 04/11/23   Krasowski, Robert J, MD  ondansetron  (ZOFRAN -ODT) 8 MG disintegrating tablet Take 1 tablet (8 mg total) by mouth every 8 (eight) hours as needed for nausea or  vomiting. 01/27/24   Dorrene Gaucher, NP  OneTouch Delica Lancets 33G MISC Use to check blood sugar once a day.  Dx code E11.8 10/05/23   Neda Balk, MD  OVER THE COUNTER MEDICATION Take 1 tablet by mouth every other day. Magnesium powder nightly Patient not taking: Reported on 02/24/2024    [provider]  rosuvastatin  (CRESTOR ) 40 MG tablet Take 1 tablet (40 mg total) by mouth daily. 01/27/24   Dorrene Gaucher, NP  Semaglutide , 1 MG/DOSE, (OZEMPIC , 1 MG/DOSE,) 4 MG/3ML SOPN Inject 1 mg as directed once a week. 02/07/24   Neda Balk, MD  Zinc Acetate, Oral, (ZINC ACETATE PO) Take 1 tablet by mouth daily.    [provider]    Current Outpatient Medications  Medication Sig Dispense Refill   Ascorbic Acid (VITAMIN C PO) Take 1 capsule by mouth daily.     Blood Glucose Monitoring Suppl (ONETOUCH VERIO FLEX SYSTEM) w/Device KIT Use to check blood sugar once a day.  Dx code E11.8 1 kit 0   desogestrel -ethinyl estradiol  (PIMTREA ) 0.15-0.02/0.01 MG (21/5) tablet Take 1 tablet by mouth daily. 84 tablet 0   famotidine  (PEPCID ) 40 MG tablet Take 1 tablet (40 mg total) by mouth at bedtime. *Need appointment for future refills.* 90 tablet 0   glucose blood (ONETOUCH VERIO) test strip Use to check blood sugar once a day. 100 each 1   hydrocortisone  (ANUSOL -HC) 25 MG suppository Unwrap and insert 1 suppository (25 mg total) rectally 2 (two) times daily. *Due for colonoscopy. Please call our office at [phone].* 12 suppository 1   hydrocortisone  (PROCTOCARE-HC) 2.5 % rectal cream Place 1 Application rectally 2 (two) times daily. 30 g 1   ibuprofen  (ADVIL ) 800 MG tablet Take 800 mg by mouth 2 (two) times daily as needed for headache or mild pain (pain score 1-3).     metFORMIN  (GLUCOPHAGE ) 500 MG tablet Take 1 tablet (500 mg total) by mouth daily with breakfast. (Patient not taking: Reported on 02/24/2024) 90 tablet 0   metoprolol  succinate (TOPROL -XL) 50 MG 24 hr tablet Take 1  tablet (50 mg total) by mouth daily. Take with or immediately following a meal. 90 tablet 3   ondansetron  (ZOFRAN -ODT) 8 MG disintegrating tablet Take 1 tablet (8 mg total) by mouth every 8 (eight) hours as needed for nausea or vomiting. 30 tablet 1   OneTouch Delica Lancets 33G MISC Use to check blood sugar once a day.  Dx code E11.8 100 each 1   OVER THE COUNTER MEDICATION Take 1 tablet by mouth every other day. Magnesium powder nightly (Patient not taking: Reported on 02/24/2024)     rosuvastatin  (CRESTOR ) 40 MG tablet Take 1 tablet (40 mg total) by mouth daily. 90 tablet 0   Semaglutide , 1 MG/DOSE, (OZEMPIC , 1 MG/DOSE,) 4 MG/3ML SOPN Inject 1 mg as directed once a week. 3 mL 1   Zinc Acetate, Oral, (ZINC ACETATE PO) Take 1 tablet by mouth daily.     No current facility-administered medications for this visit.    Allergies as of 03/16/2024 -  Review Complete 03/16/2024  Allergen Reaction Noted   Retin-a [tretinoin] Shortness Of Breath 02/24/2024   Retinoids Shortness Of Breath 02/07/2012   Sudafed [pseudoephedrine hcl] Palpitations 02/07/2012    Family History  Problem Relation Age of Onset   Mental illness Mother        depression   Diabetes Father    Hypertension Father    Hypertension Sister    Gout Brother    Cancer Maternal Aunt        lung   Colon cancer Neg Hx    Esophageal cancer Neg Hx    Rectal cancer Neg Hx    Stomach cancer Neg Hx     Social History   Socioeconomic History   Marital status: Married    Spouse name: Not on file   Number of children: 2   Years of education: Not on file   Highest education level: Not on file  Occupational History   Not on file  Tobacco Use   Smoking status: Never   Smokeless tobacco: Never  Vaping Use   Vaping status: Never Used  Substance and Sexual Activity   Alcohol use: Yes    Comment: occasionally   Drug use: No   Sexual activity: Yes    Partners: Male    Birth control/protection: Pill  Other Topics Concern   Not  on file  Social History Narrative   Not on file   Social Drivers of Health   Financial Resource Strain: Not on file  Food Insecurity: Not on file  Transportation Needs: Not on file  Physical Activity: Not on file  Stress: Not on file  Social Connections: Not on file  Intimate Partner Violence: Not on file    Physical Exam: Vital signs in last 24 hours: @LMP  02/19/2024  GEN: NAD EYE: Sclerae anicteric ENT: MMM CV: Non-tachycardic Pulm: CTA b/l GI: Soft, NT/ND NEURO:  Alert & Oriented x 3   Harry Lindau, DO [facility]   03/16/2024 10:49 AM

## 2024-03-16 NOTE — Op Note (Signed)
 [facility] Patient Name: Vickie Thompson Procedure Date: 03/16/2024 11:20 AM MRN: [id number] Endoscopist: Harry Lindau , MD, [id number] Age: 53 Referring MD:  Date of Birth: 29-Oct-1970 Gender: Female Account #: [id number] Procedure:                Colonoscopy Indications:              High risk colon cancer surveillance: Personal                            history of sessile serrated colon polyp (less than                            10 mm in size) with no dysplasia                           Last colonoscopy was 07/2018 and notable for 3 mm                            cecal sessile serrated polyp, 1 mm rectal                            hyperplastic polyp, internal hemorrhoids, with                            recommendation to repeat in 5 years. Medicines:                Monitored Anesthesia Care Procedure:                Pre-Anesthesia Assessment:                           - Prior to the procedure, a History and Physical                            was performed, and patient medications and                            allergies were reviewed. The patient's tolerance of                            previous anesthesia was also reviewed. The risks                            and benefits of the procedure and the sedation                            options and risks were discussed with the patient.                            All questions were answered, and informed consent                            was obtained. Prior Anticoagulants: The patient has  taken no anticoagulant or antiplatelet agents. ASA                            Grade Assessment: II - A patient with mild systemic                            disease. After reviewing the risks and benefits,                            the patient was deemed in satisfactory condition to                            undergo the procedure.                           After obtaining informed consent, the colonoscope                             was passed under direct vision. Throughout the                            procedure, the patient's blood pressure, pulse, and                            oxygen saturations were monitored continuously. The                            [id number]-[id number] Colonoscope [id number] was introduced                            through the anus and advanced to the the terminal                            ileum. The colonoscopy was performed without                            difficulty. The patient tolerated the procedure                            well. The quality of the bowel preparation was                            excellent. The terminal ileum, ileocecal valve,                            appendiceal orifice, and rectum were photographed. Scope In: 11:33:48 AM Scope Out: 11:45:27 AM Scope Withdrawal Time: 0 hours 9 minutes 13 seconds  Total Procedure Duration: 0 hours 11 minutes 39 seconds  Findings:                 Skin tags were found on perianal exam.                           The entire colon appeared normal.  A few small scars were found in the distal rectum                            consistent with prior hemorrhoid banding sites. The                            scar tissue was healthy in appearance.                           1 column of non-bleeding internal hemorrhoids were                            found during retroflexion. The hemorrhoids were                            small and Grade I (internal hemorrhoids that do not                            prolapse).                           The terminal ileum appeared normal. Complications:            No immediate complications. Estimated Blood Loss:     Estimated blood loss: none. Impression:               - Perianal skin tags found on perianal exam.                           - The entire examined colon is normal.                           - Scar in the distal rectum.                           -  Non-bleeding internal hemorrhoids.                           - The examined portion of the ileum was normal.                           - No specimens collected. Recommendation:           - Patient has a contact number available for                            emergencies. The signs and symptoms of potential                            delayed complications were discussed with the                            patient. Return to normal activities tomorrow.                            Written discharge instructions were provided to  the                            patient.                           - Resume previous diet.                           - Continue present medications.                           - Repeat colonoscopy in 7 years for surveillance.                           - Return to GI clinic PRN. Harry Lindau, MD 03/16/2024 11:55:31 AM

## 2024-03-16 NOTE — Progress Notes (Signed)
 Sedate, gd SR, tolerated procedure well, VSS, report to RN

## 2024-03-18 ENCOUNTER — Other Ambulatory Visit: Payer: Self-pay | Admitting: Family Medicine

## 2024-03-20 ENCOUNTER — Other Ambulatory Visit (HOSPITAL_BASED_OUTPATIENT_CLINIC_OR_DEPARTMENT_OTHER): Payer: Self-pay

## 2024-03-20 ENCOUNTER — Telehealth: Payer: Self-pay

## 2024-03-20 MED ORDER — FAMOTIDINE 40 MG PO TABS
40.0000 mg | ORAL_TABLET | Freq: Every day | ORAL | 3 refills | Status: DC
  Filled 2024-03-20: qty 90, 90d supply, fill #0
  Filled 2024-06-25: qty 90, 90d supply, fill #1

## 2024-03-20 NOTE — Telephone Encounter (Signed)
  Follow up Call-     03/16/2024   10:55 AM  Call back number  Post procedure Call Back phone  # [phone]  Permission to leave phone message Yes     Patient questions:  Do you have a fever, pain , or abdominal swelling? No. Pain Score  0 *  Have you tolerated food without any problems? Yes.    Have you been able to return to your normal activities? Yes.    Do you have any questions about your discharge instructions: Diet   No. Medications  No. Follow up visit  No.  Do you have questions or concerns about your Care? No.  Actions: * If pain score is 4 or above: No action needed, pain <4.

## 2024-03-21 ENCOUNTER — Other Ambulatory Visit (HOSPITAL_BASED_OUTPATIENT_CLINIC_OR_DEPARTMENT_OTHER): Payer: Self-pay

## 2024-04-02 ENCOUNTER — Ambulatory Visit (HOSPITAL_BASED_OUTPATIENT_CLINIC_OR_DEPARTMENT_OTHER)

## 2024-04-02 ENCOUNTER — Ambulatory Visit (HOSPITAL_BASED_OUTPATIENT_CLINIC_OR_DEPARTMENT_OTHER)
Admission: RE | Admit: 2024-04-02 | Discharge: 2024-04-02 | Disposition: A | Discharge status: Home / Self Care | Source: Ambulatory Visit | Attending: Family | Admitting: Family

## 2024-04-02 ENCOUNTER — Encounter (HOSPITAL_BASED_OUTPATIENT_CLINIC_OR_DEPARTMENT_OTHER): Payer: Self-pay

## 2024-04-02 DIAGNOSIS — Z1231 Encounter for screening mammogram for malignant neoplasm of breast: Secondary | ICD-10-CM | POA: Diagnosis not present

## 2024-04-23 ENCOUNTER — Other Ambulatory Visit: Payer: Self-pay | Admitting: Family Medicine

## 2024-04-23 ENCOUNTER — Other Ambulatory Visit: Payer: Self-pay

## 2024-04-23 ENCOUNTER — Other Ambulatory Visit (HOSPITAL_BASED_OUTPATIENT_CLINIC_OR_DEPARTMENT_OTHER): Payer: Self-pay

## 2024-04-23 MED ORDER — OZEMPIC (1 MG/DOSE) 4 MG/3ML ~~LOC~~ SOPN
1.0000 mg | PEN_INJECTOR | SUBCUTANEOUS | 0 refills | Status: DC
  Filled 2024-04-23: qty 3, 28d supply, fill #0

## 2024-04-25 ENCOUNTER — Other Ambulatory Visit: Payer: Self-pay | Admitting: Family

## 2024-04-25 ENCOUNTER — Other Ambulatory Visit: Payer: Self-pay | Admitting: Cardiology

## 2024-04-25 ENCOUNTER — Other Ambulatory Visit (HOSPITAL_BASED_OUTPATIENT_CLINIC_OR_DEPARTMENT_OTHER): Payer: Self-pay

## 2024-04-25 DIAGNOSIS — E782 Mixed hyperlipidemia: Secondary | ICD-10-CM

## 2024-04-25 MED ORDER — METOPROLOL SUCCINATE ER 50 MG PO TB24
50.0000 mg | ORAL_TABLET | Freq: Every day | ORAL | 0 refills | Status: DC
Start: 2024-04-25 — End: 2024-05-20
  Filled 2024-04-25: qty 30, 30d supply, fill #0

## 2024-04-25 MED ORDER — ROSUVASTATIN CALCIUM 40 MG PO TABS
40.0000 mg | ORAL_TABLET | Freq: Every day | ORAL | 0 refills | Status: DC
  Filled 2024-04-25: qty 30, 30d supply, fill #0
  Filled 2024-05-20: qty 30, 30d supply, fill #1
  Filled 2024-06-25: qty 30, 30d supply, fill #2

## 2024-05-16 ENCOUNTER — Other Ambulatory Visit (HOSPITAL_BASED_OUTPATIENT_CLINIC_OR_DEPARTMENT_OTHER): Payer: Self-pay

## 2024-05-16 ENCOUNTER — Other Ambulatory Visit: Payer: Self-pay | Admitting: Family Medicine

## 2024-05-16 MED ORDER — OZEMPIC (1 MG/DOSE) 4 MG/3ML ~~LOC~~ SOPN
1.0000 mg | PEN_INJECTOR | SUBCUTANEOUS | 1 refills | Status: DC
  Filled 2024-05-16: qty 3, 28d supply, fill #0
  Filled 2024-06-11: qty 3, 28d supply, fill #1

## 2024-05-20 ENCOUNTER — Other Ambulatory Visit: Payer: Self-pay | Admitting: Cardiology

## 2024-05-21 ENCOUNTER — Other Ambulatory Visit (HOSPITAL_BASED_OUTPATIENT_CLINIC_OR_DEPARTMENT_OTHER): Payer: Self-pay

## 2024-05-21 ENCOUNTER — Other Ambulatory Visit: Payer: Self-pay

## 2024-05-21 MED ORDER — METOPROLOL SUCCINATE ER 50 MG PO TB24
50.0000 mg | ORAL_TABLET | Freq: Every day | ORAL | 0 refills | Status: DC
  Filled 2024-05-21: qty 30, 30d supply, fill #0
  Filled 2024-06-25: qty 30, 30d supply, fill #1
  Filled 2024-07-27: qty 30, 30d supply, fill #2

## 2024-06-11 ENCOUNTER — Other Ambulatory Visit: Payer: Self-pay | Admitting: Family Medicine

## 2024-06-11 DIAGNOSIS — Z3009 Encounter for other general counseling and advice on contraception: Secondary | ICD-10-CM

## 2024-06-12 ENCOUNTER — Other Ambulatory Visit: Payer: Self-pay

## 2024-06-12 ENCOUNTER — Other Ambulatory Visit (HOSPITAL_BASED_OUTPATIENT_CLINIC_OR_DEPARTMENT_OTHER): Payer: Self-pay

## 2024-06-12 MED ORDER — DESOGESTREL-ETHINYL ESTRADIOL 0.15-0.02/0.01 MG (21/5) PO TABS
1.0000 | ORAL_TABLET | Freq: Every day | ORAL | 0 refills | Status: DC
  Filled 2024-06-12: qty 84, 84d supply, fill #0

## 2024-06-25 ENCOUNTER — Other Ambulatory Visit (HOSPITAL_BASED_OUTPATIENT_CLINIC_OR_DEPARTMENT_OTHER): Payer: Self-pay

## 2024-07-09 ENCOUNTER — Other Ambulatory Visit: Payer: Self-pay | Admitting: Family Medicine

## 2024-07-10 ENCOUNTER — Other Ambulatory Visit (HOSPITAL_BASED_OUTPATIENT_CLINIC_OR_DEPARTMENT_OTHER): Payer: Self-pay

## 2024-07-10 MED ORDER — OZEMPIC (1 MG/DOSE) 4 MG/3ML ~~LOC~~ SOPN
1.0000 mg | PEN_INJECTOR | SUBCUTANEOUS | 0 refills | Status: DC
  Filled 2024-07-10: qty 3, 28d supply, fill #0

## 2024-07-23 ENCOUNTER — Other Ambulatory Visit: Payer: Self-pay | Admitting: Family

## 2024-07-23 DIAGNOSIS — E782 Mixed hyperlipidemia: Secondary | ICD-10-CM

## 2024-07-24 ENCOUNTER — Other Ambulatory Visit (HOSPITAL_BASED_OUTPATIENT_CLINIC_OR_DEPARTMENT_OTHER): Payer: Self-pay

## 2024-07-24 MED ORDER — ROSUVASTATIN CALCIUM 40 MG PO TABS
40.0000 mg | ORAL_TABLET | Freq: Every day | ORAL | 0 refills | Status: DC
  Filled 2024-07-24: qty 30, 30d supply, fill #0

## 2024-08-12 ENCOUNTER — Other Ambulatory Visit: Payer: Self-pay | Admitting: Family Medicine

## 2024-08-13 ENCOUNTER — Other Ambulatory Visit (HOSPITAL_BASED_OUTPATIENT_CLINIC_OR_DEPARTMENT_OTHER): Payer: Self-pay

## 2024-08-13 MED ORDER — OZEMPIC (1 MG/DOSE) 4 MG/3ML ~~LOC~~ SOPN
1.0000 mg | PEN_INJECTOR | SUBCUTANEOUS | 0 refills | Status: DC
  Filled 2024-08-13: qty 3, 28d supply, fill #0

## 2024-08-24 ENCOUNTER — Other Ambulatory Visit: Payer: Self-pay | Admitting: Family Medicine

## 2024-08-24 ENCOUNTER — Other Ambulatory Visit: Payer: Self-pay | Admitting: Cardiology

## 2024-08-24 DIAGNOSIS — Z3009 Encounter for other general counseling and advice on contraception: Secondary | ICD-10-CM

## 2024-08-24 DIAGNOSIS — E782 Mixed hyperlipidemia: Secondary | ICD-10-CM

## 2024-08-27 ENCOUNTER — Other Ambulatory Visit (HOSPITAL_BASED_OUTPATIENT_CLINIC_OR_DEPARTMENT_OTHER): Payer: Self-pay

## 2024-08-27 ENCOUNTER — Other Ambulatory Visit: Payer: Self-pay | Admitting: Family Medicine

## 2024-08-27 ENCOUNTER — Other Ambulatory Visit: Payer: Self-pay

## 2024-08-27 DIAGNOSIS — Z3009 Encounter for other general counseling and advice on contraception: Secondary | ICD-10-CM

## 2024-08-27 MED ORDER — ROSUVASTATIN CALCIUM 40 MG PO TABS
40.0000 mg | ORAL_TABLET | Freq: Every day | ORAL | 0 refills | Status: DC
Start: 2024-08-27 — End: 2024-09-05
  Filled 2024-08-27: qty 30, 30d supply, fill #0

## 2024-08-27 MED ORDER — DESOGESTREL-ETHINYL ESTRADIOL 0.15-0.02/0.01 MG (21/5) PO TABS
1.0000 | ORAL_TABLET | Freq: Every day | ORAL | 0 refills | Status: DC
Start: 2024-08-27 — End: 2024-09-05
  Filled 2024-08-27: qty 28, 28d supply, fill #0

## 2024-08-27 MED ORDER — METOPROLOL SUCCINATE ER 50 MG PO TB24
50.0000 mg | ORAL_TABLET | Freq: Every day | ORAL | 0 refills | Status: DC
  Filled 2024-08-27 – 2024-08-31 (×2): qty 30, 30d supply, fill #0

## 2024-08-27 NOTE — Telephone Encounter (Signed)
 Copied from CRM [id number]. Topic: Clinical - Medication Refill >> Aug 27, 2024  9:04 AM Berneda FALCON wrote: Medication: desogestrel -ethinyl estradiol  (PIMTREA ) 0.15-0.02/0.01 MG (21/5) tablet  Patient has an appt next week and would like to know if she can get enough refilled until then. She is completely out  Has the patient contacted their pharmacy? Yes (Agent: If no, request that the patient contact the pharmacy for the refill. If patient does not wish to contact the pharmacy document the reason why and proceed with request.) (Agent: If yes, when and what did the pharmacy advise?)  This is the patient's preferred pharmacy:  [facility] [place] [facility] [address] [place] [address] Phone: [phone] Fax: [phone]  Is this the correct pharmacy for this prescription? Yes If no, delete pharmacy and type the correct one.   Has the prescription been filled recently? No  Is the patient out of the medication? Yes  Has the patient been seen for an appointment in the last year OR does the patient have an upcoming appointment? Yes  Can we respond through MyChart? Yes  Agent: Please be advised that Rx refills may take up to 3 business days. We ask that you follow-up with your pharmacy.

## 2024-08-31 ENCOUNTER — Other Ambulatory Visit (HOSPITAL_BASED_OUTPATIENT_CLINIC_OR_DEPARTMENT_OTHER): Payer: Self-pay

## 2024-08-31 NOTE — Progress Notes (Unsigned)
 Subjective:     Patient ID: Vickie Thompson, female    DOB: [birth year], 53 y.o.   MRN: [id number]  No chief complaint on file.   HPI  Discussed the use of AI scribe software for clinical note transcription with the patient, who gave verbal consent to proceed.  History of Present Illness      History of Present Illness Vickie Thompson is a 53 year old female with type 2 diabetes who presents for follow-up   Hx DM type 2, morning fasting blood sugar levels are around 130 mg/dL. She is on Ozempic  but has not lost weight in quite some times. Constipation is present, which she attributes to her medication regimen. Is not currently taking probiotic.  She experiences muscle and joint pain in her ankles, elbows, hips, and knees, with morning stiffness for about a month. There is no numbness, tingling, or family history of autoimmune diseases.  She is concerned about an odor from her right armpit, with no lesions or excessive sweating. She has a history of laser hair removal and waxing and has tried various deodorants without success.  HTN-metoprolol  50 mg daily  HLD- Crestor - 40 mg daily  Gerd-famotidine  40 mg daily  Diabetes: - Checking glucose at home: yes -Home BS- fasting AM 130s - Medications:  Ozempic  1 mg injection weekly - Compliant with medications - Denies symptoms of hypoglycemia, polyuria, polydipsia, numbness extremities, foot ulcers/trauma, visual changes, wounds that are not healing, medication side effects.  Pt compliant with medications, denies adverse Ses.    Wt Readings from Last 3 Encounters:  09/05/24 140 lb (63.5 kg)  03/16/24 139 lb (63 kg)  02/24/24 139 lb (63 kg)    Patient denies fever, chills, SOB, CP, palpitations, dyspnea, edema, HA, vision changes, N/V/D, abdominal pain, urinary symptoms, rash, weight changes, and recent illness or hospitalizations.     Health Maintenance Due  Topic Date Due   Pneumococcal Vaccine: 50+ Years (1 of 2 - PCV) Never  done   Hepatitis B Vaccines 19-59 Average Risk (1 of 3 - 19+ 3-dose series) Never done   OPHTHALMOLOGY EXAM  03/05/2023   Influenza Vaccine  05/25/2024   Diabetic kidney evaluation - Urine ACR  06/09/2024   HEMOGLOBIN A1C  07/15/2024    Past Medical History:  Diagnosis Date   Acid reflux 09/16/2019   Allergy    Ceruminosis, bilateral 06/10/2023   Contraceptive management 07/13/2015   Contraceptive management 07/13/2015   Contraceptive management 07/13/2015   Menarche at 17 Regular and moderate flow No history of abnormal pap in past G2P2, s/p 2 SVD No history of abnormal MGM No concerns today No gyn surgeries LMP 12/15/2015, normal, 5 days    Cough 06/10/2023   GERD (gastroesophageal reflux disease)    H/O gestational diabetes mellitus, not currently pregnant    Hemorrhoid    Hyperglycemia 05/03/2017   Hyperhidrosis of axilla 06/10/2023   Hyperlipidemia    Hypertension    Knee pain 06/04/2018   Lateral epicondylitis, left elbow 01/12/2017   Left wrist pain 08/19/2019   Palpitations 01/14/2022   Preventative health care 01/05/2016   Rectal bleeding 06/04/2018   Right elbow pain 08/19/2019   Type 2 diabetes mellitus with complication, without long-term current use of insulin (HCC) 01/14/2022   Vitamin D  deficiency 02/20/2012    Past Surgical History:  Procedure Laterality Date   NO PAST SURGERIES      Family History  Problem Relation Age of Onset   Mental illness Mother  depression   Diabetes Father    Hypertension Father    Hypertension Sister    Gout Brother    Cancer Maternal Aunt        lung   Colon cancer Neg Hx    Esophageal cancer Neg Hx    Rectal cancer Neg Hx    Stomach cancer Neg Hx     Social History   Socioeconomic History   Marital status: Married    Spouse name: Not on file   Number of children: 2   Years of education: Not on file   Highest education level: Not on file  Occupational History   Not on file  Tobacco Use   Smoking  status: Never   Smokeless tobacco: Never  Vaping Use   Vaping status: Never Used  Substance and Sexual Activity   Alcohol use: Yes    Comment: occasionally   Drug use: No   Sexual activity: Yes    Partners: Male    Birth control/protection: Pill  Other Topics Concern   Not on file  Social History Narrative   Not on file   Social Drivers of Health   Financial Resource Strain: Not on file  Food Insecurity: Not on file  Transportation Needs: Not on file  Physical Activity: Not on file  Stress: Not on file  Social Connections: Not on file  Intimate Partner Violence: Not on file    Outpatient Medications Prior to Visit  Medication Sig Dispense Refill   Ascorbic Acid (VITAMIN C PO) Take 1 capsule by mouth daily.     Blood Glucose Monitoring Suppl (ONETOUCH VERIO FLEX SYSTEM) w/Device KIT Use to check blood sugar once a day.  Dx code E11.8 1 kit 0   glucose blood (ONETOUCH VERIO) test strip Use to check blood sugar once a day. 100 each 1   hydrocortisone  (ANUSOL -HC) 25 MG suppository Unwrap and insert 1 suppository (25 mg total) rectally 2 (two) times daily. *Due for colonoscopy. Please call our office at [phone].* 12 suppository 1   hydrocortisone  (PROCTOCARE-HC) 2.5 % rectal cream Place 1 Application rectally 2 (two) times daily. 30 g 1   ibuprofen  (ADVIL ) 800 MG tablet Take 800 mg by mouth 2 (two) times daily as needed for headache or mild pain (pain score 1-3).     metoprolol  succinate (TOPROL -XL) 50 MG 24 hr tablet Take 1 tablet (50 mg total) by mouth daily. Take with or immediately following a meal. 30 tablet 0   ondansetron  (ZOFRAN -ODT) 8 MG disintegrating tablet Take 1 tablet (8 mg total) by mouth every 8 (eight) hours as needed for nausea or vomiting. 30 tablet 1   OneTouch Delica Lancets 33G MISC Use to check blood sugar once a day.  Dx code E11.8 100 each 1   OVER THE COUNTER MEDICATION Take 1 tablet by mouth every other day. Magnesium powder nightly (Patient not  taking: Reported on 03/16/2024)     Zinc Acetate, Oral, (ZINC ACETATE PO) Take 1 tablet by mouth daily.     desogestrel -ethinyl estradiol  (PIMTREA ) 0.15-0.02/0.01 MG (21/5) tablet Take 1 tablet by mouth daily. 28 tablet 0   famotidine  (PEPCID ) 40 MG tablet Take 1 tablet (40 mg total) by mouth at bedtime. 90 tablet 3   metFORMIN  (GLUCOPHAGE ) 500 MG tablet Take 1 tablet (500 mg total) by mouth daily with breakfast. (Patient not taking: Reported on 03/16/2024) 90 tablet 0   rosuvastatin  (CRESTOR ) 40 MG tablet Take 1 tablet (40 mg total) by mouth daily. Needs appt 30  tablet 0   Semaglutide , 1 MG/DOSE, (OZEMPIC , 1 MG/DOSE,) 4 MG/3ML SOPN Inject 1 mg as directed once a week. 3 mL 0   No facility-administered medications prior to visit.    Allergies  Allergen Reactions   Retin-A [Tretinoin] Shortness Of Breath   Retinoids Shortness Of Breath   Sudafed [Pseudoephedrine Hcl] Palpitations    ROS See HPI    Objective:    Physical Exam Vitals reviewed.  Constitutional:      General: She is not in acute distress.    Appearance: She is not toxic-appearing.  HENT:     Head: Normocephalic and atraumatic.     Mouth/Throat:     Mouth: Mucous membranes are moist.     Pharynx: Oropharynx is clear.  Eyes:     Extraocular Movements: Extraocular movements intact.     Pupils: Pupils are equal, round, and reactive to light.  Cardiovascular:     Rate and Rhythm: Normal rate and regular rhythm.     Pulses: Normal pulses.     Heart sounds: Normal heart sounds. No murmur heard. Pulmonary:     Effort: Pulmonary effort is normal. No respiratory distress.     Breath sounds: Normal breath sounds. No wheezing.  Musculoskeletal:        General: No swelling.     Cervical back: Neck supple.  Skin:    General: Skin is warm and dry.  Neurological:     General: No focal deficit present.     Mental Status: She is alert and oriented to person, place, and time.  Psychiatric:        Mood and Affect: Mood  normal.        Behavior: Behavior normal.        Thought Content: Thought content normal.        Judgment: Judgment normal.      BP 108/74   Pulse 87   Ht 5' 0.5 (1.537 m)   Wt 140 lb (63.5 kg)   SpO2 99%   BMI 26.89 kg/m  Wt Readings from Last 3 Encounters:  09/05/24 140 lb (63.5 kg)  03/16/24 139 lb (63 kg)  02/24/24 139 lb (63 kg)       Assessment & Plan:   Problem List Items Addressed This Visit     Arthralgia   Myalgia and arthralgia New onset muscle and joint pain in ankles, elbows, and hips x 1 month. No autoimmune or family history.  - Ordered rheumatoid factor, CRP, ANA, Vit D, Magnesium, thyroid  function tests, ANA, and magnesium levels.      Relevant Orders   Magnesium   TSH   C-reactive protein   Antinuclear Antib (ANA)   Sedimentation rate   Rheumatoid Factor   Vitamin D  (25 hydroxy)   Axillary odor   -Try clinical strength deodorant OTC, Pt shows examples during OV - Reapply deodorant during the day if needed. - Consider prescription-strength deodorant if no improvement.      Drug-induced constipation   Encouraged increased hydration and fiber in diet. Encourage daily probiotics. If bowels not moving can use MOM 2 tbls po in 4 oz of warm prune juice by mouth every 2-3 days. If no results then repeat in 4 hours with  Dulcolax suppository pr, may repeat again in 4 more hours as needed. Seek care if symptoms worsen. Consider daily Miralax  and/or Dulcolax if symptoms persist.        GERD (gastroesophageal reflux disease)   Stable on Pepcid   Relevant Medications   famotidine  (PEPCID ) 40 MG tablet   Hyperlipidemia   Tolerating Statin. Encourage heart healthy diet such as MIND or DASH diet, increase exercise, avoid trans fats, simple carbohydrates and processed foods, consider a krill or fish or flaxseed oil cap daily.        Relevant Medications   rosuvastatin  (CRESTOR ) 40 MG tablet   Other Relevant Orders   Comprehensive metabolic panel  with GFR   Hypertension   Well controlled, no changes to meds. Encouraged heart healthy diet such as the DASH diet and exercise as tolerated.        Relevant Medications   rosuvastatin  (CRESTOR ) 40 MG tablet   Other Relevant Orders   CBC with Differential/Platelet   Myalgia   Relevant Orders   Magnesium   TSH   C-reactive protein   Antinuclear Antib (ANA)   Sedimentation rate   Rheumatoid Factor   Vitamin D  (25 hydroxy)   Type 2 diabetes mellitus with complication, without long-term current use of insulin (HCC) - Primary   hgba1c acceptable, minimize simple carbs. Increase exercise as tolerated. Continue current meds       Relevant Medications   rosuvastatin  (CRESTOR ) 40 MG tablet   Semaglutide , 2 MG/DOSE, (OZEMPIC , 2 MG/DOSE,) 8 MG/3ML SOPN   Other Relevant Orders   Lipid panel   Comprehensive metabolic panel with GFR   Hemoglobin A1c   Microalbumin / creatinine urine ratio   Other Visit Diagnoses       Encounter for counseling regarding contraception       Relevant Medications   desogestrel -ethinyl estradiol  (PIMTREA ) 0.15-0.02/0.01 MG (21/5) tablet        I have discontinued Joliyah Lippens. [facility]'s metFORMIN  and Ozempic  (1 MG/DOSE). I have also changed her rosuvastatin . Additionally, I am having her start on Ozempic  (2 MG/DOSE). Lastly, I am having her maintain her Ascorbic Acid (VITAMIN C PO), (Zinc Acetate, Oral, (ZINC ACETATE PO)), OVER THE COUNTER MEDICATION, ibuprofen , hydrocortisone , hydrocortisone , OneTouch Delica Lancets 33G, OneTouch Verio, OneTouch Verio Flex System, ondansetron , metoprolol  succinate, desogestrel -ethinyl estradiol , and famotidine .  Meds ordered this encounter  Medications   desogestrel -ethinyl estradiol  (PIMTREA ) 0.15-0.02/0.01 MG (21/5) tablet    Sig: Take 1 tablet by mouth daily.    Dispense:  84 tablet    Refill:  1   rosuvastatin  (CRESTOR ) 40 MG tablet    Sig: Take 1 tablet (40 mg total) by mouth daily.    Dispense:  90 tablet    Refill:   0   Semaglutide , 2 MG/DOSE, (OZEMPIC , 2 MG/DOSE,) 8 MG/3ML SOPN    Sig: Inject 2 mg into the skin once a week.    Dispense:  3 mL    Refill:  3    Supervising Provider:   DOMENICA BLACKBIRD A [[id number]]   famotidine  (PEPCID ) 40 MG tablet    Sig: Take 1 tablet (40 mg total) by mouth at bedtime.    Dispense:  90 tablet    Refill:  3    Requested drug refills are authorized, however, the patient needs further evaluation and/or laboratory testing before further refills are given. Ask her to make an appointment for this.    Supervising Provider:   DOMENICA BLACKBIRD A [phone]

## 2024-08-31 NOTE — Assessment & Plan Note (Signed)
 hgba1c acceptable, minimize simple carbs. Increase exercise as tolerated. Continue current meds

## 2024-08-31 NOTE — Assessment & Plan Note (Addendum)
 Tolerating Statin. Encourage heart healthy diet such as MIND or DASH diet, increase exercise, avoid trans fats, simple carbohydrates and processed foods, consider a krill or fish or flaxseed oil cap daily.

## 2024-08-31 NOTE — Assessment & Plan Note (Signed)
 Well controlled, no changes to meds. Encouraged heart healthy diet such as the DASH diet and exercise as tolerated.

## 2024-08-31 NOTE — Assessment & Plan Note (Signed)
 Stable on Pepcid.

## 2024-09-05 ENCOUNTER — Other Ambulatory Visit (HOSPITAL_BASED_OUTPATIENT_CLINIC_OR_DEPARTMENT_OTHER): Payer: Self-pay

## 2024-09-05 ENCOUNTER — Ambulatory Visit: Admitting: Student

## 2024-09-05 ENCOUNTER — Encounter: Payer: Self-pay | Admitting: Student

## 2024-09-05 VITALS — BP 108/74 | HR 87 | Ht 60.5 in | Wt 140.0 lb

## 2024-09-05 DIAGNOSIS — E782 Mixed hyperlipidemia: Secondary | ICD-10-CM

## 2024-09-05 DIAGNOSIS — I10 Essential (primary) hypertension: Secondary | ICD-10-CM

## 2024-09-05 DIAGNOSIS — Z3009 Encounter for other general counseling and advice on contraception: Secondary | ICD-10-CM

## 2024-09-05 DIAGNOSIS — L748 Other eccrine sweat disorders: Secondary | ICD-10-CM

## 2024-09-05 DIAGNOSIS — M2550 Pain in unspecified joint: Secondary | ICD-10-CM

## 2024-09-05 DIAGNOSIS — Z0000 Encounter for general adult medical examination without abnormal findings: Secondary | ICD-10-CM

## 2024-09-05 DIAGNOSIS — E118 Type 2 diabetes mellitus with unspecified complications: Secondary | ICD-10-CM | POA: Diagnosis not present

## 2024-09-05 DIAGNOSIS — Z7985 Long-term (current) use of injectable non-insulin antidiabetic drugs: Secondary | ICD-10-CM

## 2024-09-05 DIAGNOSIS — K5903 Drug induced constipation: Secondary | ICD-10-CM

## 2024-09-05 DIAGNOSIS — M791 Myalgia, unspecified site: Secondary | ICD-10-CM

## 2024-09-05 DIAGNOSIS — K219 Gastro-esophageal reflux disease without esophagitis: Secondary | ICD-10-CM | POA: Diagnosis not present

## 2024-09-05 HISTORY — DX: Myalgia, unspecified site: M79.10

## 2024-09-05 HISTORY — DX: Other eccrine sweat disorders: L74.8

## 2024-09-05 HISTORY — DX: Pain in unspecified joint: M25.50

## 2024-09-05 HISTORY — DX: Drug induced constipation: K59.03

## 2024-09-05 LAB — COMPREHENSIVE METABOLIC PANEL WITH GFR
ALT: 24 U/L (ref 0–35)
AST: 25 U/L (ref 0–37)
Albumin: 4.6 g/dL (ref 3.5–5.2)
Alkaline Phosphatase: 67 U/L (ref 39–117)
BUN: 13 mg/dL (ref 6–23)
CO2: 27 meq/L (ref 19–32)
Calcium: 9.5 mg/dL (ref 8.4–10.5)
Chloride: 101 meq/L (ref 96–112)
Creatinine, Ser: 0.7 mg/dL (ref 0.40–1.20)
GFR: 99.31 mL/min (ref >60.00)
Glucose, Bld: 117 mg/dL — ABNORMAL HIGH (ref 70–99)
Potassium: 3.7 meq/L (ref 3.5–5.1)
Sodium: 137 meq/L (ref 135–145)
Total Bilirubin: 0.7 mg/dL (ref 0.2–1.2)
Total Protein: 7.6 g/dL (ref 6.0–8.3)

## 2024-09-05 LAB — SEDIMENTATION RATE: Sed Rate: 6 mm/h (ref 0–30)

## 2024-09-05 LAB — LIPID PANEL
Cholesterol: 171 mg/dL (ref 0–200)
HDL: 55.8 mg/dL (ref >39.00)
LDL Cholesterol: 66 mg/dL (ref 0–99)
NonHDL: 114.94
Total CHOL/HDL Ratio: 3
Triglycerides: 244 mg/dL — ABNORMAL HIGH (ref 0.0–149.0)
VLDL: 48.8 mg/dL — ABNORMAL HIGH (ref 0.0–40.0)

## 2024-09-05 LAB — CBC WITH DIFFERENTIAL/PLATELET
Basophils Absolute: 0.1 K/uL (ref 0.0–0.1)
Basophils Relative: 1.1 % (ref 0.0–3.0)
Eosinophils Absolute: 0.1 K/uL (ref 0.0–0.7)
Eosinophils Relative: 1.2 % (ref 0.0–5.0)
HCT: 43.6 % (ref 36.0–46.0)
Hemoglobin: 14.6 g/dL (ref 12.0–15.0)
Lymphocytes Relative: 26.3 % (ref 12.0–46.0)
Lymphs Abs: 1.6 K/uL (ref 0.7–4.0)
MCHC: 33.6 g/dL (ref 30.0–36.0)
MCV: 89.1 fl (ref 78.0–100.0)
Monocytes Absolute: 0.5 K/uL (ref 0.1–1.0)
Monocytes Relative: 8.7 % (ref 3.0–12.0)
Neutro Abs: 3.8 K/uL (ref 1.4–7.7)
Neutrophils Relative %: 62.7 % (ref 43.0–77.0)
Platelets: 294 K/uL (ref 150.0–400.0)
RBC: 4.89 Mil/uL (ref 3.87–5.11)
RDW: 13 % (ref 11.5–15.5)
WBC: 6.1 K/uL (ref 4.0–10.5)

## 2024-09-05 LAB — MICROALBUMIN / CREATININE URINE RATIO
Creatinine,U: 160.1 mg/dL
Microalb Creat Ratio: 146.1 mg/g — ABNORMAL HIGH (ref 0.0–30.0)
Microalb, Ur: 23.4 mg/dL — ABNORMAL HIGH (ref 0.0–1.9)

## 2024-09-05 LAB — VITAMIN D 25 HYDROXY (VIT D DEFICIENCY, FRACTURES): VITD: 37.7 ng/mL (ref 30.00–100.00)

## 2024-09-05 LAB — HEMOGLOBIN A1C: Hgb A1c MFr Bld: 6.9 % — ABNORMAL HIGH (ref 4.6–6.5)

## 2024-09-05 LAB — C-REACTIVE PROTEIN: CRP: <0.5 mg/dL (ref 0.5–20.0)

## 2024-09-05 LAB — TSH: TSH: 1.85 u[IU]/mL (ref 0.35–5.50)

## 2024-09-05 LAB — MAGNESIUM: Magnesium: 2.2 mg/dL (ref 1.5–2.5)

## 2024-09-05 MED ORDER — OZEMPIC (2 MG/DOSE) 8 MG/3ML ~~LOC~~ SOPN
2.0000 mg | PEN_INJECTOR | SUBCUTANEOUS | 3 refills | Status: AC
  Filled 2024-09-05: qty 3, 28d supply, fill #0
  Filled 2024-09-24 – 2024-09-26 (×2): qty 3, 28d supply, fill #1
  Filled 2024-10-23: qty 3, 28d supply, fill #2
  Filled 2024-11-22: qty 3, 28d supply, fill #3

## 2024-09-05 MED ORDER — ROSUVASTATIN CALCIUM 40 MG PO TABS
40.0000 mg | ORAL_TABLET | Freq: Every day | ORAL | 0 refills | Status: AC
  Filled 2024-09-05: qty 90, 90d supply, fill #0
  Filled 2024-09-27: qty 30, 30d supply, fill #0
  Filled 2024-10-23: qty 30, 30d supply, fill #1
  Filled 2024-11-22: qty 30, 30d supply, fill #2

## 2024-09-05 MED ORDER — DESOGESTREL-ETHINYL ESTRADIOL 0.15-0.02/0.01 MG (21/5) PO TABS
1.0000 | ORAL_TABLET | Freq: Every day | ORAL | 1 refills | Status: AC
  Filled 2024-09-05 – 2024-09-19 (×3): qty 84, 84d supply, fill #0
  Filled 2024-11-26 – 2024-11-29 (×2): qty 84, 84d supply, fill #1
  Filled ????-??-??: fill #1

## 2024-09-05 MED ORDER — FAMOTIDINE 40 MG PO TABS
40.0000 mg | ORAL_TABLET | Freq: Every day | ORAL | 3 refills | Status: AC
  Filled 2024-09-05 – 2024-09-27 (×2): qty 30, 30d supply, fill #0
  Filled 2024-10-23: qty 30, 30d supply, fill #1

## 2024-09-05 NOTE — Assessment & Plan Note (Signed)
 Encouraged increased hydration and fiber in diet. Encourage daily probiotics. If bowels not moving can use MOM 2 tbls po in 4 oz of warm prune juice by mouth every 2-3 days. If no results then repeat in 4 hours with  Dulcolax suppository pr, may repeat again in 4 more hours as needed. Seek care if symptoms worsen. Consider daily Miralax  and/or Dulcolax if symptoms persist.

## 2024-09-05 NOTE — Assessment & Plan Note (Addendum)
-  Try clinical strength deodorant OTC, Pt shows examples during OV - Reapply deodorant during the day if needed. - Consider prescription-strength deodorant if no improvement.

## 2024-09-05 NOTE — Assessment & Plan Note (Signed)
 Myalgia and arthralgia New onset muscle and joint pain in ankles, elbows, and hips x 1 month. No autoimmune or family history.  - Ordered rheumatoid factor, CRP, ANA, Vit D, Magnesium, thyroid  function tests, ANA, and magnesium levels.

## 2024-09-06 ENCOUNTER — Ambulatory Visit: Payer: Self-pay | Admitting: Student

## 2024-09-06 DIAGNOSIS — E559 Vitamin D deficiency, unspecified: Secondary | ICD-10-CM

## 2024-09-06 LAB — RHEUMATOID FACTOR: Rheumatoid fact SerPl-aCnc: 12 [IU]/mL (ref <14)

## 2024-09-06 LAB — ANA: Anti Nuclear Antibody (ANA): NEGATIVE

## 2024-09-06 MED ORDER — VITAMIN D3 25 MCG (1000 UT) PO CAPS: 1000.0000 [IU] | ORAL_CAPSULE | Freq: Every day | ORAL | Status: AC

## 2024-09-07 ENCOUNTER — Ambulatory Visit: Payer: Self-pay

## 2024-09-07 ENCOUNTER — Ambulatory Visit (INDEPENDENT_AMBULATORY_CARE_PROVIDER_SITE_OTHER): Admitting: Student

## 2024-09-07 ENCOUNTER — Encounter: Payer: Self-pay | Admitting: Student

## 2024-09-07 ENCOUNTER — Other Ambulatory Visit (HOSPITAL_BASED_OUTPATIENT_CLINIC_OR_DEPARTMENT_OTHER): Payer: Self-pay

## 2024-09-07 VITALS — BP 115/80 | HR 85 | Temp 98.1°F | Resp 16 | Ht 60.05 in | Wt 140.0 lb

## 2024-09-07 DIAGNOSIS — M25551 Pain in right hip: Secondary | ICD-10-CM | POA: Diagnosis not present

## 2024-09-07 MED ORDER — TRAMADOL HCL 50 MG PO TABS
50.0000 mg | ORAL_TABLET | Freq: Three times a day (TID) | ORAL | 0 refills | Status: AC | PRN
  Filled 2024-09-07: qty 15, 5d supply, fill #0

## 2024-09-07 NOTE — Telephone Encounter (Signed)
 Pt has scheduled OV.

## 2024-09-07 NOTE — Progress Notes (Signed)
   Acute Office Visit  Subjective:     Patient ID: Vickie Thompson, female    DOB: 03/10/1971, 53 y.o.   MRN: [id number]  Chief Complaint  Patient presents with   Hip Pain    Patient has right side hip pain, sharp radiating pains ; Started this morning ; Limping ; Started home pilates    HPI Patient is in today for acute visit.  History of Present Illness Vickie Thompson is a 53 year old female who presents with right hip pain.  She experiences severe right hip pain since 10 AM today, causing limping and difficulty walking. The pain is localized to the right hip and worsens with movement. She recently started a beginner's Pilates routine at home four days ago, involving position changes and stretching. Ibuprofen  600 mg and Tylenol  have not alleviated the pain. Pain is moderate to severe, worse with walking. There is no history of falls or injuries prior to the onset of hip pain.   Patient denies fever, chills, SOB, CP, palpitations, dyspnea, edema, HA, vision changes, N/V/D, abdominal pain, urinary symptoms, rash, weight changes, and recent illness or hospitalizations.    ROS  See HPI    Objective:    BP 115/80 (BP Location: Left Arm, Patient Position: Sitting, Cuff Size: Normal)   Pulse 85   Temp 98.1 F (36.7 C) (Oral)   Resp 16   Ht 5' 0.05 (1.525 m)   Wt 140 lb (63.5 kg)   SpO2 97%   BMI 27.30 kg/m    Physical Exam General: No acute distress. Awake and conversant.  Eyes: Normal conjunctiva, anicteric. Round symmetric pupils.  Respiratory: CTAB. Respirations are non-labored. No wheezing.  Skin: Warm. No rashes or ulcers.  Psych: Alert and oriented. Cooperative, Appropriate mood and affect, Normal judgment.  CV: RRR. No murmur. No lower extremity edema.  MSK: Normal ambulation. No clubbing or cyanosis.  Right hip: 5/5 strength, Full ROM though slightly limited r/t pain elicited. No visible swelling, erythema, or deformity noted. Left hip: 5/5 strength, Full  ROM   No results found for any visits on 09/07/24.      Assessment & Plan:   Problem List Items Addressed This Visit   None Visit Diagnoses       Right hip pain    -  Primary   Relevant Medications   traMADol  (ULTRAM ) 50 MG tablet      Acute right hip pain Likely muscle strain or tear from recent Pilates exercises. Ibuprofen  and Tylenol  ineffective. - Rx-tramadol  for pain management. - Continue ibuprofen  800 mg every 8 hours as needed. - Advised rest and may use ice application next 2 days, after this may use moist heat - Recommended avoiding overuse of hip or heavy physical activity until healed. - Encouraged warming up and  stretching before resuming exercise when healed    Meds ordered this encounter  Medications   traMADol  (ULTRAM ) 50 MG tablet    Sig: Take 1 tablet (50 mg total) by mouth every 8 (eight) hours as needed for up to 5 days.    Dispense:  15 tablet    Refill:  0    Supervising Provider:   DOMENICA BLACKBIRD A [[id number]]    No follow-ups on file.  Vickie Marik L Chia Mowers, NP

## 2024-09-07 NOTE — Telephone Encounter (Signed)
 FYI Only or Action Required?: FYI only for provider: appointment scheduled on this afternoon.  Patient was last seen in primary care on 09/05/2024 by Vickie Harlene CROME, NP.  Called Nurse Triage reporting Hip Pain - pain started suddenly this morning. Pt started pilates this week.  Symptoms began today.  Interventions attempted: OTC medications: IBU, tylenol , heat.  Symptoms are: unchanged.  Triage Disposition: See HCP Within 4 Hours (Or PCP Triage)  Patient/caregiver understands and will follow disposition?: Yes                   Copied from CRM #[id number]. Topic: Clinical - Red Word Triage >> Sep 07, 2024  2:47 PM Tysheama G wrote: Red Word that prompted transfer to Nurse Triage: Right hip in pain and she can barely walk Reason for Disposition  [1] SEVERE pain (e.g., excruciating, unable to do any normal activities) AND [2] not improved after 2 hours of pain medicine  Answer Assessment - Initial Assessment Questions 1. LOCATION and RADIATION: Where is the pain located? Does the pain spread (shoot) anywhere else?     Right hip 2. QUALITY: What does the pain feel like?  (e.g., sharp, dull, aching, burning)     sharp 3. SEVERITY: How bad is the pain? What does it keep you from doing?   (Scale 1-10; or mild, moderate, severe)     Start walking 7-8/10. After gets going - eases up 4. ONSET: When did the pain start? Does it come and go, or is it there all the time?     This morning 5. WORK OR EXERCISE: Has there been any recent work or exercise that involved this part of the body?      Pilates last night 6. CAUSE: What do you think is causing the hip pain?      unsure 7. AGGRAVATING FACTORS: What makes the hip pain worse? (e.g., walking, climbing stairs, running)     From sitting standing, and then walking 8. OTHER SYMPTOMS: Do you have any other symptoms? (e.g., back pain, pain shooting down leg,  fever, rash)     Pressure to hip  Protocols  used: Hip Pain-A-AH

## 2024-09-12 ENCOUNTER — Other Ambulatory Visit: Payer: Self-pay | Admitting: Cardiology

## 2024-09-12 ENCOUNTER — Other Ambulatory Visit (HOSPITAL_BASED_OUTPATIENT_CLINIC_OR_DEPARTMENT_OTHER): Payer: Self-pay

## 2024-09-14 ENCOUNTER — Other Ambulatory Visit (HOSPITAL_BASED_OUTPATIENT_CLINIC_OR_DEPARTMENT_OTHER): Payer: Self-pay

## 2024-09-19 ENCOUNTER — Other Ambulatory Visit (HOSPITAL_BASED_OUTPATIENT_CLINIC_OR_DEPARTMENT_OTHER): Payer: Self-pay

## 2024-09-21 ENCOUNTER — Other Ambulatory Visit (HOSPITAL_BASED_OUTPATIENT_CLINIC_OR_DEPARTMENT_OTHER): Payer: Self-pay

## 2024-09-24 ENCOUNTER — Other Ambulatory Visit (HOSPITAL_BASED_OUTPATIENT_CLINIC_OR_DEPARTMENT_OTHER): Payer: Self-pay

## 2024-09-27 ENCOUNTER — Other Ambulatory Visit (HOSPITAL_BASED_OUTPATIENT_CLINIC_OR_DEPARTMENT_OTHER): Payer: Self-pay

## 2024-09-27 ENCOUNTER — Other Ambulatory Visit: Payer: Self-pay | Admitting: Cardiology

## 2024-09-27 MED ORDER — METOPROLOL SUCCINATE ER 50 MG PO TB24
50.0000 mg | ORAL_TABLET | Freq: Every day | ORAL | 0 refills | Status: DC
  Filled 2024-09-27: qty 15, 15d supply, fill #0

## 2024-10-08 ENCOUNTER — Other Ambulatory Visit (HOSPITAL_BASED_OUTPATIENT_CLINIC_OR_DEPARTMENT_OTHER): Payer: Self-pay

## 2024-10-08 MED ORDER — AMOXICILLIN 500 MG PO CAPS
500.0000 mg | ORAL_CAPSULE | Freq: Three times a day (TID) | ORAL | 0 refills | Status: AC
  Filled 2024-10-08: qty 21, 7d supply, fill #0

## 2024-10-11 ENCOUNTER — Ambulatory Visit: Payer: Self-pay

## 2024-10-11 NOTE — Telephone Encounter (Signed)
 Pt refusing OV. She is requesting something for dizziness.

## 2024-10-11 NOTE — Telephone Encounter (Signed)
 FYI Only or Action Required?: Action required by provider: clinical question for provider and update on patient condition.  Patient was last seen in primary care on 09/07/2024 by [name], NP.  Called Nurse Triage reporting Dizziness.  Symptoms began yesterday.  Interventions attempted: Rest, hydration, or home remedies.  Symptoms are: improved currently compared to last night.  Triage Disposition: See HCP Within 4 Hours (Or PCP Triage)  Patient/caregiver understands and will follow disposition?: No, wishes to speak with PCP             Copied from CRM #[id number]. Topic: Clinical - Red Word Triage >> Oct 11, 2024  9:10 AM Roselie BROCKS wrote: [facility] that prompted transfer to Nurse Triage: patient states she is experiencing extreme dizziness,  and not able to sleep. Reason for Disposition  Taking a medicine that could cause dizziness (e.g., phenytoin [Dilantin], carbamazepine [Tegretol], primidone [Mysoline])  Answer Assessment - Initial Assessment Questions RN advised acute visit. Patient declines and states she is at work and it is very mild, not severe for her symptoms. She states she won't get off work til 4:15pm and would just like something non drowsy sent in for her dizziness. RN advised patient to consider an appointment or urgent care/ED. Patient declines.  1. DESCRIPTION: Describe your dizziness.     Room spinning sensation last night around 0930. She states this morning it is not a spinning sensation. More lightheaded this AM.  2. VERTIGO: Do you feel like either you or the room is spinning or tilting?      Yes.  3. LIGHTHEADED: Do you feel lightheaded? (e.g., somewhat faint, woozy, weak upon standing)     Yes.  4. SEVERITY: How bad is it?  Can you walk?     Mild to moderate she states, she can walk and is at work but did not drive, took an [employer]. No falling over or requiring assistance when walking.  5. ONSET:  When did the dizziness  begin?     Last night around 0930 pm.  6. AGGRAVATING FACTORS: Does anything make it worse? (e.g., standing, change in head position)     Standing.  7. CAUSE: What do you think is causing the dizziness?     She states she has not had enough sleep the last 3 days and thinks the lack of sleep could be a cause. She also thinks her recent increase in her Ozempic  could be causing it.  8. RECURRENT SYMPTOM: Have you had dizziness before? If Yes, ask: When was the last time? What happened that time?     Yes, she states about 30 years ago she had vertigo but that episode was much worse and had her vomiting.  9. OTHER SYMPTOMS: Do you have any other symptoms? (e.g., earache, headache, numbness, tinnitus, vomiting, weakness)     No vomiting, unilateral numbness or weakness, changes in speech or vision, headache, SOB.  Protocols used: Dizziness - Vertigo-A-AH

## 2024-10-15 NOTE — Telephone Encounter (Signed)
 Spoke w/ Pt- she is no longer having dizziness. Informed if dizziness returns can try otc meclizine but also recommend OV. Pt verbalized understanding.

## 2024-10-23 ENCOUNTER — Ambulatory Visit: Payer: Self-pay | Admitting: Student

## 2024-10-23 ENCOUNTER — Other Ambulatory Visit: Payer: Self-pay

## 2024-10-23 ENCOUNTER — Other Ambulatory Visit: Payer: Self-pay | Admitting: Cardiology

## 2024-10-23 ENCOUNTER — Other Ambulatory Visit (HOSPITAL_BASED_OUTPATIENT_CLINIC_OR_DEPARTMENT_OTHER): Payer: Self-pay

## 2024-10-23 DIAGNOSIS — M25551 Pain in right hip: Secondary | ICD-10-CM

## 2024-10-23 MED ORDER — METOPROLOL SUCCINATE ER 50 MG PO TB24
50.0000 mg | ORAL_TABLET | Freq: Every day | ORAL | 0 refills | Status: DC
  Filled 2024-10-23: qty 15, 15d supply, fill #0

## 2024-10-23 MED ORDER — NAPROXEN 500 MG PO TABS: 500.0000 mg | ORAL_TABLET | Freq: Two times a day (BID) | ORAL | 0 refills | Status: AC

## 2024-10-23 NOTE — Telephone Encounter (Signed)
 FYI Only or Action Required?: Action required by provider: medication refill request and update on patient condition. Patient requesting refill of naproxen  500 mg, not on profile and new RX of low dose flexeril    Patient was last seen in primary care on 09/07/2024 by [name], NP.  Called Nurse Triage reporting Hip Pain.  Symptoms began yesterday.  Interventions attempted: Prescription medications: naproxen  500 mg.  Symptoms are: unchanged.  Triage Disposition: Home Care  Patient/caregiver understands and will follow disposition?: Yes  Copied from CRM [id number] [id number]. Topic: Clinical - Red Word Triage >> Oct 23, 2024  9:17 AM Robinson H wrote: [facility] that prompted transfer to Nurse Triage: Right hip muscle spasm, pain hard to bare down limited movement in hip or bend over to put socks on and Naproxin not helping, started yesterday morning. Patient wants a callback at work and can't hold. Reason for Disposition  [1] Hip pain AND [2] from overuse or strain (e.g., vigorous activity, running)  Answer Assessment - Initial Assessment Questions Previously seen for hip pain on 09/05/2024.  Given naproxen  and hip pain resolved.  Now pain has returned  1. LOCATION and RADIATION: Where is the pain located? Does the pain spread (shoot) anywhere else?     Right hip pain 2. QUALITY: What does the pain feel like?  (e.g., sharp, dull, aching, burning)     Spasm like pain  3. SEVERITY: How bad is the pain? What does it keep you from doing?   (Scale 1-10; or mild, moderate, severe)     5/10 4. ONSET: When did the pain start? Does it come and go, or is it there all the time?     One day ago 5. WORK OR EXERCISE: Has there been any recent work or exercise that involved this part of the body?      Increased work around the house 6. CAUSE: What do you think is causing the hip pain?      Unsure, maybe flare of previous pain or new issue 7. AGGRAVATING FACTORS: What makes the  hip pain worse? (e.g., walking, climbing stairs, running)     Bending forward to put on socks 8. OTHER SYMPTOMS: Do you have any other symptoms? (e.g., back pain, pain shooting down leg,  fever, rash)     denies  Protocols used: Hip Pain-A-AH

## 2024-10-23 NOTE — Telephone Encounter (Signed)
 Patient was advised that medication send into pharmacy and she declined the referral for now but will send a message when ready to see sports medicine. Just an FYI

## 2024-11-06 ENCOUNTER — Telehealth: Payer: Self-pay | Admitting: Cardiology

## 2024-11-06 NOTE — Telephone Encounter (Signed)
 Patient is requesting a refill on her Metoprolol . CB # [phone]

## 2024-11-08 ENCOUNTER — Other Ambulatory Visit (HOSPITAL_BASED_OUTPATIENT_CLINIC_OR_DEPARTMENT_OTHER): Payer: Self-pay

## 2024-11-08 MED ORDER — METOPROLOL SUCCINATE ER 50 MG PO TB24
50.0000 mg | ORAL_TABLET | Freq: Every day | ORAL | 0 refills | Status: AC
  Filled 2024-11-08: qty 30, 30d supply, fill #0

## 2024-11-08 NOTE — Telephone Encounter (Signed)
 Pt scheduled to see Dr. Jory 12/05/25, refill sent.

## 2024-11-21 DIAGNOSIS — T7840XA Allergy, unspecified, initial encounter: Secondary | ICD-10-CM | POA: Insufficient documentation

## 2024-11-26 ENCOUNTER — Other Ambulatory Visit (HOSPITAL_BASED_OUTPATIENT_CLINIC_OR_DEPARTMENT_OTHER): Payer: Self-pay

## 2024-11-30 ENCOUNTER — Other Ambulatory Visit (HOSPITAL_BASED_OUTPATIENT_CLINIC_OR_DEPARTMENT_OTHER): Payer: Self-pay

## 2024-12-05 ENCOUNTER — Ambulatory Visit: Admitting: Cardiology

## 2025-03-06 ENCOUNTER — Encounter: Admitting: Student

## 2025-06-10 ENCOUNTER — Encounter: Admitting: Family Medicine
